# Patient Record
Sex: Male | Born: 1980 | Race: White | Hispanic: No | Smoking: Former smoker
Health system: Southern US, Community
[De-identification: ages and names within clinical notes are randomized; demographics above are authoritative.]

## PROBLEM LIST (undated history)

## (undated) DIAGNOSIS — G8929 Other chronic pain: Secondary | ICD-10-CM

## (undated) DIAGNOSIS — F32A Depression, unspecified: Secondary | ICD-10-CM

## (undated) DIAGNOSIS — E079 Disorder of thyroid, unspecified: Secondary | ICD-10-CM

## (undated) DIAGNOSIS — M25551 Pain in right hip: Secondary | ICD-10-CM

## (undated) DIAGNOSIS — R569 Unspecified convulsions: Secondary | ICD-10-CM

## (undated) HISTORY — DX: Depression, unspecified: F32.A

## (undated) HISTORY — PX: EYE SURGERY: SHX253

---

## 2011-10-30 ENCOUNTER — Inpatient Hospital Stay (HOSPITAL_COMMUNITY)
Admission: EM | Admit: 2011-10-30 | Discharge: 2011-11-02 | DRG: 101 | Disposition: A | Discharge status: Home / Self Care | Payer: MEDICAID | Attending: Internal Medicine | Admitting: Internal Medicine

## 2011-10-30 ENCOUNTER — Emergency Department (HOSPITAL_COMMUNITY): Payer: Self-pay

## 2011-10-30 ENCOUNTER — Encounter (HOSPITAL_COMMUNITY): Payer: Self-pay

## 2011-10-30 DIAGNOSIS — Z560 Unemployment, unspecified: Secondary | ICD-10-CM

## 2011-10-30 DIAGNOSIS — R6889 Other general symptoms and signs: Secondary | ICD-10-CM | POA: Diagnosis present

## 2011-10-30 DIAGNOSIS — T420X1A Poisoning by hydantoin derivatives, accidental (unintentional), initial encounter: Secondary | ICD-10-CM

## 2011-10-30 DIAGNOSIS — E039 Hypothyroidism, unspecified: Secondary | ICD-10-CM

## 2011-10-30 DIAGNOSIS — E109 Type 1 diabetes mellitus without complications: Secondary | ICD-10-CM | POA: Diagnosis present

## 2011-10-30 DIAGNOSIS — E162 Hypoglycemia, unspecified: Secondary | ICD-10-CM | POA: Diagnosis present

## 2011-10-30 DIAGNOSIS — G40802 Other epilepsy, not intractable, without status epilepticus: Principal | ICD-10-CM | POA: Diagnosis present

## 2011-10-30 DIAGNOSIS — E1065 Type 1 diabetes mellitus with hyperglycemia: Secondary | ICD-10-CM | POA: Diagnosis not present

## 2011-10-30 DIAGNOSIS — R569 Unspecified convulsions: Secondary | ICD-10-CM | POA: Diagnosis present

## 2011-10-30 DIAGNOSIS — B353 Tinea pedis: Secondary | ICD-10-CM | POA: Diagnosis present

## 2011-10-30 DIAGNOSIS — T420X5A Adverse effect of hydantoin derivatives, initial encounter: Secondary | ICD-10-CM | POA: Diagnosis present

## 2011-10-30 DIAGNOSIS — Y92009 Unspecified place in unspecified non-institutional (private) residence as the place of occurrence of the external cause: Secondary | ICD-10-CM

## 2011-10-30 DIAGNOSIS — D649 Anemia, unspecified: Secondary | ICD-10-CM | POA: Diagnosis present

## 2011-10-30 DIAGNOSIS — Z87891 Personal history of nicotine dependence: Secondary | ICD-10-CM

## 2011-10-30 DIAGNOSIS — G40309 Generalized idiopathic epilepsy and epileptic syndromes, not intractable, without status epilepticus: Secondary | ICD-10-CM

## 2011-10-30 DIAGNOSIS — IMO0002 Reserved for concepts with insufficient information to code with codable children: Secondary | ICD-10-CM | POA: Diagnosis not present

## 2011-10-30 DIAGNOSIS — Z794 Long term (current) use of insulin: Secondary | ICD-10-CM

## 2011-10-30 HISTORY — DX: Unspecified convulsions: R56.9

## 2011-10-30 HISTORY — DX: Disorder of thyroid, unspecified: E07.9

## 2011-10-30 LAB — BASIC METABOLIC PANEL
Calcium: 9 mg/dL (ref 8.4–10.5)
GFR calc Af Amer: >90 mL/min (ref >90)
GFR calc non Af Amer: >90 mL/min (ref >90)
Sodium: 135 mEq/L (ref 135–145)

## 2011-10-30 LAB — URINALYSIS, ROUTINE W REFLEX MICROSCOPIC
Glucose, UA: >1000 mg/dL — AB
Hgb urine dipstick: NEGATIVE
Specific Gravity, Urine: 1.01 (ref 1.005–1.030)
Urobilinogen, UA: 0.2 mg/dL (ref 0.0–1.0)

## 2011-10-30 LAB — HEPATIC FUNCTION PANEL
Albumin: 4.2 g/dL (ref 3.5–5.2)
Bilirubin, Direct: <0.1 mg/dL (ref 0.0–0.3)
Total Bilirubin: 0.2 mg/dL — ABNORMAL LOW (ref 0.3–1.2)

## 2011-10-30 LAB — GLUCOSE, CAPILLARY: Glucose-Capillary: 199 mg/dL — ABNORMAL HIGH (ref 70–99)

## 2011-10-30 LAB — URINE MICROSCOPIC-ADD ON

## 2011-10-30 LAB — CBC
MCHC: 34.3 g/dL (ref 30.0–36.0)
Platelets: 148 10*3/uL — ABNORMAL LOW (ref 150–400)
RDW: 12.6 % (ref 11.5–15.5)
WBC: 9.3 10*3/uL (ref 4.0–10.5)

## 2011-10-30 LAB — PHENYTOIN LEVEL, TOTAL: Phenytoin Lvl: 39.6 ug/mL (ref 10.0–20.0)

## 2011-10-30 MED ORDER — POTASSIUM CHLORIDE IN NACL 20-0.9 MEQ/L-% IV SOLN
INTRAVENOUS | Status: DC
  Administered 2011-10-30 – 2011-11-02 (×5): via INTRAVENOUS

## 2011-10-30 MED ORDER — CLOTRIMAZOLE 1 % EX CREA
TOPICAL_CREAM | CUTANEOUS | Status: AC
  Filled 2011-10-30: qty 15

## 2011-10-30 MED ORDER — ENOXAPARIN SODIUM 40 MG/0.4ML ~~LOC~~ SOLN
40.0000 mg | SUBCUTANEOUS | Status: DC
  Administered 2011-10-31 – 2011-11-02 (×3): 40 mg via SUBCUTANEOUS
  Filled 2011-10-30 (×3): qty 0.4

## 2011-10-30 MED ORDER — ONDANSETRON HCL 4 MG/2ML IJ SOLN: 4.0000 mg | Freq: Three times a day (TID) | INTRAMUSCULAR | Status: DC | PRN

## 2011-10-30 MED ORDER — LEVOTHYROXINE SODIUM 50 MCG PO TABS
50.0000 ug | ORAL_TABLET | Freq: Every day | ORAL | Status: DC
  Administered 2011-10-31 – 2011-11-01 (×2): 50 ug via ORAL
  Filled 2011-10-30 (×3): qty 1

## 2011-10-30 MED ORDER — BISACODYL 5 MG PO TBEC: 5.0000 mg | DELAYED_RELEASE_TABLET | Freq: Every day | ORAL | Status: DC | PRN

## 2011-10-30 MED ORDER — SODIUM CHLORIDE 0.9 % IV SOLN: INTRAVENOUS | Status: DC

## 2011-10-30 MED ORDER — SODIUM CHLORIDE 0.9 % IV BOLUS (SEPSIS)
1000.0000 mL | Freq: Once | INTRAVENOUS | Status: AC
  Administered 2011-10-30: 1000 mL via INTRAVENOUS

## 2011-10-30 MED ORDER — INSULIN GLARGINE 100 UNIT/ML ~~LOC~~ SOLN
25.0000 [IU] | Freq: Every day | SUBCUTANEOUS | Status: DC
  Administered 2011-10-30: 25 [IU] via SUBCUTANEOUS

## 2011-10-30 MED ORDER — CLOTRIMAZOLE 1 % EX CREA
TOPICAL_CREAM | Freq: Two times a day (BID) | CUTANEOUS | Status: DC
  Administered 2011-10-30 – 2011-11-02 (×6): via TOPICAL
  Filled 2011-10-30: qty 15

## 2011-10-30 MED ORDER — TRAZODONE HCL 50 MG PO TABS: 25.0000 mg | ORAL_TABLET | Freq: Every evening | ORAL | Status: DC | PRN

## 2011-10-30 MED ORDER — ONDANSETRON HCL 4 MG/2ML IJ SOLN: 4.0000 mg | INTRAMUSCULAR | Status: DC | PRN

## 2011-10-30 MED ORDER — ACETAMINOPHEN 325 MG PO TABS: 650.0000 mg | ORAL_TABLET | Freq: Four times a day (QID) | ORAL | Status: DC | PRN

## 2011-10-30 NOTE — ED Notes (Signed)
Per ems, called out for pt having possible seizure. States when they arrived pt was alert ad oriented but his blood sugar was 56. Pt given 25 grams of D50 by EMS. Pt alert and oriented on arrival to ED. Pt does have hx of seizures

## 2011-10-30 NOTE — H&P (Signed)
PCP:  [facility] health department   Chief Complaint:  Recurrent seizures  HPI: Kevin Cooper is an 31 y.o. male.  Diabetes type 1 since age 59, reports adequate control on nightly Lantus, with sliding scale 70/30 insulin; denies hypoglycemic episodes except after a seizure.  Denies family history of seizure disorder, but started having what appeared to be grand mal on April 8 this year, and was admitted total [facility] and evaluated for 3 days including negative CT scan and MRI of brain.   He was discharged on Dilantin 100 mg 3 times daily and followup at the [facility] Department within a couple of weeks and his nighttime dose was increased to 200 mg.Despite receiving a total of 400 mg per 24 hours patient continues to have breakthrough seizures pretty much once per week. Says that family members reports him as going into a stiff arching posture followed by generalized shaking, after the seizures his blood sugars are usually found be low, and he has been told that this is a result of the seizures.  Today family members became concerned because he was not answering his phone, and when they visited his home he was found to be confused and was felt to have had a seizure. He was brought to the emergency room and is now alert and oriented, has no memory of anything that happened today. There is no evidence of intraoral trauma, nor incontinence of urine, but she does have a fresh abrasion of the right proximal forearm  Emergency room his Dilantin level was found to the markedly elevated at 39  He denies headaches or fevers. Denies illicit drug use. 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  As well as diabetes type 1 patient was also diagnosed with hypothyroidism at an early age.   Rewiew of Systems:  The patient denies anorexia, fever, weight loss,, vision loss, decreased hearing, hoarseness, chest pain, syncope, dyspnea on exertion,  peripheral edema, balance deficits, hemoptysis, abdominal pain, melena, hematochezia, severe indigestion/heartburn, hematuria, incontinence, genital sores, muscle weakness, suspicious skin lesions, transient blindness, difficulty walking, depression, unusual weight change, abnormal bleeding, enlarged lymph nodes, angioedema, and breast masses.   Past Medical History  Diagnosis Date  . Seizures   . Thyroid disease   . Diabetes mellitus     History reviewed. No pertinent past surgical history.  Medications:  HOME MEDS: Prior to Admission medications   Medication Sig Start Date End Date Taking? Authorizing Provider  insulin aspart protamine-insulin aspart (NOVOLOG 70/30) (70-30) 100 UNIT/ML injection Inject 10-20 Units into the skin as directed. Per sliding scale instructions   Yes Historical Provider, MD  insulin glargine (LANTUS) 100 UNIT/ML injection Inject 25 Units into the skin at bedtime.   Yes Historical Provider, MD  levothyroxine (SYNTHROID, LEVOTHROID) 50 MCG tablet Take 50 mcg by mouth daily.   Yes Historical Provider, MD  phenytoin (DILANTIN) 100 MG ER capsule Take 100-200 mg by mouth 3 (three) times daily. Take one capsule in the morning and afternoon, then two capsules in the evening   Yes Historical Provider, MD     Allergies:  Allergies  Allergen Reactions  . Penicillins Other (See Comments)    UNKNOWN REACTION  . Tomato Hives, Itching and Rash    Social History:   does not have a smoking history on file. He does not have any smokeless tobacco history on file. He reports that he does not drink alcohol or use illicit drugs.  Family History: No family history on file.   Physical Exam: Filed Vitals:   10/30/11 1731 10/30/11 1736  BP: 129/75   Pulse: 79   Temp: 98.1 F (36.7 C)   TempSrc: Oral   Resp: 20   Height:  5\' 5"  (1.651 m)  Weight:  61.236 kg (135 lb)  SpO2: 100%    Blood pressure 129/75, pulse 79, temperature 98.1 F (36.7 C), temperature source  Oral, resp. rate 20, height 5\' 5"  (1.651 m), weight 61.236 kg (135 lb), SpO2 100.00%.  GEN:  Pleasant young Caucasian gentleman lying in the stretcher in no acute distress; cooperative with exam; looks younger than his stated age; significant large bore piercings of both ears and lip.  PSYCH:  alert and oriented x4; does not appear anxious or depressed; affect is appropriate. HEENT: Mucous membranes pink and anicteric; PERRLA; EOM intact; no cervical lymphadenopathy nor thyromegaly or carotid bruit; no JVD; no Breasts:: Not examined CHEST WALL: No tenderness CHEST: Normal respiration, clear to auscultation bilaterally HEART: Regular rate and rhythm; no murmurs rubs or gallops BACK: No kyphosis or scoliosis; no CVA tenderness ABDOMEN: soft non-tender; no masses, no organomegaly, normal abdominal bowel sounds; no pannus; no intertriginous candida. Rectal Exam: Not done EXTREMITIES: No bone or joint deformity; official abrasion of the right proximal forearm; no edema; no ulcerations. The feet are foul-smelling and dirty, he is wearing sandal and there is evidence of tinea pedis Genitalia: not examined PULSES: 2+ and symmetric SKIN: Normal hydration no rash or ulceration; extensively tattooed CNS: Cranial nerves 2-12 grossly intact no focal lateralizing neurologic deficit   Labs & Imaging Results for orders placed during the hospital encounter of 10/30/11 (from the past 48 hour(s))  BASIC METABOLIC PANEL     Status: Abnormal   Collection Time   10/30/11  5:56 PM      Component Value Range Comment   Sodium 135  135 - 145 (mEq/L)    Potassium 3.9  3.5 - 5.1 (mEq/L)    Chloride 95 (*) 96 - 112 (mEq/L)    CO2 30  19 - 32 (mEq/L)    Glucose, Bld 249 (*) 70 - 99 (mg/dL)    BUN 11  6 - 23 (mg/dL)    Creatinine, Ser 4.78  0.50 - 1.35 (mg/dL)    Calcium 9.0  8.4 - 10.5 (mg/dL)    GFR calc non Af Amer >90  >90 (mL/min)    GFR calc Af Amer >90  >90 (mL/min)   PHENYTOIN LEVEL, TOTAL     Status:  Abnormal   Collection Time   10/30/11  5:56 PM      Component Value Range Comment   Phenytoin Lvl 39.6 (*) 10.0 - 20.0 (ug/mL)   GLUCOSE, CAPILLARY     Status: Abnormal   Collection Time   10/30/11  6:47 PM      Component Value Range Comment   Glucose-Capillary 199 (*) 70 - 99 (mg/dL)    Ct Head Wo Contrast  10/30/2011  *RADIOLOGY REPORT*  Clinical Data: Seizure.  Headache.  CT HEAD WITHOUT CONTRAST  Technique:  Contiguous axial images were obtained from the base of the skull through the vertex without contrast.  Comparison: CT head 09/03/2011.  MRI of the brain 09/04/2011.  Findings: No acute intracranial abnormality.  Specifically, no evidence of acute hemorrhage, no signs of acute/subacute cerebral ischemia, no focal mass, mass effect, hydrocephalus or abnormal intra or extra-axial fluid collections.  No acute displaced skull fractures are identified.  Visualized  paranasal sinuses and mastoids are well pneumatized.  IMPRESSION: 1.  No acute intracranial abnormalities. 2.  The appearance the brain is normal.  Original Report Authenticated By: Florencia Reasons, M.D.      Assessment Present on Admission:  .Convulsions/seizures ? Questionable recent onset epileptic seizures; questionable hypoglycemic seizures.  .Dilantin toxicity .Diabetes type 1, controlled .Hypothyroidism .Tinea pedis   PLAN: We'll admit this gentleman to allow his Dilantin level to normalize, and consult neurologist for alternative management of his seizure disorder.  We'll discontinue 70/30 sliding scale while in hospital, maintained the same dose of evening Lantus, and give sliding scale with meal time coverage to assess his insulin needs. Because of the question of hypoglycemia will consider consulting Dr. Cathlyn Parsons for assistance with management.  Lotrimin for tinea pedis, and explanation on its application Other plans as per orders.   Delorise Hunkele 10/30/2011, 8:37 PM

## 2011-10-30 NOTE — ED Notes (Signed)
CRITICAL VALUE ALERT  Critical value received:  Phenytoin 39.6  Date of notification:  10/30/11  Time of notification:  1847  Critical value read back:yes  Nurse who received alert:  Tarri Glenn RN  MD notified (1st page):  Dr Manus Gunning  Time of first page:  Avon Gully  MD notified (2nd page):  Time of second page:  Responding MD:  Dr Manus Gunning  Time MD responded:  [phone]

## 2011-10-30 NOTE — ED Provider Notes (Signed)
History  This chart was scribed for Kevin Octave, MD by Bennett Scrape. This patient was seen in room [facility] and the patient's care was started at 5:38PM.  CSN: [id number]  Arrival date & time 10/30/11  1730   First MD Initiated Contact with Patient 10/30/11 1738      No chief complaint on file.   The history is provided by the patient. No language interpreter was used.    Kevin Cooper is a 31 y.o. male with a h/o seizures brought in by ambulance, who presents to the Emergency Department complaining of one seizure that occurred approximately 30 minutes PTA. He was given 25 grams of D50 by EMS en route. Pt states that he was at his new house unpacking when he blacked out. He states that he woke up to people "screaming in my face and EMS arriving". His family who witnessed the seizure told him that he became "stiff as a board" and was unresponsive. He reports some mild memory loss stating that the last thing that he remembers is packing up his belongings yesterday. He c/o slight HA but states that he is mostly back to normal. He denies any recent illnesses or changes in medication. Pt reports that his first seizure was on April 8th, 2013. He states that he has been having one seizure every week except for last week when he did not have one. He reports that he takes 100 mg of dilantin once in the morning, 100 mg in the afternoon and 200 mg at night. He states that he has not been seen by a neurologist yet. He has a h/o diabetes and takes insulin. He states that after a seizure his CBG levels drop. His CBG was measured at 56 by EMS. He denies bowel or bladder incontinence, dizziness and nausea as associated symptoms. He denies smoking, illegal drug use and alcohol use.  PCP is the [facility].   Past Medical History  Diagnosis Date  . Seizures   . Thyroid disease   . Diabetes mellitus     History reviewed. No pertinent past surgical history.  No family history on  file.  History  Substance Use Topics  . Smoking status: Not on file  . Smokeless tobacco: Not on file  . Alcohol Use: No      Review of Systems  A complete 10 system review of systems was obtained and all systems are negative except as noted in the HPI and PMH.   Allergies  Penicillins and Tomato  Home Medications   Current Outpatient Rx  Name Route Sig Dispense Refill  . INSULIN ASPART PROT & ASPART (70-30) 100 UNIT/ML [place] SUSP Subcutaneous Inject 10-20 Units into the skin as directed. Per sliding scale instructions    . INSULIN GLARGINE 100 UNIT/ML [place] SOLN Subcutaneous Inject 25 Units into the skin at bedtime.    Marland Kitchen LEVOTHYROXINE SODIUM 50 MCG PO TABS Oral Take 50 mcg by mouth daily.    Marland Kitchen PHENYTOIN SODIUM EXTENDED 100 MG PO CAPS Oral Take 100-200 mg by mouth 3 (three) times daily. Take one capsule in the morning and afternoon, then two capsules in the evening      Triage Vitals: BP 129/75  Pulse 79  Temp(Src) 98.1 F (36.7 C) (Oral)  Resp 20  Ht 5\' 5"  (1.651 m)  Wt 135 lb (61.236 kg)  BMI 22.47 kg/m2  SpO2 100%  Physical Exam  Nursing note and vitals reviewed. Constitutional: He is oriented to person, place, and time. He  appears well-developed and well-nourished. No distress.  HENT:  Head: Normocephalic and atraumatic.       No tongue biting   Eyes: Conjunctivae and EOM are normal.  Neck: Neck supple. No tracheal deviation present.  Cardiovascular: Normal rate and regular rhythm.   Pulmonary/Chest: Effort normal and breath sounds normal. No respiratory distress.  Genitourinary:       No incontinence   Musculoskeletal: Normal range of motion.  Neurological: He is alert and oriented to person, place, and time.       No focal deficits  Skin: Skin is warm and dry.  Psychiatric: He has a normal mood and affect. His behavior is normal.    ED Course  Procedures (including critical care time)  DIAGNOSTIC STUDIES: Oxygen Saturation is 100% on room air, normal by my  interpretation.    COORDINATION OF CARE: 5:51PM-Discussed treatment plan with pt and pt agreed to plan. 7:00PM-Informed pt of Dilantin level. Discussed plan to consult with neurologist to determine best course of action.  7:40PM-Informed pt of consult with neurologist. Discussed admission with pt and pt agreed to admission.  Labs Reviewed  BASIC METABOLIC PANEL - Abnormal; Notable for the following:    Chloride 95 (*)    Glucose, Bld 249 (*)    All other components within normal limits  PHENYTOIN LEVEL, TOTAL - Abnormal; Notable for the following:    Phenytoin Lvl 39.6 (*)    All other components within normal limits  GLUCOSE, CAPILLARY - Abnormal; Notable for the following:    Glucose-Capillary 199 (*)    All other components within normal limits  URINE RAPID DRUG SCREEN (HOSP PERFORMED)  URINALYSIS, ROUTINE W REFLEX MICROSCOPIC   Ct Head Wo Contrast  10/30/2011  *RADIOLOGY REPORT*  Clinical Data: Seizure.  Headache.  CT HEAD WITHOUT CONTRAST  Technique:  Contiguous axial images were obtained from the base of the skull through the vertex without contrast.  Comparison: CT head 09/03/2011.  MRI of the brain 09/04/2011.  Findings: No acute intracranial abnormality.  Specifically, no evidence of acute hemorrhage, no signs of acute/subacute cerebral ischemia, no focal mass, mass effect, hydrocephalus or abnormal intra or extra-axial fluid collections.  No acute displaced skull fractures are identified.  Visualized paranasal sinuses and mastoids are well pneumatized.  IMPRESSION: 1.  No acute intracranial abnormalities. 2.  The appearance the brain is normal.  Original Report Authenticated By: Kevin Cooper, M.D.     No diagnosis found.    MDM  History of diabetes and questionable seizure disorder presenting after possible seizure at home. Awake and alert on EMS arrival but hypoglycemic and 56. States compliance with his Dilantin. Has not seen a neurologist but is going to health  department. No tongue biting, no incontinence, no apparent postictal period. Amnestic to the event.  CT negative. Dilantin super therapeutic at 39.6. D/w poison center.  Dosing appears to be appropriate 100 mg, 100mg , 200 mg.  Patient states compliance.  Recommends neurology consult, supportive care and monitoring. D/w Dr. [name].  Recommends hospitalist admission and holding dilantin.  He will consult in AM.   Date: 10/30/2011  Rate: 94  Rhythm: normal sinus rhythm  QRS Axis: normal  Intervals: normal  ST/T Wave abnormalities: normal  Conduction Disutrbances:none  Narrative Interpretation: no prolonged QT  Old EKG Reviewed: none available  CRITICAL CARE Performed by: Kevin Cooper   Total critical care time: 30  Critical care time was exclusive of separately billable procedures and treating other patients.  Critical care was  necessary to treat or prevent imminent or life-threatening deterioration.  Critical care was time spent personally by me on the following activities: development of treatment plan with patient and/or surrogate as well as nursing, discussions with consultants, evaluation of patient's response to treatment, examination of patient, obtaining history from patient or surrogate, ordering and performing treatments and interventions, ordering and review of laboratory studies, ordering and review of radiographic studies, pulse oximetry and re-evaluation of patient's condition.     I personally performed the services described in this documentation, which was scribed in my presence.  The recorded information has been reviewed and considered.    Kevin Octave, MD 10/30/11 [phone]

## 2011-10-30 NOTE — ED Notes (Signed)
Pt return from CT. Eating supper. Pt aware he needs to obtain urine spec

## 2011-10-31 DIAGNOSIS — E109 Type 1 diabetes mellitus without complications: Secondary | ICD-10-CM

## 2011-10-31 DIAGNOSIS — E162 Hypoglycemia, unspecified: Secondary | ICD-10-CM | POA: Diagnosis present

## 2011-10-31 DIAGNOSIS — G40309 Generalized idiopathic epilepsy and epileptic syndromes, not intractable, without status epilepticus: Secondary | ICD-10-CM

## 2011-10-31 DIAGNOSIS — T420X1A Poisoning by hydantoin derivatives, accidental (unintentional), initial encounter: Secondary | ICD-10-CM

## 2011-10-31 LAB — TSH: TSH: 32.191 u[IU]/mL — ABNORMAL HIGH (ref 0.350–4.500)

## 2011-10-31 LAB — HEMOGLOBIN A1C: Hgb A1c MFr Bld: 7.6 % — ABNORMAL HIGH (ref <5.7)

## 2011-10-31 LAB — RAPID URINE DRUG SCREEN, HOSP PERFORMED
Opiates: NOT DETECTED
Tetrahydrocannabinol: NOT DETECTED

## 2011-10-31 LAB — BASIC METABOLIC PANEL
GFR calc Af Amer: >90 mL/min (ref >90)
GFR calc non Af Amer: >90 mL/min (ref >90)
Potassium: 3.6 mEq/L (ref 3.5–5.1)
Sodium: 140 mEq/L (ref 135–145)

## 2011-10-31 LAB — CBC
MCH: 30.1 pg (ref 26.0–34.0)
MCHC: 34 g/dL (ref 30.0–36.0)
Platelets: 159 10*3/uL (ref 150–400)

## 2011-10-31 LAB — GLUCOSE, CAPILLARY
Glucose-Capillary: 79 mg/dL (ref 70–99)
Glucose-Capillary: 126 mg/dL — ABNORMAL HIGH (ref 70–99)
Glucose-Capillary: 39 mg/dL — CL (ref 70–99)
Glucose-Capillary: 282 mg/dL — ABNORMAL HIGH (ref 70–99)

## 2011-10-31 LAB — PHENYTOIN LEVEL, TOTAL: Phenytoin Lvl: 32.1 ug/mL (ref 10.0–20.0)

## 2011-10-31 MED ORDER — INSULIN GLARGINE 100 UNIT/ML ~~LOC~~ SOLN
15.0000 [IU] | Freq: Every day | SUBCUTANEOUS | Status: DC
  Administered 2011-10-31: 15 [IU] via SUBCUTANEOUS

## 2011-10-31 MED ORDER — INSULIN ASPART 100 UNIT/ML ~~LOC~~ SOLN
0.0000 [IU] | Freq: Three times a day (TID) | SUBCUTANEOUS | Status: DC
  Administered 2011-10-31: 5 [IU] via SUBCUTANEOUS
  Administered 2011-11-01: 9 [IU] via SUBCUTANEOUS
  Administered 2011-11-01: 2 [IU] via SUBCUTANEOUS
  Administered 2011-11-02: 5 [IU] via SUBCUTANEOUS
  Administered 2011-11-02: 2 [IU] via SUBCUTANEOUS

## 2011-10-31 MED ORDER — INSULIN ASPART 100 UNIT/ML ~~LOC~~ SOLN
5.0000 [IU] | Freq: Three times a day (TID) | SUBCUTANEOUS | Status: DC
  Administered 2011-11-01 – 2011-11-02 (×5): 5 [IU] via SUBCUTANEOUS

## 2011-10-31 MED ORDER — GLUCOSE 40 % PO GEL
ORAL | Status: AC
  Administered 2011-10-31: 37.5 g
  Filled 2011-10-31: qty 0.83

## 2011-10-31 MED ORDER — INSULIN GLARGINE 100 UNIT/ML ~~LOC~~ SOLN: 10.0000 [IU] | Freq: Every day | SUBCUTANEOUS | Status: DC

## 2011-10-31 MED ORDER — GLUCOSE 40 % PO GEL
ORAL | Status: AC
  Administered 2011-10-31: 37.5 g via ORAL
  Filled 2011-10-31: qty 0.83

## 2011-10-31 MED ORDER — INSULIN ASPART 100 UNIT/ML ~~LOC~~ SOLN
4.0000 [IU] | Freq: Three times a day (TID) | SUBCUTANEOUS | Status: DC
Start: 2011-10-31 — End: 2011-10-31
  Administered 2011-10-31: 4 [IU] via SUBCUTANEOUS

## 2011-10-31 MED ORDER — SODIUM CHLORIDE 0.9 % IJ SOLN
INTRAMUSCULAR | Status: AC
  Filled 2011-10-31: qty 3

## 2011-10-31 MED ORDER — INSULIN ASPART 100 UNIT/ML ~~LOC~~ SOLN: 0.0000 [IU] | Freq: Every day | SUBCUTANEOUS | Status: DC

## 2011-10-31 MED ORDER — GLUCOSE 40 % PO GEL
1.0000 | ORAL | Status: DC | PRN
  Administered 2011-11-02: 37.5 g via ORAL
  Filled 2011-10-31: qty 0.83

## 2011-10-31 MED ORDER — GLUCOSE 40 % PO GEL
1.0000 | ORAL | Status: DC | PRN
  Administered 2011-10-31: 37.5 g via ORAL

## 2011-10-31 NOTE — Consult Note (Signed)
Kevin Cooper, Kevin Cooper                 ACCOUNT NO.:  [id number]  MEDICAL RECORD NO.:  [id number]  LOCATION:  A340                          FACILITY:  [facility]  PHYSICIAN:  Avanthika Dehnert A. Kevin Cooper, M.D. DATE OF BIRTH:  May 30, 1980  DATE OF CONSULTATION: DATE OF DISCHARGE:                                CONSULTATION   REASON FOR CONSULTATION:  Seizure.  HISTORY OF PRESENT ILLNESS:  The patient is a 31 year old white male who has a history of seizures diagnosed about 3 months ago.  He in fact presented with new onset of seizure in early April at [facility], he was found by his girlfriend to be unresponsive, stiffening, and shaking on the ground, he was incontinent of fecal and urinary material. The patient did become more lucid, but was confused and disoriented.  He was taken to the hospital at [facility] and had a workup that included a head CT and MRI scan.  The CT was negative.  MRI showed, question about abnormality involving the left hippocampal area.  EEG was not available, so he was sent to [facility] to have an EEG done, but he did not have it because of being unsure.  The patient was also diagnosed as having thyroid abnormality/hypothyroidism.  Workup at [facility] also included an echo of carotids, which showed no hemodynamic significant stenosis and the echo was normal showing ejection fraction of about 50%-60%.  The patient has been started on Dilantin, unfortunately, he had breakthrough seizures once a week on the medications.  He has been followed at the health department and his dose was subsequently increased from 300 mg a day to 400 mg, and unfortunately, he continued to have seizures.  He presented to the emergency room yesterday after seizure was noted they have supratherapeutic Dilantin level of 39, and was hence admitted.  The patient does have a history of head injury at least twice at the age of 78, one was related to hypoglycemic episode where he sustained a concussion in the  frontal part of his head on his right side.  There is no history of prematurity.  There is no history of CNS infection or family history of seizures.  PHYSICAL EXAMINATION:  GENERAL:  Pleasant thin man, in no acute distress. HEENT:  Neck is supple.  Head is normocephalic, atraumatic.  There is no bruises or tongue bite seen.  He does not report history of oral trauma with the seizures. EXTREMITIES:  Shows significant tattooing of the upper extremities, otherwise, normal.  Legs are normal. ABDOMEN:  Soft. MENTATION:  He is awake, alert.  He is lucid and coherent.  Speech, language, and cognition are intact. CRANIAL NERVES:  Evaluation shows full extraocular movements.  I believe that he had sustained end-gaze nystagmus bilaterally that is torsional in nature.  Pupils are equal, round, and reactive.  Visual fields are intact.  Facial muscle strength is symmetric.  Tongue is midline.  Uvula midline.  Shoulder shrugs normal.  Motor examination shows normal tone, bulk, and strength.  There is no pronator drift.  Coordination shows no dysmetria.  Finger-to-nose is normal.  Reflexes are 2+.  Plantar both downgoing.  Sensation normal to pain and light touch.  Gait is normal.  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  It is unclear if this is a generalized epileptic syndrome or partially with rapid secondary generalization. The MRI would suggest a focal seizure with secondary generalization, possibly starting at the left temple area, we need to sort this out however.  In the meantime, I think we will continue with monitoring the patient and hold his Dilantin.  Depending on the EEG findings, the patient will be placed on seizure medications for partial seizures, preferably Keppra.  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  Thanks for this consultation.     Kevin Cooper A. Kevin Cooper,  M.D.     KAD/MEDQ  D:  10/31/2011  T:  10/31/2011  Job:  [id number]

## 2011-10-31 NOTE — Consult Note (Signed)
Reason for Consult: Referring Physician:   Doss Cybulski is an 31 y.o. male.  HPI:   Past Medical History  Diagnosis Date  . Seizures   . Thyroid disease   . Diabetes mellitus     History reviewed. No pertinent past surgical history.  No family history on file.  Social History:  reports that he has quit smoking. He has quit using smokeless tobacco. He reports that he does not drink alcohol or use illicit drugs.  Allergies:  Allergies  Allergen Reactions  . Penicillins Other (See Comments)    UNKNOWN REACTION  . Tomato Hives, Itching and Rash    Medications:  Prior to Admission medications   Medication Sig Start Date End Date Taking? Authorizing Provider  insulin aspart protamine-insulin aspart (NOVOLOG 70/30) (70-30) 100 UNIT/ML injection Inject 10-20 Units into the skin as directed. Per sliding scale instructions   Yes Historical Provider, MD  insulin glargine (LANTUS) 100 UNIT/ML injection Inject 25 Units into the skin at bedtime.   Yes Historical Provider, MD  levothyroxine (SYNTHROID, LEVOTHROID) 50 MCG tablet Take 50 mcg by mouth daily.   Yes Historical Provider, MD  phenytoin (DILANTIN) 100 MG ER capsule Take 100-200 mg by mouth 3 (three) times daily. Take one capsule in the morning and afternoon, then two capsules in the evening   Yes Historical Provider, MD    Scheduled Meds:   . clotrimazole   Topical BID  . dextrose      . enoxaparin  40 mg Subcutaneous Q24H  . insulin aspart  0-9 Units Subcutaneous TID WC  . insulin aspart  5 Units Subcutaneous TID WC  . insulin glargine  15 Units Subcutaneous QHS  . levothyroxine  50 mcg Oral Daily  . sodium chloride  1,000 mL Intravenous Once  . DISCONTD: sodium chloride   Intravenous STAT  . DISCONTD: insulin aspart  0-5 Units Subcutaneous QHS  . DISCONTD: insulin aspart  4 Units Subcutaneous TID WC  . DISCONTD: insulin glargine  10 Units Subcutaneous QHS  . DISCONTD: insulin glargine  25 Units Subcutaneous QHS    Continuous Infusions:   . 0.9 % NaCl with KCl 20 mEq / L 75 mL/hr at 10/31/11 1042   PRN Meds:.acetaminophen, bisacodyl, dextrose, dextrose, ondansetron (ZOFRAN) IV, traZODone, DISCONTD: ondansetron (ZOFRAN) IV   Results for orders placed during the hospital encounter of 10/30/11 (from the past 48 hour(s))  BASIC METABOLIC PANEL     Status: Abnormal   Collection Time   10/30/11  5:56 PM      Component Value Range Comment   Sodium 135  135 - 145 (mEq/L)    Potassium 3.9  3.5 - 5.1 (mEq/L)    Chloride 95 (*) 96 - 112 (mEq/L)    CO2 30  19 - 32 (mEq/L)    Glucose, Bld 249 (*) 70 - 99 (mg/dL)    BUN 11  6 - 23 (mg/dL)    Creatinine, Ser 1.61  0.50 - 1.35 (mg/dL)    Calcium 9.0  8.4 - 10.5 (mg/dL)    GFR calc non Af Amer >90  >90 (mL/min)    GFR calc Af Amer >90  >90 (mL/min)   PHENYTOIN LEVEL, TOTAL     Status: Abnormal   Collection Time   10/30/11  5:56 PM      Component Value Range Comment   Phenytoin Lvl 39.6 (*) 10.0 - 20.0 (ug/mL)   TSH     Status: Abnormal   Collection Time   10/30/11  5:56 PM      Component Value Range Comment   TSH 32.191 (*) 0.350 - 4.500 (uIU/mL)   HEMOGLOBIN A1C     Status: Abnormal   Collection Time   10/30/11  5:56 PM      Component Value Range Comment   Hemoglobin A1C 7.6 (*) <5.7 (%)    Mean Plasma Glucose 171 (*) <117 (mg/dL)   CBC     Status: Abnormal   Collection Time   10/30/11  5:56 PM      Component Value Range Comment   WBC 9.3  4.0 - 10.5 (K/uL)    RBC 4.48  4.22 - 5.81 (MIL/uL)    Hemoglobin 13.6  13.0 - 17.0 (g/dL)    HCT 16.1  09.6 - 04.5 (%)    MCV 88.4  78.0 - 100.0 (fL)    MCH 30.4  26.0 - 34.0 (pg)    MCHC 34.3  30.0 - 36.0 (g/dL)    RDW 40.9  81.1 - 91.4 (%)    Platelets 148 (*) 150 - 400 (K/uL)   GLUCOSE, CAPILLARY     Status: Abnormal   Collection Time   10/30/11  6:47 PM      Component Value Range Comment   Glucose-Capillary 199 (*) 70 - 99 (mg/dL)   HEPATIC FUNCTION PANEL     Status: Abnormal   Collection Time   10/30/11   8:32 PM      Component Value Range Comment   Total Protein 7.3  6.0 - 8.3 (g/dL)    Albumin 4.2  3.5 - 5.2 (g/dL)    AST 22  0 - 37 (U/L)    ALT 12  0 - 53 (U/L)    Alkaline Phosphatase 164 (*) 39 - 117 (U/L)    Total Bilirubin 0.2 (*) 0.3 - 1.2 (mg/dL)    Bilirubin, Direct <7.8  0.0 - 0.3 (mg/dL)    Indirect Bilirubin NOT CALCULATED  0.3 - 0.9 (mg/dL)   GLUCOSE, CAPILLARY     Status: Abnormal   Collection Time   10/30/11  9:00 PM      Component Value Range Comment   Glucose-Capillary 275 (*) 70 - 99 (mg/dL)    Comment 1 Documented in Chart      Comment 2 Notify RN     URINE RAPID DRUG SCREEN (HOSP PERFORMED)     Status: Normal   Collection Time   10/30/11 11:28 PM      Component Value Range Comment   Opiates NONE DETECTED  NONE DETECTED     Cocaine NONE DETECTED  NONE DETECTED     Benzodiazepines NONE DETECTED  NONE DETECTED     Amphetamines NONE DETECTED  NONE DETECTED     Tetrahydrocannabinol NONE DETECTED  NONE DETECTED     Barbiturates NONE DETECTED  NONE DETECTED    URINALYSIS, ROUTINE W REFLEX MICROSCOPIC     Status: Abnormal   Collection Time   10/30/11 11:28 PM      Component Value Range Comment   Color, Urine YELLOW  YELLOW     APPearance CLEAR  CLEAR     Specific Gravity, Urine 1.010  1.005 - 1.030     pH 5.5  5.0 - 8.0     Glucose, UA >1000 (*) NEGATIVE (mg/dL)    Hgb urine dipstick NEGATIVE  NEGATIVE     Bilirubin Urine NEGATIVE  NEGATIVE     Ketones, ur TRACE (*) NEGATIVE (mg/dL)    Protein, ur NEGATIVE  NEGATIVE (  mg/dL)    Urobilinogen, UA 0.2  0.0 - 1.0 (mg/dL)    Nitrite NEGATIVE  NEGATIVE     Leukocytes, UA NEGATIVE  NEGATIVE    URINE MICROSCOPIC-ADD ON     Status: Normal   Collection Time   10/30/11 11:28 PM      Component Value Range Comment   Squamous Epithelial / LPF RARE  RARE     WBC, UA 0-2  <3 (WBC/hpf)    Bacteria, UA RARE  RARE    BASIC METABOLIC PANEL     Status: Abnormal   Collection Time   10/31/11  5:04 AM      Component Value Range Comment     Sodium 140  135 - 145 (mEq/L)    Potassium 3.6  3.5 - 5.1 (mEq/L)    Chloride 103  96 - 112 (mEq/L) DELTA CHECK NOTED   CO2 30  19 - 32 (mEq/L)    Glucose, Bld 107 (*) 70 - 99 (mg/dL)    BUN 6  6 - 23 (mg/dL)    Creatinine, Ser 1.61  0.50 - 1.35 (mg/dL)    Calcium 8.5  8.4 - 10.5 (mg/dL)    GFR calc non Af Amer >90  >90 (mL/min)    GFR calc Af Amer >90  >90 (mL/min)   PHENYTOIN LEVEL, TOTAL     Status: Abnormal   Collection Time   10/31/11  5:04 AM      Component Value Range Comment   Phenytoin Lvl 32.1 (*) 10.0 - 20.0 (ug/mL)   CBC     Status: Abnormal   Collection Time   10/31/11  5:04 AM      Component Value Range Comment   WBC 9.4  4.0 - 10.5 (K/uL)    RBC 4.35  4.22 - 5.81 (MIL/uL)    Hemoglobin 13.1  13.0 - 17.0 (g/dL)    HCT 09.6 (*) 04.5 - 52.0 (%)    MCV 88.5  78.0 - 100.0 (fL)    MCH 30.1  26.0 - 34.0 (pg)    MCHC 34.0  30.0 - 36.0 (g/dL)    RDW 40.9  81.1 - 91.4 (%)    Platelets 159  150 - 400 (K/uL)   GLUCOSE, CAPILLARY     Status: Abnormal   Collection Time   10/31/11  7:14 AM      Component Value Range Comment   Glucose-Capillary 69 (*) 70 - 99 (mg/dL)    Comment 1 Notify RN     GLUCOSE, CAPILLARY     Status: Normal   Collection Time   10/31/11  7:38 AM      Component Value Range Comment   Glucose-Capillary 79  70 - 99 (mg/dL)    Comment 1 Notify RN     GLUCOSE, CAPILLARY     Status: Abnormal   Collection Time   10/31/11 11:11 AM      Component Value Range Comment   Glucose-Capillary 39 (*) 70 - 99 (mg/dL)    Comment 1 Notify RN     GLUCOSE, CAPILLARY     Status: Abnormal   Collection Time   10/31/11 11:33 AM      Component Value Range Comment   Glucose-Capillary 34 (*) 70 - 99 (mg/dL)    Comment 1 Notify RN     GLUCOSE, CAPILLARY     Status: Abnormal   Collection Time   10/31/11 11:36 AM      Component Value Range Comment   Glucose-Capillary 39 (*)  70 - 99 (mg/dL)    Comment 1 Notify RN     GLUCOSE, CAPILLARY     Status: Abnormal   Collection Time    10/31/11 11:57 AM      Component Value Range Comment   Glucose-Capillary 51 (*) 70 - 99 (mg/dL)    Comment 1 Documented in Chart      Comment 2 Notify RN     GLUCOSE, CAPILLARY     Status: Abnormal   Collection Time   10/31/11 12:25 PM      Component Value Range Comment   Glucose-Capillary 126 (*) 70 - 99 (mg/dL)    Comment 1 Notify RN     GLUCOSE, CAPILLARY     Status: Abnormal   Collection Time   10/31/11  4:13 PM      Component Value Range Comment   Glucose-Capillary 282 (*) 70 - 99 (mg/dL)     Ct Head Wo Contrast  10/30/2011  *RADIOLOGY REPORT*  Clinical Data: Seizure.  Headache.  CT HEAD WITHOUT CONTRAST  Technique:  Contiguous axial images were obtained from the base of the skull through the vertex without contrast.  Comparison: CT head 09/03/2011.  MRI of the brain 09/04/2011.  Findings: No acute intracranial abnormality.  Specifically, no evidence of acute hemorrhage, no signs of acute/subacute cerebral ischemia, no focal mass, mass effect, hydrocephalus or abnormal intra or extra-axial fluid collections.  No acute displaced skull fractures are identified.  Visualized paranasal sinuses and mastoids are well pneumatized.  IMPRESSION: 1.  No acute intracranial abnormalities. 2.  The appearance the brain is normal.  Original Report Authenticated By: Florencia Reasons, M.D.    Review of Systems  Constitutional: Negative.   Eyes: Negative.   Respiratory: Negative.   Cardiovascular: Negative.   Psychiatric/Behavioral: Negative.    Blood pressure 100/65, pulse 91, temperature 98.2 F (36.8 C), temperature source Oral, resp. rate 18, height 5\' 5"  (1.651 m), weight 59.1 kg (130 lb 4.7 oz), SpO2 95.00%. Physical Exam  Assessment/Plan: See dict  Tashaya Ancrum 10/31/2011, 7:12 PM

## 2011-10-31 NOTE — Progress Notes (Signed)
Patient ID: Kevin Cooper, male   DOB: Oct 01, 1980, 31 y.o.   MRN: [id number]

## 2011-10-31 NOTE — Progress Notes (Signed)
UR Chart Review Completed  

## 2011-10-31 NOTE — Consult Note (Signed)
Kevin Cooper, Kevin Cooper                 ACCOUNT NO.:  [id number]  MEDICAL RECORD NO.:  [id number]  LOCATION:  A340                          FACILITY:  [facility]  PHYSICIAN:  Purcell Nails, MD DATE OF BIRTH:  01-31-1981  DATE OF CONSULTATION:  10/31/2011 DATE OF DISCHARGE:                                CONSULTATION   REASON FOR CONSULT:  Uncontrolled type 1 diabetes.  HISTORY OF PRESENT ILLNESS:  This is a 31 year old gentleman with type 1 diabetes since 50.  He also has hypothyroidism on replacement.  He was recently diagnosed with a seizure disorder and was reportedly put on Dilantin.  Since that time, he had episodes of hypoglycemia alternating with hyperglycemia.  He denies any prior history of DKA.  He is taking Lantus up to 20 units at night and NovoLog 70/30 sliding scale, averaging 10-12 units per meal time.  His recent A1c was 7.6% today.  In hospital, he run mostly hypoglycemia to tight glycemic range until this afternoon when his blood glucose went up to 282.  He is eating his meals completely.  He has difficulty with insurance for his doctor's visit and medication coverage.  He goes to [facility] for his medical care.  At emergency room, he was found to have high Dilantin level of 39.  PAST MEDICAL HISTORY:  Significant for seizure disorder, type 1 diabetes, hypothyroidism.  SURGICAL HISTORY:  Unremarkable.  HOME MEDICATIONS:  Include NovoLog 70/30, insulin glargine, levothyroxine, and phenytoin 100 mg 3 times daily.  ALLERGIC:  Penicillin and tomato.  SOCIAL HISTORY:  Negative for smoking, alcohol, or drug use.  Extensive body tattoo.  FAMILY HISTORY:  Unremarkable.  REVIEW OF SYSTEMS:  Negative except those mentioned in HPI.  Denies any nausea or vomiting.  No bleeding, no headache.  No significant weight change.  PHYSICAL EXAMINATION:  GENERAL:  He is alert and oriented x3, resting in hospital bed comfortably.  He is on IV fluid, ready  to eat his dinner. VITAL SIGNS:  Blood pressure is 100/65, pulse rate 91, temperature 98.2. HEENT:  Metallic lip pierce, extensive tattoo over his body.  No thyromegaly on the neck exam. CHEST:  Clear to auscultation bilaterally. CARDIOVASCULAR:  Normal S1, S2.  No murmur.  No gallop. ABDOMEN:  Soft and nontender. EXTREMITIES:  No edema.  Poor hygiene on feet.  LABORATORY DATA:  His recent labs showed sodium 140, potassium 3.6, chloride 103, bicarb 30, BUN 6, creatinine 0.6.  Hemoglobin A1c 7.6% on October 30, 2011.  ASSESSMENT: 1. Type 1 diabetes, fairly controlled as an outpatient with A1c of     7.6%. 2. Fluctuant glycemic profile evident since initiation of antiseizure     therapy. 3. Hypothyroidism. 4. Seizure disorder. 5. Poor medical insurance coverage preventing him from getting proper     outpatient medical care.  PLAN:  Since he is reversing his glycemia to hyperglycemia range, I will increase his Lantus to 15 units at bedtime.  I will introduce NovoLog 5 units t.i.d. a.c. plus coverage with low-dose sliding scale.  I will stop his bedtime NovoLog.  He is encouraged to apply for medical insurance.  As an outpatient, he is advised  not to use Lantus and NovoLog 70/30 together since this is a duplicate of long-acting insulin, instead of the NovoLog 70/30, I advised him to use plain NovoLog, Humalog, or Apidra.  I will see him tomorrow afternoon to re-evaluate the set of current insulin regimen.  Dear Dr. Lendell Caprice, thank you for the opportunity to participate in the care of this pleasant patient.          ______________________________ Purcell Nails, MD     GN/MEDQ  D:  10/31/2011  T:  10/31/2011  Job:  [id number]

## 2011-10-31 NOTE — Clinical Social Work Note (Signed)
CSW received referral for assistance with medications. CM aware and to follow. CSW will sign off unless further needs arise.    Derenda Fennel, [place] [phone]

## 2011-10-31 NOTE — Progress Notes (Signed)
Chart reviewed.  Subjective: No new complaints. Patient reports that he rarely had hypoglycemic episodes until he was started on Dilantin. He reports that his hypoglycemic episodes usually occur after a seizure. He has been having difficulty walking for about a week now. He has not seen an endocrinologist in quite some time. He apparently had an appointment with a neurologist at [facility], but was unable to afford the testing, and no medication changes were made.  Objective: Vital signs in last 24 hours: Filed Vitals:   10/31/11 0509 10/31/11 0550 10/31/11 1021 10/31/11 1344  BP:  105/66 98/63 100/65  Pulse:  90 89 91  Temp:  98.6 F (37 C) 97.9 F (36.6 C) 98.2 F (36.8 C)  TempSrc:  Oral    Resp:  20 18 18   Height:      Weight: 59.1 kg (130 lb 4.7 oz)     SpO2:  97% 100% 95%   Weight change:   Intake/Output Summary (Last 24 hours) at 10/31/11 1542 Last data filed at 10/31/11 0758  Gross per 24 hour  Intake  972.5 ml  Output   1000 ml  Net  -27.5 ml   General: Alert oriented appropriate Lungs clear to auscultation bilaterally without wheeze rhonchi or rales Cardiovascular regular rate rhythm without murmurs gallops rubs Abdomen soft nontender nondistended Extremities no clubbing cyanosis or edema  Lab Results: Basic Metabolic Panel:  Lab 10/31/11 9604 10/30/11 1756  NA 140 135  K 3.6 3.9  CL 103 95*  CO2 30 30  GLUCOSE 107* 249*  BUN 6 11  CREATININE 0.66 0.76  CALCIUM 8.5 9.0  MG -- --  PHOS -- --   Liver Function Tests:  Lab 10/30/11 2032  AST 22  ALT 12  ALKPHOS 164*  BILITOT 0.2*  PROT 7.3  ALBUMIN 4.2   No results found for this basename: LIPASE:2,AMYLASE:2 in the last 168 hours No results found for this basename: AMMONIA:2 in the last 168 hours CBC:  Lab 10/31/11 0504 10/30/11 1756  WBC 9.4 9.3  NEUTROABS -- --  HGB 13.1 13.6  HCT 38.5* 39.6  MCV 88.5 88.4  PLT 159 148*   Cardiac Enzymes: No results found for this basename:  CKTOTAL:3,CKMB:3,CKMBINDEX:3,TROPONINI:3 in the last 168 hours BNP: No results found for this basename: PROBNP:3 in the last 168 hours D-Dimer: No results found for this basename: DDIMER:2 in the last 168 hours CBG:  Lab 10/31/11 1225 10/31/11 1157 10/31/11 1136 10/31/11 1133 10/31/11 1111 10/31/11 0738  GLUCAP 126* 51* 39* 34* 39* 79   Hemoglobin A1C: No results found for this basename: HGBA1C in the last 168 hours Fasting Lipid Panel: No results found for this basename: CHOL,HDL,LDLCALC,TRIG,CHOLHDL,LDLDIRECT in the last 540 hours Thyroid Function Tests:  Lab 10/30/11 1756  TSH 32.191*  T4TOTAL --  FREET4 --  T3FREE --  THYROIDAB --   Coagulation: No results found for this basename: LABPROT:4,INR:4 in the last 168 hours Anemia Panel: No results found for this basename: VITAMINB12,FOLATE,FERRITIN,TIBC,IRON,RETICCTPCT in the last 168 hours Urine Drug Screen: Drugs of Abuse     Component Value Date/Time   LABOPIA NONE DETECTED 10/30/2011 2328   COCAINSCRNUR NONE DETECTED 10/30/2011 2328   LABBENZ NONE DETECTED 10/30/2011 2328   AMPHETMU NONE DETECTED 10/30/2011 2328   THCU NONE DETECTED 10/30/2011 2328   LABBARB NONE DETECTED 10/30/2011 2328    Alcohol Level: No results found for this basename: ETH:2 in the last 168 hours Urinalysis:  Lab 10/30/11 2328  COLORURINE YELLOW  LABSPEC 1.010  PHURINE 5.5  GLUCOSEU >1000*  HGBUR NEGATIVE  BILIRUBINUR NEGATIVE  KETONESUR TRACE*  PROTEINUR NEGATIVE  UROBILINOGEN 0.2  NITRITE NEGATIVE  LEUKOCYTESUR NEGATIVE   Micro Results: No results found for this or any previous visit (from the past 240 hour(s)). Studies/Results: Ct Head Wo Contrast  10/30/2011  *RADIOLOGY REPORT*  Clinical Data: Seizure.  Headache.  CT HEAD WITHOUT CONTRAST  Technique:  Contiguous axial images were obtained from the base of the skull through the vertex without contrast.  Comparison: CT head 09/03/2011.  MRI of the brain 09/04/2011.  Findings: No acute  intracranial abnormality.  Specifically, no evidence of acute hemorrhage, no signs of acute/subacute cerebral ischemia, no focal mass, mass effect, hydrocephalus or abnormal intra or extra-axial fluid collections.  No acute displaced skull fractures are identified.  Visualized paranasal sinuses and mastoids are well pneumatized.  IMPRESSION: 1.  No acute intracranial abnormalities. 2.  The appearance the brain is normal.  Original Report Authenticated By: Florencia Reasons, M.D.   Scheduled Meds:   . clotrimazole   Topical BID  . dextrose      . enoxaparin  40 mg Subcutaneous Q24H  . insulin aspart  0-5 Units Subcutaneous QHS  . insulin aspart  0-9 Units Subcutaneous TID WC  . insulin glargine  10 Units Subcutaneous QHS  . levothyroxine  50 mcg Oral Daily  . sodium chloride  1,000 mL Intravenous Once  . DISCONTD: sodium chloride   Intravenous STAT  . DISCONTD: insulin aspart  4 Units Subcutaneous TID WC  . DISCONTD: insulin glargine  25 Units Subcutaneous QHS   Continuous Infusions:   . 0.9 % NaCl with KCl 20 mEq / Cooper 75 mL/hr at 10/31/11 1042   PRN Meds:.acetaminophen, bisacodyl, dextrose, dextrose, ondansetron (ZOFRAN) IV, traZODone, DISCONTD: ondansetron (ZOFRAN) IV Assessment/Plan: Principal Problem:  *Dilantin toxicity Active Problems:  Convulsions/seizures  DM (diabetes mellitus), type 1  Hypoglycemia  Hypothyroidism  Tinea pedis  Patient has had recurrent hypoglycemia while here. We will decrease meal coverage for now. Decrease Lantus to 10 units. Consult endocrinology. Await neurology consult. Need records from [facility]. He reportedly had an MRI there but has never had an EEG. He remains Dilantin toxic.   LOS: 1 day   Kevin Cooper 10/31/2011, 3:42 PM

## 2011-11-01 ENCOUNTER — Inpatient Hospital Stay (HOSPITAL_COMMUNITY)
Admit: 2011-11-01 | Discharge: 2011-11-01 | Disposition: A | Discharge status: Home / Self Care | Payer: Self-pay | Attending: Neurology | Admitting: Neurology

## 2011-11-01 DIAGNOSIS — G40309 Generalized idiopathic epilepsy and epileptic syndromes, not intractable, without status epilepticus: Secondary | ICD-10-CM

## 2011-11-01 DIAGNOSIS — T420X1A Poisoning by hydantoin derivatives, accidental (unintentional), initial encounter: Secondary | ICD-10-CM

## 2011-11-01 DIAGNOSIS — E109 Type 1 diabetes mellitus without complications: Secondary | ICD-10-CM

## 2011-11-01 DIAGNOSIS — E039 Hypothyroidism, unspecified: Secondary | ICD-10-CM

## 2011-11-01 LAB — COMPREHENSIVE METABOLIC PANEL
ALT: 10 U/L (ref 0–53)
Albumin: 3.5 g/dL (ref 3.5–5.2)
Alkaline Phosphatase: 164 U/L — ABNORMAL HIGH (ref 39–117)
Chloride: 98 mEq/L (ref 96–112)
GFR calc Af Amer: >90 mL/min (ref >90)
Glucose, Bld: 431 mg/dL — ABNORMAL HIGH (ref 70–99)
Potassium: 4.4 mEq/L (ref 3.5–5.1)
Sodium: 134 mEq/L — ABNORMAL LOW (ref 135–145)
Total Bilirubin: 0.2 mg/dL — ABNORMAL LOW (ref 0.3–1.2)
Total Protein: 6.8 g/dL (ref 6.0–8.3)

## 2011-11-01 LAB — PHENYTOIN LEVEL, TOTAL: Phenytoin Lvl: 23.2 ug/mL — ABNORMAL HIGH (ref 10.0–20.0)

## 2011-11-01 MED ORDER — LORATADINE 10 MG PO TABS
10.0000 mg | ORAL_TABLET | Freq: Every day | ORAL | Status: DC
  Administered 2011-11-01 – 2011-11-02 (×2): 10 mg via ORAL
  Filled 2011-11-01 (×2): qty 1

## 2011-11-01 MED ORDER — INSULIN GLARGINE 100 UNIT/ML ~~LOC~~ SOLN
10.0000 [IU] | Freq: Every day | SUBCUTANEOUS | Status: DC
  Administered 2011-11-01: 10 [IU] via SUBCUTANEOUS

## 2011-11-01 NOTE — Progress Notes (Signed)
Subjective: The patient denies feeling confused. He does not complain of feeling lightheaded at rest, but when he stands, occasionally he does feel lightheaded.  Objective: Vital signs in last 24 hours: Filed Vitals:   11/01/11 0232 11/01/11 0609 11/01/11 0628 11/01/11 1020  BP: 120/77  105/66 110/70  Pulse: 83  84 90  Temp: 97.7 F (36.5 C)  98.5 F (36.9 C) 98.8 F (37.1 C)  TempSrc: Oral  Oral Oral  Resp: 18  20 20   Height:      Weight:  58.423 kg (128 lb 12.8 oz) 58.3 kg (128 lb 8.5 oz)   SpO2: 99%  99% 97%    Intake/Output Summary (Last 24 hours) at 11/01/11 1130 Last data filed at 11/01/11 0815  Gross per 24 hour  Intake 2322.5 ml  Output      0 ml  Net 2322.5 ml    Weight change: -2.812 kg (-6 lb 3.2 oz)  Physical exam: Lungs: Clear to auscultation bilaterally. Heart: S1, S2, with no murmurs rubs gallops. Abdomen: Positive bowel sounds, soft, nontender, nondistended. Extremities: No pedal edema. Mild erythema over the toes and metatarsal regions on both feet. Neurologic: He is alert and oriented x3. Cranial nerves II through XII are intact. No resting or intention tremor. No dysarthria. Speech is clear.   Lab Results: Basic Metabolic Panel:  Basename 10/31/11 0504 10/30/11 1756  NA 140 135  K 3.6 3.9  CL 103 95*  CO2 30 30  GLUCOSE 107* 249*  BUN 6 11  CREATININE 0.66 0.76  CALCIUM 8.5 9.0  MG -- --  PHOS -- --   Liver Function Tests:  [facility] 10/30/11 2032  AST 22  ALT 12  ALKPHOS 164*  BILITOT 0.2*  PROT 7.3  ALBUMIN 4.2   No results found for this basename: LIPASE:2,AMYLASE:2 in the last 72 hours No results found for this basename: AMMONIA:2 in the last 72 hours CBC:  Basename 10/31/11 0504 10/30/11 1756  WBC 9.4 9.3  NEUTROABS -- --  HGB 13.1 13.6  HCT 38.5* 39.6  MCV 88.5 88.4  PLT 159 148*   Cardiac Enzymes: No results found for this basename: CKTOTAL:3,CKMB:3,CKMBINDEX:3,TROPONINI:3 in the last 72 hours BNP: No results found  for this basename: PROBNP:3 in the last 72 hours D-Dimer: No results found for this basename: DDIMER:2 in the last 72 hours CBG:  Basename 11/01/11 0748 10/31/11 2146 10/31/11 1613 10/31/11 1225 10/31/11 1157 10/31/11 1136  GLUCAP 68* 174* 282* 126* 51* 39*   Hemoglobin A1C:  Basename 10/30/11 1756  HGBA1C 7.6*   Fasting Lipid Panel: No results found for this basename: CHOL,HDL,LDLCALC,TRIG,CHOLHDL,LDLDIRECT in the last 72 hours Thyroid Function Tests:  Basename 10/30/11 1756  TSH 32.191*  T4TOTAL --  FREET4 --  T3FREE --  THYROIDAB --   Anemia Panel: No results found for this basename: VITAMINB12,FOLATE,FERRITIN,TIBC,IRON,RETICCTPCT in the last 72 hours Coagulation: No results found for this basename: LABPROT:2,INR:2 in the last 72 hours Urine Drug Screen: Drugs of Abuse     Component Value Date/Time   LABOPIA NONE DETECTED 10/30/2011 2328   COCAINSCRNUR NONE DETECTED 10/30/2011 2328   LABBENZ NONE DETECTED 10/30/2011 2328   AMPHETMU NONE DETECTED 10/30/2011 2328   THCU NONE DETECTED 10/30/2011 2328   LABBARB NONE DETECTED 10/30/2011 2328    Alcohol Level: No results found for this basename: ETH:2 in the last 72 hours Urinalysis:  Basename 10/30/11 2328  COLORURINE YELLOW  LABSPEC 1.010  PHURINE 5.5  GLUCOSEU >1000*  HGBUR NEGATIVE  BILIRUBINUR NEGATIVE  KETONESUR TRACE*  PROTEINUR NEGATIVE  UROBILINOGEN 0.2  NITRITE NEGATIVE  LEUKOCYTESUR NEGATIVE   Misc. Labs:   Micro: No results found for this or any previous visit (from the past 240 hour(s)).  Studies/Results: Ct Head Wo Contrast  10/30/2011  *RADIOLOGY REPORT*  Clinical Data: Seizure.  Headache.  CT HEAD WITHOUT CONTRAST  Technique:  Contiguous axial images were obtained from the base of the skull through the vertex without contrast.  Comparison: CT head 09/03/2011.  MRI of the brain 09/04/2011.  Findings: No acute intracranial abnormality.  Specifically, no evidence of acute hemorrhage, no signs of  acute/subacute cerebral ischemia, no focal mass, mass effect, hydrocephalus or abnormal intra or extra-axial fluid collections.  No acute displaced skull fractures are identified.  Visualized paranasal sinuses and mastoids are well pneumatized.  IMPRESSION: 1.  No acute intracranial abnormalities. 2.  The appearance the brain is normal.  Original Report Authenticated By: Florencia Reasons, M.D.    Medications:  Scheduled:   . clotrimazole   Topical BID  . dextrose      . enoxaparin  40 mg Subcutaneous Q24H  . insulin aspart  0-9 Units Subcutaneous TID WC  . insulin aspart  5 Units Subcutaneous TID WC  . insulin glargine  15 Units Subcutaneous QHS  . levothyroxine  50 mcg Oral Daily  . sodium chloride      . DISCONTD: insulin aspart  0-5 Units Subcutaneous QHS  . DISCONTD: insulin aspart  4 Units Subcutaneous TID WC  . DISCONTD: insulin glargine  10 Units Subcutaneous QHS  . DISCONTD: insulin glargine  25 Units Subcutaneous QHS   Continuous:   . 0.9 % NaCl with KCl 20 mEq / L 75 mL/hr at 10/31/11 2316   ZOX:[id number], bisacodyl, dextrose, dextrose, ondansetron (ZOFRAN) IV, traZODone  Assessment: Principal Problem:  *Dilantin toxicity Active Problems:  Convulsions/seizures  Hypothyroidism  Tinea pedis  DM (diabetes mellitus), type 1  Hypoglycemia   1. Dilantin toxicity. His Dilantin level has decreased, but is still in the toxic range. He is clearly less symptomatic.  Seizure disorder. Dr. Ronal Fear assessment and recommendations are noted. Treatment may be limited by the patient's inability to afford of her anti-epileptic medications.  Type 1 diabetes mellitus with hypoglycemia. The patient's diabetes appeared to be brittle. His hemoglobin A1c is 7.6. Dr. Isidoro Donning  assessment is noted and appreciated.  Hypothyroidism. The patient's TSH is greater than 32. He has been on 50 mcg of levothyroxine for several months without his thyroid function being reassessed, according to  him. A free T4 and free T3 we'll need to be ordered today for further evaluation.  Financial constraints. The patient is uninsured and unemployed. Given his seizure disorder, type 1 diabetes mellitus, and hypothyroidism, hopefully he would be eligible for Medicaid. He is awaiting medication applications.  Plan:  1. We'll order a free T4 and free T3. Will adjust levothyroxine dosing accordingly. 2. We'll continue to monitor his Dilantin level daily. 3. We'll await the followup recommendations by Dr. [name] and Dr. Fransico Him. 4. Possible discharge tomorrow if his phenytoin level is less than supratherapeutic. 5. Case management assistance and/or financial counseling assistance before discharge.   LOS: 2 days   Sanaiya Welliver 11/01/2011, 11:30 AM

## 2011-11-01 NOTE — Plan of Care (Signed)
Problem: Phase I Progression Outcomes Goal: Pain controlled with appropriate interventions Outcome: Not Applicable Date Met:  11/01/11 No c/o pain

## 2011-11-01 NOTE — Progress Notes (Signed)
Kevin Cooper, Kevin Cooper                 ACCOUNT NO.:  [id number]  MEDICAL RECORD NO.:  [id number]  LOCATION:  A340                          FACILITY:  [facility]  PHYSICIAN:  Purcell Nails, MD DATE OF BIRTH:  06-Jan-1981  DATE OF PROCEDURE:  11/01/2011 DATE OF DISCHARGE:                                PROGRESS NOTE   REASON FOR FOLLOWUP:  Uncontrolled type 1 diabetes.  SUBJECTIVE:  The patient feels better today, however, he had episodes of hyperglycemia alternating with hypoglycemia.  This morning, he had CBG of 68, because of which his breakfast NovoLog was skipped and at lunch he ran hyperglycemia of 364, overnight events were noted.  The patient still has good appetite.  OBJECTIVE:  GENERAL:  Stable, not in acute distress. VITAL SIGNS:  Blood pressure is 110/70, pulse rate 90, temperature 98.8. HEENT:  Well hydrated. NECK:  Negative for JVD or thyromegaly. CHEST:  Clear to auscultation bilaterally. CARDIOVASCULAR:  Normal S1, S2.  No murmur.  No gallop. ABDOMEN:  Soft and nontender. EXTREMITIES:  No edema. SKIN:  Extensive tattoos.  LABS:  Today, sodium 134, potassium 4.4, chloride 98, bicarb 28, BUN 8, creatinine 0.73.  His A1c was 7.6 yesterday.  His CBG basically ranges from 68-364 premeals today.  ASSESSMENT: 1. Type 1 diabetes, chronically uncontrolled. 2. Seizure disorders. 3. Hypothyroidism. 4. Poor medical insurance coverage.  PLAN:  As a type 1 diabetic, he will need at least basal insulin, which at this time will be lowered to 10 units of Lantus at bedtime and he will also need mealtime coverage with NovoLog.  If he is eating his full meals, I will continue 5 units of NovoLog plus sensitive dose sliding scale, and we will continue to follow up inhouse to titrate his insulin according to blood glucose readings.  He is encouraged to complete his application for some kind of medical insurance because this patient has chronic potentially fatal disease of type 1  diabetes and now diagnosis of seizure disorders.          ______________________________ Purcell Nails, MD     GN/MEDQ  D:  11/01/2011  T:  11/01/2011  Job:  [id number]

## 2011-11-01 NOTE — Progress Notes (Signed)
Patient ID: Kevin Cooper, male   DOB: 1980-06-22, 31 y.o.   MRN: [id number]

## 2011-11-01 NOTE — Care Management Note (Unsigned)
    Page 1 of 1   11/02/2011     4:48:07 PM   CARE MANAGEMENT NOTE 11/02/2011  Patient:  Kevin Cooper, Kevin Cooper   Account Number:  [id number]  Date Initiated:  10/31/2011  Documentation initiated by:  Rosemary Holms  Subjective/Objective Assessment:   Pt admitted for seizure activity. Lives at home with spouse. Pt has an established relationship with [employer] Department and is on a flat fee program with [facility] [employer].     Action/Plan:   Spoke with pt at bedside. CM needs not anticipated   Anticipated DC Date:  11/02/2011   Anticipated DC Plan:  HOME/SELF CARE  In-house referral  Financial Counselor      DC Planning Services  CM consult  Medication Assistance      Choice offered to / List presented to:             Status of service:  Completed, signed off Medicare Important Message given?   (If response is "NO", the following Medicare IM given date fields will be blank) Date Medicare IM given:   Date Additional Medicare IM given:    Discharge Disposition:  HOME/SELF CARE  Per UR Regulation:    If discussed at Long Length of Stay Meetings, dates discussed:    Comments:  11/02/11 1400 Kevin Castagnola RN BSN CM Assisted with antibiotics from the [facility] medication indegent fund.  11/01/11 1200 Kevin David Leanord Hawking RN BSN  CM Followed up with Laurice Record, financial counselor. She has now brought up the paper work for pt to complete for Medicaid and financial assistance.  10/31/11 1130 Kevin Pandya Leanord Hawking RN BSN CM

## 2011-11-02 DIAGNOSIS — E039 Hypothyroidism, unspecified: Secondary | ICD-10-CM

## 2011-11-02 DIAGNOSIS — T420X1A Poisoning by hydantoin derivatives, accidental (unintentional), initial encounter: Secondary | ICD-10-CM

## 2011-11-02 DIAGNOSIS — G40309 Generalized idiopathic epilepsy and epileptic syndromes, not intractable, without status epilepticus: Secondary | ICD-10-CM

## 2011-11-02 DIAGNOSIS — E109 Type 1 diabetes mellitus without complications: Secondary | ICD-10-CM

## 2011-11-02 LAB — CBC
HCT: 35.2 % — ABNORMAL LOW (ref 39.0–52.0)
MCH: 29.7 pg (ref 26.0–34.0)
MCV: 87.8 fL (ref 78.0–100.0)
RBC: 4.01 MIL/uL — ABNORMAL LOW (ref 4.22–5.81)
WBC: 6.9 10*3/uL (ref 4.0–10.5)

## 2011-11-02 LAB — GLUCOSE, CAPILLARY
Glucose-Capillary: 200 mg/dL — ABNORMAL HIGH (ref 70–99)
Glucose-Capillary: 34 mg/dL — CL (ref 70–99)
Glucose-Capillary: 124 mg/dL — ABNORMAL HIGH (ref 70–99)
Glucose-Capillary: 358 mg/dL — ABNORMAL HIGH (ref 70–99)

## 2011-11-02 LAB — T3, FREE: T3, Free: 1.4 pg/mL — ABNORMAL LOW (ref 2.3–4.2)

## 2011-11-02 MED ORDER — CLOTRIMAZOLE 1 % EX CREA: TOPICAL_CREAM | Freq: Two times a day (BID) | CUTANEOUS | Status: AC

## 2011-11-02 MED ORDER — INSULIN GLARGINE 100 UNIT/ML ~~LOC~~ SOLN: 15.0000 [IU] | Freq: Every day | SUBCUTANEOUS | Status: DC

## 2011-11-02 MED ORDER — LEVETIRACETAM 500 MG PO TABS
500.0000 mg | ORAL_TABLET | Freq: Two times a day (BID) | ORAL | Status: DC
  Administered 2011-11-02: 500 mg via ORAL
  Filled 2011-11-02: qty 1

## 2011-11-02 MED ORDER — INSULIN LISPRO 100 UNIT/ML ~~LOC~~ SOLN: SUBCUTANEOUS | Status: DC

## 2011-11-02 MED ORDER — LEVOTHYROXINE SODIUM 75 MCG PO TABS
75.0000 ug | ORAL_TABLET | Freq: Every day | ORAL | Status: DC
  Administered 2011-11-02: 75 ug via ORAL
  Filled 2011-11-02: qty 1

## 2011-11-02 MED ORDER — LEVETIRACETAM 500 MG PO TABS: 500.0000 mg | ORAL_TABLET | Freq: Two times a day (BID) | ORAL | Status: DC

## 2011-11-02 MED ORDER — LEVOTHYROXINE SODIUM 75 MCG PO TABS: 75.0000 ug | ORAL_TABLET | Freq: Every day | ORAL | Status: DC

## 2011-11-02 NOTE — Progress Notes (Signed)
Patient ID: Kevin Cooper, male   DOB: 07-Jan-1981, 31 y.o.   MRN: [id number]

## 2011-11-02 NOTE — Progress Notes (Signed)
IV removed, site WNL.  Pt given d/c instructions and new prescriptions.  Discussed home care with patient and discussed home medications, patient verbalizes understanding, teachback completed. F/U appointments to be made by pt, pt states they will keep appointment. Pt is stable at this time. BG rechecked prior to d/c MD aware of results. Pt taken to main entrance in wheelchair by staff member.

## 2011-11-02 NOTE — Procedures (Signed)
NAMEBLADIMIR, AUMAN                 ACCOUNT NO.:  [id number]  MEDICAL RECORD NO.:  [id number]  LOCATION:  A340                          FACILITY:  [facility]  PHYSICIAN:  Zaahir Pickney A. [name], M.D. DATE OF BIRTH:  [birth year]  DATE OF PROCEDURE:  11/01/2011 DATE OF DISCHARGE:                             EEG INTERPRETATION   INDICATION:  The patient is a 31 year old who presents with recurrent seizures.  MEDICATION:  Dilantin, insulin, Synthroid.  ANALYSIS:  A 16-channel recording is conducted for 22 minutes.  There is a well-formed posterior dominant rhythm of 10 Hz, which attenuates with eye opening.  The background activity is also interspaced with beta activity of 15 Hz.  There is higher beta activity noted in the frontal areas.  Awake and sleep activities are recorded.  Stage II sleep with K complexes and sleep spindles are observed.  Again, photic stimulation and hypoventilation are carried out without abnormal changes in the background activity.  There is no focal or lateralized slowing.  There is no epileptiform activity observed.  IMPRESSION:  Normal recording of awake and sleep states.  The single recording does not rule out epileptic seizures.  If clinically indicated, a sleep-deprived or prolonged recording may be useful.     Micheale Schlack A. [name], M.D.     KAD/MEDQ  D:  11/02/2011  T:  11/02/2011  Job:  [id number]

## 2011-11-02 NOTE — Progress Notes (Signed)
NAMECLEATIS, FANDRICH                 ACCOUNT NO.:  [id number]  MEDICAL RECORD NO.:  [id number]  LOCATION:  A340                          FACILITY:  [facility]  PHYSICIAN:  Deleon Passe A. [name], M.D. DATE OF BIRTH:  02/04/1981  DATE OF PROCEDURE:  11/02/2011 DATE OF DISCHARGE:                                PROGRESS NOTE   No new seizures are reported.  Dilantin level is down to 18.  The patient is awake, alert.  He is lucid and coherent.  EEG was obtained and the results are actually normal.  Given the MRI findings potential for calcification in the left hippocampal region, the patient will therefore be started on seizure medications for partial seizures. Keppra seems a good option.  We will start 500 mg b.i.d.  We will have the patient follow up in the office in 4 weeks.  He is to discontinue the Dilantin.  Arrangements are in the process of being made to see if his supplemental insurance will cover the Keppra and we will have a nurse check this out.     Caylei Sperry A. [name], M.D.     KAD/MEDQ  D:  11/02/2011  T:  11/02/2011  Job:  [id number]

## 2011-11-02 NOTE — Progress Notes (Signed)
Kevin Cooper, Kevin Cooper                 ACCOUNT NO.:  [id number]  MEDICAL RECORD NO.:  [id number]  LOCATION:  A340                          FACILITY:  [facility]  PHYSICIAN:  Purcell Nails, MD DATE OF BIRTH:  Oct 08, 1980  DATE OF PROCEDURE:  11/02/2011 DATE OF DISCHARGE:                                PROGRESS NOTE   REASON FOR CONSULT:  Followup uncontrolled type 1 diabetes.  SUBJECTIVE:  The patient feels better, no hypoglycemia with blood glucose readings are stable within 150-280 range.  He is tolerating his oral intake of food.  Overnight issues were reviewed.  The patient still has good appetite.  OBJECTIVE:  GENERAL:  He is stable not in acute distress. VITAL SIGNS:  Blood pressure is 104/68, pulse rate 91, temperature 98.2. HEENT:  Well hydrated. CHEST:  Clear to auscultation bilaterally. CARDIOVASCULAR:  Normal S1, S2.  No murmur.  No gallop. ABDOMEN:  Soft and nontender. EXTREMITIES:  No edema. CNS:  Nonfocal. SKIN:  Really extensive tattoos.  LABORATORY DATA:  His labs are stable.  Blood glucose ranging from 150 to 280s.  ASSESSMENT: 1. Type 1 diabetes, uncontrolled, A1c recently was 7.6%. 2. Seizure disorder. 3. Hypothyroidism. 4. Problem of medical insurance.  PLAN:  The patient will continue on basal bolus insulin, currently glycemia stability achieved, however, not at target.  I will increase the Lantus to 15 units at bedtime, and continue NovoLog at 5 units t.i.d. before meals plus sensitive dose sliding scale.  I offered the patient outpatient followup and getting my contact for problems of insulin supply.  He may obtain samples of insulin from my office while he is working on possible Proofreader.          ______________________________ Purcell Nails, MD     GN/MEDQ  D:  11/02/2011  T:  11/02/2011  Job:  [id number]

## 2011-11-02 NOTE — Progress Notes (Signed)
Called [phone], they do cover the generic brand of keppra.

## 2011-11-02 NOTE — Discharge Summary (Signed)
Physician Discharge Summary  Kevin Cooper MRN: [id number] DOB/AGE: [birth year] 32 y.o.  PCP: No primary provider on file.   Admit date: 10/30/2011 Discharge date: 11/02/2011  Discharge Diagnoses:  1. Dilantin toxicity. 2. Seizure disorder. Diagnosed in April of 2013. EEG per Dr. [name] on 11/01/2011 revealed a normal study. 3. MRI of the brain in April 2013 at [facility] revealed a subtle alteration in the appearance of the left hippocampal complex. 4. Hypothyroidism. Dose of Synthroid increased with a elevated TSH of 32.2 and a low free T4 of 0.45. 5. Brittle type 1 diabetes mellitus, with an episode of hypoglycemia. His hemoglobin A1c was 7.6. 6. Mild normocytic anemia. 7. Tinea pedis. 8. Uninsured and unemployed.    Medication List  As of 11/02/2011  3:52 PM   STOP taking these medications         insulin aspart protamine-insulin aspart (70-30) 100 UNIT/ML injection      phenytoin 100 MG ER capsule         TAKE these medications         clotrimazole 1 % cream   Commonly known as: LOTRIMIN   Apply topically 2 (two) times daily.      insulin glargine 100 UNIT/ML injection   Commonly known as: LANTUS   Inject 15 Units into the skin at bedtime.      insulin lispro 100 UNIT/ML injection   Commonly known as: HUMALOG   TAKE 5 UNITS WITH BREAKFAST, LUNCH, AND DINNER.   ALSO,  USE SLIDING SCALE AS FOLLOWS FOR BREAKFAST, LUNCH, DINNER, AND BEDTIME: FOR BLOOD SUGAR 121-150 TAKE 1 UNIT OF INSULIN. FOR BLOOD SUGAR 151-200 TAKE 2 UNITS. FOR BLOOD SUGAR 201-250 TAKE 3 UNITS. FOR BLOOD SUGAR 251-300 TAKE 5 UNITS. FOR BLOOD SUGAR 301-350 TAKE 7 UNITS. FOR BLOOD SUGAR 351-400 TAKE 9 UNITS. FOR BLOOD SUGAR GREATER THAN 400 TAKE 12 UNITS AND CALL YOUR DOCTOR.      levETIRAcetam 500 MG tablet   Commonly known as: KEPPRA   Take 1 tablet (500 mg total) by mouth 2 (two) times daily.      levothyroxine 75 MCG tablet   Commonly known as: SYNTHROID, LEVOTHROID   Take 1 tablet (75 mcg  total) by mouth daily.            Discharge Condition: Improved.  Disposition: Home.   Consults: Neurologist, Dr. [name]. Endocrinologist, Dr. Fransico Him.   Significant Diagnostic Studies: Ct Head Wo Contrast  10/30/2011  *RADIOLOGY REPORT*  Clinical Data: Seizure.  Headache.  CT HEAD WITHOUT CONTRAST  Technique:  Contiguous axial images were obtained from the base of the skull through the vertex without contrast.  Comparison: CT head 09/03/2011.  MRI of the brain 09/04/2011.  Findings: No acute intracranial abnormality.  Specifically, no evidence of acute hemorrhage, no signs of acute/subacute cerebral ischemia, no focal mass, mass effect, hydrocephalus or abnormal intra or extra-axial fluid collections.  No acute displaced skull fractures are identified.  Visualized paranasal sinuses and mastoids are well pneumatized.  IMPRESSION: 1.  No acute intracranial abnormalities. 2.  The appearance the brain is normal.  Original Report Authenticated By: Florencia Reasons, M.D.     Microbiology: No results found for this or any previous visit (from the past 240 hour(s)).   Labs: Results for orders placed during the hospital encounter of 10/30/11 (from the past 48 hour(s))  GLUCOSE, CAPILLARY     Status: Abnormal   Collection Time   10/31/11  4:13 PM  Component Value Range Comment   Glucose-Capillary 282 (*) 70 - 99 (mg/dL)   GLUCOSE, CAPILLARY     Status: Abnormal   Collection Time   10/31/11  9:46 PM      Component Value Range Comment   Glucose-Capillary 174 (*) 70 - 99 (mg/dL)   PHENYTOIN LEVEL, TOTAL     Status: Abnormal   Collection Time   11/01/11  5:51 AM      Component Value Range Comment   Phenytoin Lvl 23.2 (*) 10.0 - 20.0 (ug/mL)   GLUCOSE, CAPILLARY     Status: Abnormal   Collection Time   11/01/11  7:48 AM      Component Value Range Comment   Glucose-Capillary 68 (*) 70 - 99 (mg/dL)   GLUCOSE, CAPILLARY     Status: Abnormal   Collection Time   11/01/11 11:27 AM       Component Value Range Comment   Glucose-Capillary 364 (*) 70 - 99 (mg/dL)    Comment 1 Documented in Chart     T4, FREE     Status: Abnormal   Collection Time   11/01/11 12:23 PM      Component Value Range Comment   Free T4 0.45 (*) 0.80 - 1.80 (ng/dL)   T3, FREE     Status: Abnormal   Collection Time   11/01/11 12:23 PM      Component Value Range Comment   T3, Free 1.4 (*) 2.3 - 4.2 (pg/mL)   COMPREHENSIVE METABOLIC PANEL     Status: Abnormal   Collection Time   11/01/11 12:23 PM      Component Value Range Comment   Sodium 134 (*) 135 - 145 (mEq/L)    Potassium 4.4  3.5 - 5.1 (mEq/L) DELTA CHECK NOTED   Chloride 98  96 - 112 (mEq/L)    CO2 28  19 - 32 (mEq/L)    Glucose, Bld 431 (*) 70 - 99 (mg/dL)    BUN 8  6 - 23 (mg/dL)    Creatinine, Ser 1.61  0.50 - 1.35 (mg/dL)    Calcium 8.8  8.4 - 10.5 (mg/dL)    Total Protein 6.8  6.0 - 8.3 (g/dL)    Albumin 3.5  3.5 - 5.2 (g/dL)    AST 14  0 - 37 (U/L)    ALT 10  0 - 53 (U/L)    Alkaline Phosphatase 164 (*) 39 - 117 (U/L)    Total Bilirubin 0.2 (*) 0.3 - 1.2 (mg/dL)    GFR calc non Af Amer >90  >90 (mL/min)    GFR calc Af Amer >90  >90 (mL/min)   GLUCOSE, CAPILLARY     Status: Abnormal   Collection Time   11/01/11  4:38 PM      Component Value Range Comment   Glucose-Capillary 152 (*) 70 - 99 (mg/dL)    Comment 1 Documented in Chart     GLUCOSE, CAPILLARY     Status: Abnormal   Collection Time   11/01/11  9:51 PM      Component Value Range Comment   Glucose-Capillary 149 (*) 70 - 99 (mg/dL)   PHENYTOIN LEVEL, TOTAL     Status: Normal   Collection Time   11/02/11  5:43 AM      Component Value Range Comment   Phenytoin Lvl 18.2  10.0 - 20.0 (ug/mL)   CBC     Status: Abnormal   Collection Time   11/02/11  5:43 AM  Component Value Range Comment   WBC 6.9  4.0 - 10.5 (K/uL)    RBC 4.01 (*) 4.22 - 5.81 (MIL/uL)    Hemoglobin 11.9 (*) 13.0 - 17.0 (g/dL)    HCT 16.1 (*) 09.6 - 52.0 (%)    MCV 87.8  78.0 - 100.0 (fL)    MCH 29.7   26.0 - 34.0 (pg)    MCHC 33.8  30.0 - 36.0 (g/dL)    RDW 04.5  40.9 - 81.1 (%)    Platelets 157  150 - 400 (K/uL)   GLUCOSE, CAPILLARY     Status: Abnormal   Collection Time   11/02/11  8:08 AM      Component Value Range Comment   Glucose-Capillary 285 (*) 70 - 99 (mg/dL)    Comment 1 Notify RN     GLUCOSE, CAPILLARY     Status: Abnormal   Collection Time   11/02/11 12:14 PM      Component Value Range Comment   Glucose-Capillary 200 (*) 70 - 99 (mg/dL)      HPI : The patient is a 31 year old man with a past medical history significant for type 1 diabetes mellitus and hypothyroidism. He was recently diagnosed with a seizure disorder in April of 2013 at [facility]. Following his discharge from [facility], he was prescribed Dilantin. He followed up with the health department in [facility]. They referred him to a neurologist at [facility]. Adjustments were made in his medication. He was unable to go back due to lack of finances. He presented to the emergency department on 10/30/2011 with a report of confusion by his family members. In the emergency department, he was afebrile and hemodynamically stable. CT scan of his head revealed no acute intracranial findings. His glucose was 199. His phenytoin level was in the toxic range at 39.6. He was admitted for further evaluation and management.  HOSPITAL COURSE: The patient was started on IV fluids for gentle hydration. Dilantin was discontinued. Lotrimin cream was ordered for the treatment of tinea pedis. 70/30 insulin and Lantus were continued. Sliding scale NovoLog was eventually added. His urine drug screen was negative. For further evaluation, a number of studies were ordered. His hemoglobin A1c was 7.6. His TSH was elevated at 32, therefore free T3 and free T4 were ordered. His free T4 was low at 0.45 and his free T3 was low at 1.4. Therefore, the dose of Synthroid was increased from 50-75 mcg daily. His  capillary blood glucose was somewhat brittle. He did have one relatively asymptomatic episode of hypoglycemia when his blood glucose fell to 51. He was treated appropriately. Endocrinologist, Dr. Fransico Him was consulted. Following his assessment, he discontinued 70/30 insulin and continued Lantus with sliding-scale NovoLog. Subsequently, he added 5 units of NovoLog with each meal in addition to the sliding scale NovoLog. His blood glucose improved overall, but it remained somewhat brittle.  Neurologist, Dr. [name] was consulted. He ordered records from [facility]. The MRI of the patient's brain at [facility] revealed a subtle alteration in the appearance of the left hippocampal complex. An EEG was not done there. Therefore, Dr. [name] ordered an EEG.. The results revealed no epileptiform activity at the time of the study. When the patient's Dilantin/phenytoin level normalized or returned to therapeutic levels, Keppra was started per the recommendation of Dr. [name]. The patient was advised to discontinue Dilantin altogether.  The patient receives his healthcare at the [facility] Department. He also receives a discount  on his medications through a discount pharmacy. However, not all of his medications are covered. He is uninsured and unemployed. Both of the specialists consulted will try to see the patient if needed in the outpatient setting. The patient was given a prescription voucher for Keppra prior to discharge. He will be able to obtain his other medications from either the [facility] pharmacy or from the health department or from the discount pharmacy he has been using.  He was discharged in improved and stable condition.   Discharge Exam:  Blood pressure 95/63, pulse 83, temperature 98.1 F (36.7 C), temperature source Oral, resp. rate 20, height 5\' 5"  (1.651 m), weight 58 kg (127 lb 13.9 oz), SpO2 98.00%.  Lungs: Clear to auscultation bilaterally. Heart: S1, S2, with  no murmurs rubs or gallops. Abdomen: Positive bowel sounds, soft, nontender, nondistended. Extremities: No pedal edema. Neurologic: Alert and oriented x3. Cranial nerves II through XII are intact.   Discharge Orders    Future Orders Please Complete By Expires   Diet Carb Modified      Increase activity slowly      Discharge instructions      Comments:   Your insulin dosing has changed; see your instructions on the prescription and other discharge information. The dose of your thyroid medication was increased to 75 mcg daily. Asked your primary care provider to recheck your TSH and free T4 levels in 2-3 months. Do not take Dilantin. Your seizure medicine has been changed to Keppra.      Follow-up Information    Follow up with DOONQUAH,KOFI, MD. Schedule an appointment as soon as possible for a visit in 1 month.   Contact information:   [address] [place] [address] [phone]       Please follow up. (FOLLOW UP AT THE HEALTH DEPARTMENT IN 3-7 DAYS.)          Total discharge time: 35 minutes.   Signed: Ricca Melgarejo 11/02/2011, 3:52 PM

## 2011-11-02 NOTE — Progress Notes (Signed)
CBG:27  Treatment: 15 GM gel  Symptoms: None  Follow-up CBG: Time:1607 CBG Result:  Possible Reasons for Event: unknown  Comments/MD notified:    Luther Redo

## 2012-11-09 DIAGNOSIS — R030 Elevated blood-pressure reading, without diagnosis of hypertension: Secondary | ICD-10-CM

## 2013-03-11 ENCOUNTER — Emergency Department (HOSPITAL_COMMUNITY)
Admission: EM | Admit: 2013-03-11 | Discharge: 2013-03-11 | Disposition: A | Discharge status: Home / Self Care | Payer: Self-pay | Attending: Emergency Medicine | Admitting: Emergency Medicine

## 2013-03-11 ENCOUNTER — Emergency Department (HOSPITAL_COMMUNITY): Payer: Self-pay

## 2013-03-11 ENCOUNTER — Encounter (HOSPITAL_COMMUNITY): Payer: Self-pay | Admitting: Emergency Medicine

## 2013-03-11 DIAGNOSIS — R296 Repeated falls: Secondary | ICD-10-CM | POA: Insufficient documentation

## 2013-03-11 DIAGNOSIS — S139XXA Sprain of joints and ligaments of unspecified parts of neck, initial encounter: Secondary | ICD-10-CM | POA: Insufficient documentation

## 2013-03-11 DIAGNOSIS — G40909 Epilepsy, unspecified, not intractable, without status epilepticus: Secondary | ICD-10-CM | POA: Insufficient documentation

## 2013-03-11 DIAGNOSIS — S161XXA Strain of muscle, fascia and tendon at neck level, initial encounter: Secondary | ICD-10-CM

## 2013-03-11 DIAGNOSIS — Z880 Allergy status to penicillin: Secondary | ICD-10-CM | POA: Insufficient documentation

## 2013-03-11 DIAGNOSIS — R569 Unspecified convulsions: Secondary | ICD-10-CM

## 2013-03-11 DIAGNOSIS — Z79899 Other long term (current) drug therapy: Secondary | ICD-10-CM | POA: Insufficient documentation

## 2013-03-11 DIAGNOSIS — Y9210 Unspecified residential institution as the place of occurrence of the external cause: Secondary | ICD-10-CM | POA: Insufficient documentation

## 2013-03-11 DIAGNOSIS — S060X9A Concussion with loss of consciousness of unspecified duration, initial encounter: Secondary | ICD-10-CM | POA: Insufficient documentation

## 2013-03-11 DIAGNOSIS — E162 Hypoglycemia, unspecified: Secondary | ICD-10-CM

## 2013-03-11 DIAGNOSIS — E079 Disorder of thyroid, unspecified: Secondary | ICD-10-CM | POA: Insufficient documentation

## 2013-03-11 DIAGNOSIS — S0990XA Unspecified injury of head, initial encounter: Secondary | ICD-10-CM

## 2013-03-11 DIAGNOSIS — Z87891 Personal history of nicotine dependence: Secondary | ICD-10-CM | POA: Insufficient documentation

## 2013-03-11 DIAGNOSIS — Z794 Long term (current) use of insulin: Secondary | ICD-10-CM | POA: Insufficient documentation

## 2013-03-11 DIAGNOSIS — Y939 Activity, unspecified: Secondary | ICD-10-CM | POA: Insufficient documentation

## 2013-03-11 DIAGNOSIS — E1169 Type 2 diabetes mellitus with other specified complication: Secondary | ICD-10-CM | POA: Insufficient documentation

## 2013-03-11 LAB — CBC WITH DIFFERENTIAL/PLATELET
Eosinophils Relative: 1 % (ref 0–5)
Hemoglobin: 12.4 g/dL — ABNORMAL LOW (ref 13.0–17.0)
Lymphocytes Relative: 15 % (ref 12–46)
Lymphs Abs: 1.5 10*3/uL (ref 0.7–4.0)
MCH: 30.4 pg (ref 26.0–34.0)
MCV: 84.6 fL (ref 78.0–100.0)
Monocytes Relative: 13 % — ABNORMAL HIGH (ref 3–12)
Platelets: 165 10*3/uL (ref 150–400)
RBC: 4.08 MIL/uL — ABNORMAL LOW (ref 4.22–5.81)
WBC: 9.8 10*3/uL (ref 4.0–10.5)

## 2013-03-11 LAB — COMPREHENSIVE METABOLIC PANEL
ALT: 24 U/L (ref 0–53)
Alkaline Phosphatase: 97 U/L (ref 39–117)
BUN: 12 mg/dL (ref 6–23)
CO2: 27 mEq/L (ref 19–32)
Calcium: 8.5 mg/dL (ref 8.4–10.5)
GFR calc Af Amer: >90 mL/min (ref >90)
GFR calc non Af Amer: >90 mL/min (ref >90)
Glucose, Bld: 69 mg/dL — ABNORMAL LOW (ref 70–99)
Potassium: 3.4 mEq/L — ABNORMAL LOW (ref 3.5–5.1)
Sodium: 128 mEq/L — ABNORMAL LOW (ref 135–145)

## 2013-03-11 LAB — PHENYTOIN LEVEL, TOTAL: Phenytoin Lvl: <2.5 ug/mL — ABNORMAL LOW (ref 10.0–20.0)

## 2013-03-11 MED ORDER — LEVETIRACETAM 500 MG/5ML IV SOLN
INTRAVENOUS | Status: AC
  Filled 2013-03-11: qty 5

## 2013-03-11 MED ORDER — LEVETIRACETAM 500 MG PO TABS: 500.0000 mg | ORAL_TABLET | Freq: Two times a day (BID) | ORAL | Status: DC

## 2013-03-11 MED ORDER — PROMETHAZINE HCL 25 MG/ML IJ SOLN
12.5000 mg | Freq: Once | INTRAMUSCULAR | Status: AC
  Administered 2013-03-11: 12.5 mg via INTRAVENOUS
  Filled 2013-03-11: qty 1

## 2013-03-11 MED ORDER — SODIUM CHLORIDE 0.9 % IV SOLN
500.0000 mg | Freq: Once | INTRAVENOUS | Status: AC
  Administered 2013-03-11: 500 mg via INTRAVENOUS
  Filled 2013-03-11: qty 5

## 2013-03-11 MED ORDER — ONDANSETRON HCL 4 MG/2ML IJ SOLN
4.0000 mg | Freq: Once | INTRAMUSCULAR | Status: AC
  Administered 2013-03-11: 4 mg via INTRAVENOUS

## 2013-03-11 MED ORDER — ONDANSETRON HCL 4 MG/2ML IJ SOLN
4.0000 mg | Freq: Once | INTRAMUSCULAR | Status: DC
  Filled 2013-03-11: qty 2

## 2013-03-11 NOTE — ED Provider Notes (Signed)
CSN: [id number]     Arrival date & time 03/11/13  0020 History   First MD Initiated Contact with Patient 03/11/13 0022     Chief Complaint  Patient presents with  . Hypoglycemia  . Fall   (Consider location/radiation/quality/duration/timing/severity/associated sxs/prior Treatment) HPI Comments: Patient is a 32 year old male with past medical history significant for diabetes and seizure disorder. He was brought by EMS after being found in his jail cell face down and unresponsive. Get a contusion on his forehead was bleeding from the nose. When EMS arrived his blood sugar was originally low. He was given D50 and is now more responsive and alert. He denies to me he is having any pain. The bleeding from his nose has been controlled. He is complaining of nausea and has vomited several times.  On reviewing his record, it appears as though he was hospitalized at [facility] for delayed and toxicity in June of 2013. It appears as though he is no longer taking Dilantin but is supposed to be taking Keppra. He has been in the custody of the authorities for the past 2 weeks and has only been receiving insulin. He is not taking any seizure medication at this time.  Patient is a 32 y.o. male presenting with hypoglycemia and fall. The history is provided by the patient.  Hypoglycemia Initial blood sugar:  Unreadable Severity:  Moderate Onset quality:  Sudden Duration:  1 hour Timing:  Constant Progression:  Resolved Chronicity:  New Diabetic status:  Controlled with insulin Relieved by:  Nothing Ineffective treatments:  None tried Fall    Past Medical History  Diagnosis Date  . Seizures   . Thyroid disease   . Diabetes mellitus    History reviewed. No pertinent past surgical history. No family history on file. History  Substance Use Topics  . Smoking status: Former Games developer  . Smokeless tobacco: Former Neurosurgeon  . Alcohol Use: No    Review of Systems  All other systems reviewed and are  negative.    Allergies  Penicillins and Tomato  Home Medications   Current Outpatient Rx  Name  Route  Sig  Dispense  Refill  . insulin glargine (LANTUS) 100 UNIT/ML injection   Subcutaneous   Inject 30 Units into the skin at bedtime.         . insulin lispro (HUMALOG) 100 UNIT/ML injection      TAKE 5 UNITS WITH BREAKFAST, LUNCH, AND DINNER.   ALSO,  USE SLIDING SCALE AS FOLLOWS FOR BREAKFAST, LUNCH, DINNER, AND BEDTIME: FOR BLOOD SUGAR 121-150 TAKE 1 UNIT OF INSULIN. FOR BLOOD SUGAR 151-200 TAKE 2 UNITS. FOR BLOOD SUGAR 201-250 TAKE 3 UNITS. FOR BLOOD SUGAR 251-300 TAKE 5 UNITS. FOR BLOOD SUGAR 301-350 TAKE 7 UNITS. FOR BLOOD SUGAR 351-400 TAKE 9 UNITS. FOR BLOOD SUGAR GREATER THAN 400 TAKE 12 UNITS AND CALL YOUR DOCTOR.   3 mL   12   . levothyroxine (SYNTHROID, LEVOTHROID) 75 MCG tablet   Oral   Take 1 tablet (75 mcg total) by mouth daily.   30 tablet   3   . EXPIRED: levETIRAcetam (KEPPRA) 500 MG tablet   Oral   Take 1 tablet (500 mg total) by mouth 2 (two) times daily.   60 tablet   3    There were no vitals taken for this visit. Physical Exam  Nursing note and vitals reviewed. Constitutional: He is oriented to person, place, and time. He appears well-developed and well-nourished. No distress.  HENT:  Right Ear:  External ear normal.  Left Ear: External ear normal.  Mouth/Throat: Oropharynx is clear and moist.  There is a contusion to the right upper for head was no laceration. There is no palpable skull defect. There is dried blood in the nares and on the upper lip. There is no septal hematoma and no obvious nasal deformity.  Eyes: EOM are normal. Pupils are equal, round, and reactive to light.  Neck: Normal range of motion. Neck supple.  There is no cervical spine tenderness to palpation he appears to have good range of motion without difficulty.  Cardiovascular: Normal rate, regular rhythm and normal heart sounds.   No murmur heard. Pulmonary/Chest: Effort  normal and breath sounds normal. No respiratory distress. He has no wheezes.  Abdominal: Soft. Bowel sounds are normal. He exhibits no distension. There is no tenderness.  Genitourinary:  It appears as though he has had incontinence of bowel and bladder.  Musculoskeletal: Normal range of motion. He exhibits no edema.  Neurological: He is alert and oriented to person, place, and time. No cranial nerve deficit. He exhibits normal muscle tone. Coordination normal.  Skin: Skin is warm and dry. He is not diaphoretic.    ED Course  Procedures (including critical care time) Labs Review Labs Reviewed  CBC WITH DIFFERENTIAL  COMPREHENSIVE METABOLIC PANEL  URINE RAPID DRUG SCREEN (HOSP PERFORMED)  PHENYTOIN LEVEL, TOTAL   Imaging Review No results found.  EKG Interpretation     Ventricular Rate:  81 PR Interval:  152 QRS Duration: 84 QT Interval:  392 QTC Calculation: 455 R Axis:   63 Text Interpretation:  Normal sinus rhythm Normal ECG When compared with ECG of 30-Oct-2011 19:41, No significant change was found            MDM  No diagnosis found. Patient was brought here by EMS after what I believe is his seizure while he was in the jail. He has a bump on his for head and had been bleeding from the nose. He was also incontinent of urine and stool. He has been off of his Keppra and he was given a dose by IV. He was also hypoglycemic on seen but this has improved with peanut butter and crackers and IV D50. Workup was unremarkable including CT of the head and cervical spine. Laboratory studies reveal him to be hypoglycemic and hyponatremic. He was given a liter of fluid to help correct this. At this point he has awake, alert, and in no distress. Feels that he is stable for discharge. I will prescribe his Keppra for him and recommended he go back on this.    Geoffery Lyons, MD 03/11/13 [phone]

## 2013-03-11 NOTE — ED Notes (Signed)
Pt alert & oriented x4, stable gait. Patient  given discharge instructions, paperwork & prescription(s). Patient stating he would have to call someone to pick him up that was at work now but would be off in the morning. Pt instructed he can use phone in room or waiting area to call. Advised of bus avaliabe in the morning. Pt states he does not have money for script, pt advised to come back to hospital to speak with case management about assistance with medications. Patient  verbalized understanding. Pt left department w/ no further questions.

## 2013-03-11 NOTE — ED Notes (Signed)
CBG 121. EDP notified

## 2013-03-11 NOTE — ED Notes (Signed)
Pt [facility] ems [facility] jail. Deputy w/ pt. Reported pt found face down in cell when glucose checked reading was low. Pt has bloody nose when arrived. cbg 123 here.

## 2013-03-11 NOTE — ED Notes (Signed)
Pt woke & given peanut butter, crackers & drink.

## 2014-12-08 ENCOUNTER — Ambulatory Visit: Payer: Self-pay | Admitting: Neurology

## 2015-08-11 ENCOUNTER — Encounter: Payer: Self-pay | Admitting: Physician Assistant

## 2015-08-11 ENCOUNTER — Ambulatory Visit: Payer: Self-pay | Admitting: Physician Assistant

## 2015-08-11 VITALS — BP 122/70 | HR 76 | Temp 98.1°F | Ht 63.5 in | Wt 124.0 lb

## 2015-08-11 DIAGNOSIS — B353 Tinea pedis: Secondary | ICD-10-CM

## 2015-08-11 DIAGNOSIS — Z91199 Patient's noncompliance with other medical treatment and regimen due to unspecified reason: Secondary | ICD-10-CM

## 2015-08-11 DIAGNOSIS — E108 Type 1 diabetes mellitus with unspecified complications: Secondary | ICD-10-CM

## 2015-08-11 DIAGNOSIS — E039 Hypothyroidism, unspecified: Secondary | ICD-10-CM

## 2015-08-11 DIAGNOSIS — Z9119 Patient's noncompliance with other medical treatment and regimen: Secondary | ICD-10-CM

## 2015-08-11 LAB — BASIC METABOLIC PANEL
BUN: 12 mg/dL (ref 7–25)
CALCIUM: 8.9 mg/dL (ref 8.6–10.3)
CHLORIDE: 94 mmol/L — AB (ref 98–110)
CO2: 28 mmol/L (ref 20–31)
CREATININE: 0.86 mg/dL (ref 0.60–1.35)
GLUCOSE: 378 mg/dL — AB (ref 65–99)
Potassium: 4 mmol/L (ref 3.5–5.3)
Sodium: 132 mmol/L — ABNORMAL LOW (ref 135–146)

## 2015-08-11 LAB — TSH: TSH: 134.12 m[IU]/L — AB (ref 0.40–4.50)

## 2015-08-11 NOTE — Progress Notes (Signed)
BP 122/70 mmHg  Pulse 76  Temp(Src) 98.1 F (36.7 C)  Ht 5' 3.5" (1.613 m)  Wt 124 lb (56.246 kg)  BMI 21.62 kg/m2  SpO2 99%   Subjective:    Patient ID: Kevin Canales., male    DOB: 1980/11/09, 35 y.o.   MRN: [id number]  HPI: Kevin Lollar. is a 35 y.o. male presenting on 08/11/2015 for Follow-up   HPI   Pt was supposed to f/u in September 2016 but he no-showed.  He also no-showed to an appointment in October 2016.  He never bothered to reschedule an appointment since then.    Pt says he will be getting insurance beginning of June  Pt never went to neurology last year- says didn't get approved for [facility] discount  Relevant past medical, surgical, family and social history reviewed and updated as indicated. Interim medical history since our last visit reviewed. Allergies and medications reviewed and updated.  Current outpatient prescriptions:  .  Insulin Aspart (NOVOLOG [place]), Inject 1-5 Units into the skin 3 (three) times daily before meals., Disp: , Rfl:  .  insulin glargine (LANTUS) 100 UNIT/ML injection, Inject 30 Units into the skin at bedtime., Disp: , Rfl:  .  levothyroxine (SYNTHROID, LEVOTHROID) 75 MCG tablet, Take 1 tablet (75 mcg total) by mouth daily., Disp: 30 tablet, Rfl: 3   Review of Systems  Constitutional: Negative for fever, chills, diaphoresis, appetite change, fatigue and unexpected weight change.  HENT: Positive for congestion and sneezing. Negative for dental problem, drooling, ear pain, facial swelling, hearing loss, mouth sores, sore throat, trouble swallowing and voice change.   Eyes: Negative for pain, discharge, redness, itching and visual disturbance.  Respiratory: Negative for cough, choking, shortness of breath and wheezing.   Cardiovascular: Negative for chest pain, palpitations and leg swelling.  Gastrointestinal: Negative for vomiting, abdominal pain, diarrhea, constipation and blood in stool.  Endocrine: Negative for cold intolerance, heat  intolerance and polydipsia.  Genitourinary: Negative for dysuria, hematuria and decreased urine volume.  Musculoskeletal: Positive for arthralgias. Negative for back pain and gait problem.  Skin: Positive for rash.  Allergic/Immunologic: Positive for environmental allergies.  Neurological: Negative for seizures, syncope, light-headedness and headaches.  Hematological: Negative for adenopathy.  Psychiatric/Behavioral: Negative for suicidal ideas, dysphoric mood and agitation. The patient is not nervous/anxious.     Per HPI unless specifically indicated above     Objective:    BP 122/70 mmHg  Pulse 76  Temp(Src) 98.1 F (36.7 C)  Ht 5' 3.5" (1.613 m)  Wt 124 lb (56.246 kg)  BMI 21.62 kg/m2  SpO2 99%  Wt Readings from Last 3 Encounters:  08/11/15 124 lb (56.246 kg)  03/11/13 118 lb (53.524 kg)  11/02/11 127 lb 13.9 oz (58 kg)    Physical Exam  Constitutional: He is oriented to person, place, and time. He appears well-developed and well-nourished.  HENT:  Head: Normocephalic and atraumatic.  Neck: Neck supple.  Cardiovascular: Normal rate and regular rhythm.   Pulmonary/Chest: Effort normal and breath sounds normal. He has no wheezes.  Abdominal: Soft. Bowel sounds are normal. There is no hepatosplenomegaly. There is no tenderness.  Musculoskeletal: He exhibits no edema.  Lymphadenopathy:    He has no cervical adenopathy.  Neurological: He is alert and oriented to person, place, and time.  Skin: Skin is warm and dry.  Tinea bilateral feet.  No maceration or secondary infection  Psychiatric: He has a normal mood and affect. His behavior is normal.  Vitals reviewed.  Assessment & Plan:   Encounter Diagnoses  Name Primary?  . Personal history of noncompliance with medical treatment, presenting hazards to health Yes  . Type 1 diabetes mellitus with complication (HCC)   . Hypothyroidism, unspecified hypothyroidism type   . Tinea pedis of both feet     -Get labs  drawn- will call with results -otc antifungal cream to feet -No f/u scheduled due to pt gets insurance first week of June.  Pt counseled that if he doesn't get inusrance, he will need to f/u here in 3 months.  He states understanding

## 2015-08-12 LAB — HEMOGLOBIN A1C
Hgb A1c MFr Bld: 10.7 % — ABNORMAL HIGH (ref <5.7)
Mean Plasma Glucose: 260 mg/dL — ABNORMAL HIGH (ref <117)

## 2015-08-12 LAB — MICROALBUMIN, URINE: MICROALB UR: 0.3 mg/dL

## 2015-08-30 ENCOUNTER — Ambulatory Visit: Payer: Self-pay | Admitting: Physician Assistant

## 2015-08-31 ENCOUNTER — Encounter: Payer: Self-pay | Admitting: Physician Assistant

## 2015-10-19 ENCOUNTER — Other Ambulatory Visit: Payer: Self-pay | Admitting: Physician Assistant

## 2015-10-19 MED ORDER — INSULIN GLARGINE 100 UNIT/ML ~~LOC~~ SOLN: 30.0000 [IU] | Freq: Every day | SUBCUTANEOUS | Status: DC

## 2016-03-02 DIAGNOSIS — J309 Allergic rhinitis, unspecified: Secondary | ICD-10-CM | POA: Insufficient documentation

## 2017-01-24 ENCOUNTER — Emergency Department (HOSPITAL_COMMUNITY)
Admission: EM | Admit: 2017-01-24 | Discharge: 2017-01-24 | Disposition: A | Discharge status: Home / Self Care | Payer: BLUE CROSS/BLUE SHIELD | Attending: Emergency Medicine | Admitting: Emergency Medicine

## 2017-01-24 ENCOUNTER — Emergency Department (HOSPITAL_COMMUNITY): Payer: BLUE CROSS/BLUE SHIELD

## 2017-01-24 ENCOUNTER — Encounter (HOSPITAL_COMMUNITY): Payer: Self-pay | Admitting: Emergency Medicine

## 2017-01-24 DIAGNOSIS — M25571 Pain in right ankle and joints of right foot: Secondary | ICD-10-CM | POA: Diagnosis not present

## 2017-01-24 DIAGNOSIS — E109 Type 1 diabetes mellitus without complications: Secondary | ICD-10-CM | POA: Diagnosis not present

## 2017-01-24 DIAGNOSIS — Z79899 Other long term (current) drug therapy: Secondary | ICD-10-CM | POA: Insufficient documentation

## 2017-01-24 DIAGNOSIS — M25561 Pain in right knee: Secondary | ICD-10-CM | POA: Insufficient documentation

## 2017-01-24 DIAGNOSIS — X500XXA Overexertion from strenuous movement or load, initial encounter: Secondary | ICD-10-CM | POA: Diagnosis not present

## 2017-01-24 DIAGNOSIS — Z794 Long term (current) use of insulin: Secondary | ICD-10-CM | POA: Diagnosis not present

## 2017-01-24 DIAGNOSIS — E039 Hypothyroidism, unspecified: Secondary | ICD-10-CM | POA: Diagnosis not present

## 2017-01-24 DIAGNOSIS — G8929 Other chronic pain: Secondary | ICD-10-CM | POA: Diagnosis not present

## 2017-01-24 DIAGNOSIS — Z87891 Personal history of nicotine dependence: Secondary | ICD-10-CM | POA: Diagnosis not present

## 2017-01-24 DIAGNOSIS — M25551 Pain in right hip: Secondary | ICD-10-CM | POA: Diagnosis not present

## 2017-01-24 HISTORY — DX: Other chronic pain: G89.29

## 2017-01-24 HISTORY — DX: Pain in right hip: M25.551

## 2017-01-24 IMAGING — DX DG HIP (WITH OR WITHOUT PELVIS) 2-3V*R*
3 series · 3 of 3 positions shown · non-contrast
Comparison: None.

CLINICAL DATA: Chronic right hip pain without recent injury.

EXAM:
DG HIP (WITH OR WITHOUT PELVIS) 2-3V RIGHT

[pelvis ap]
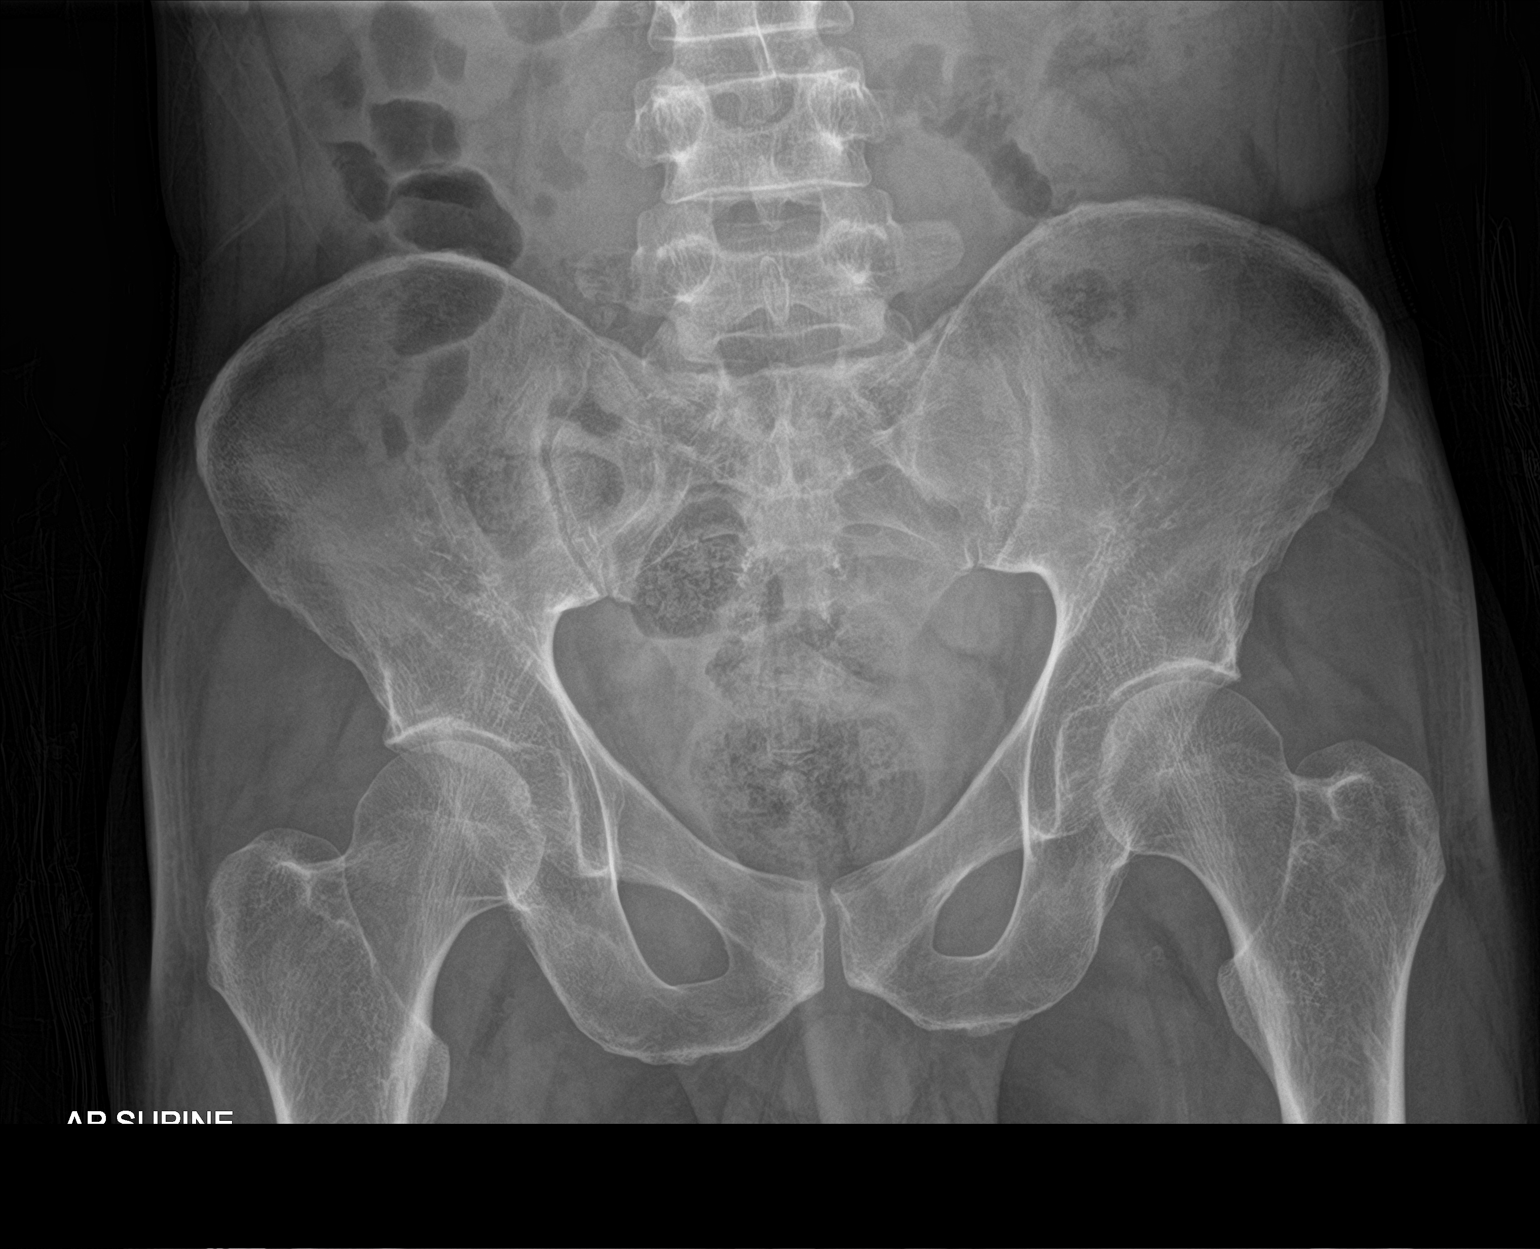

[hip ap]
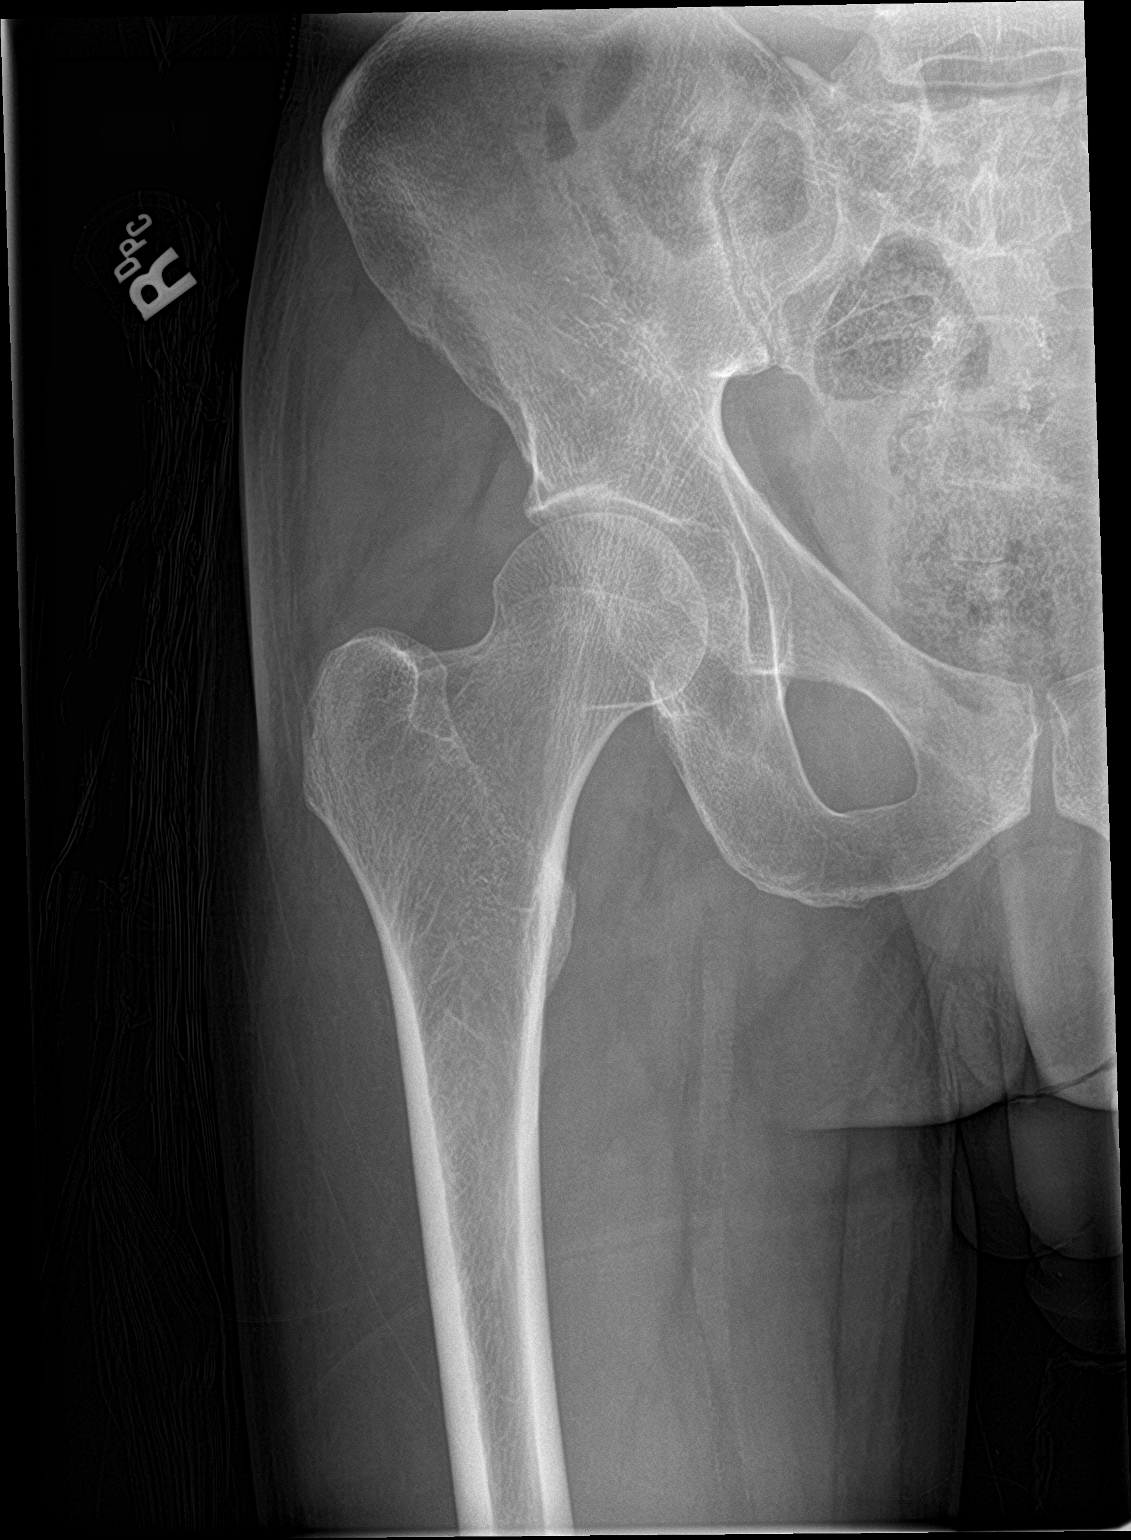

[hip lat]
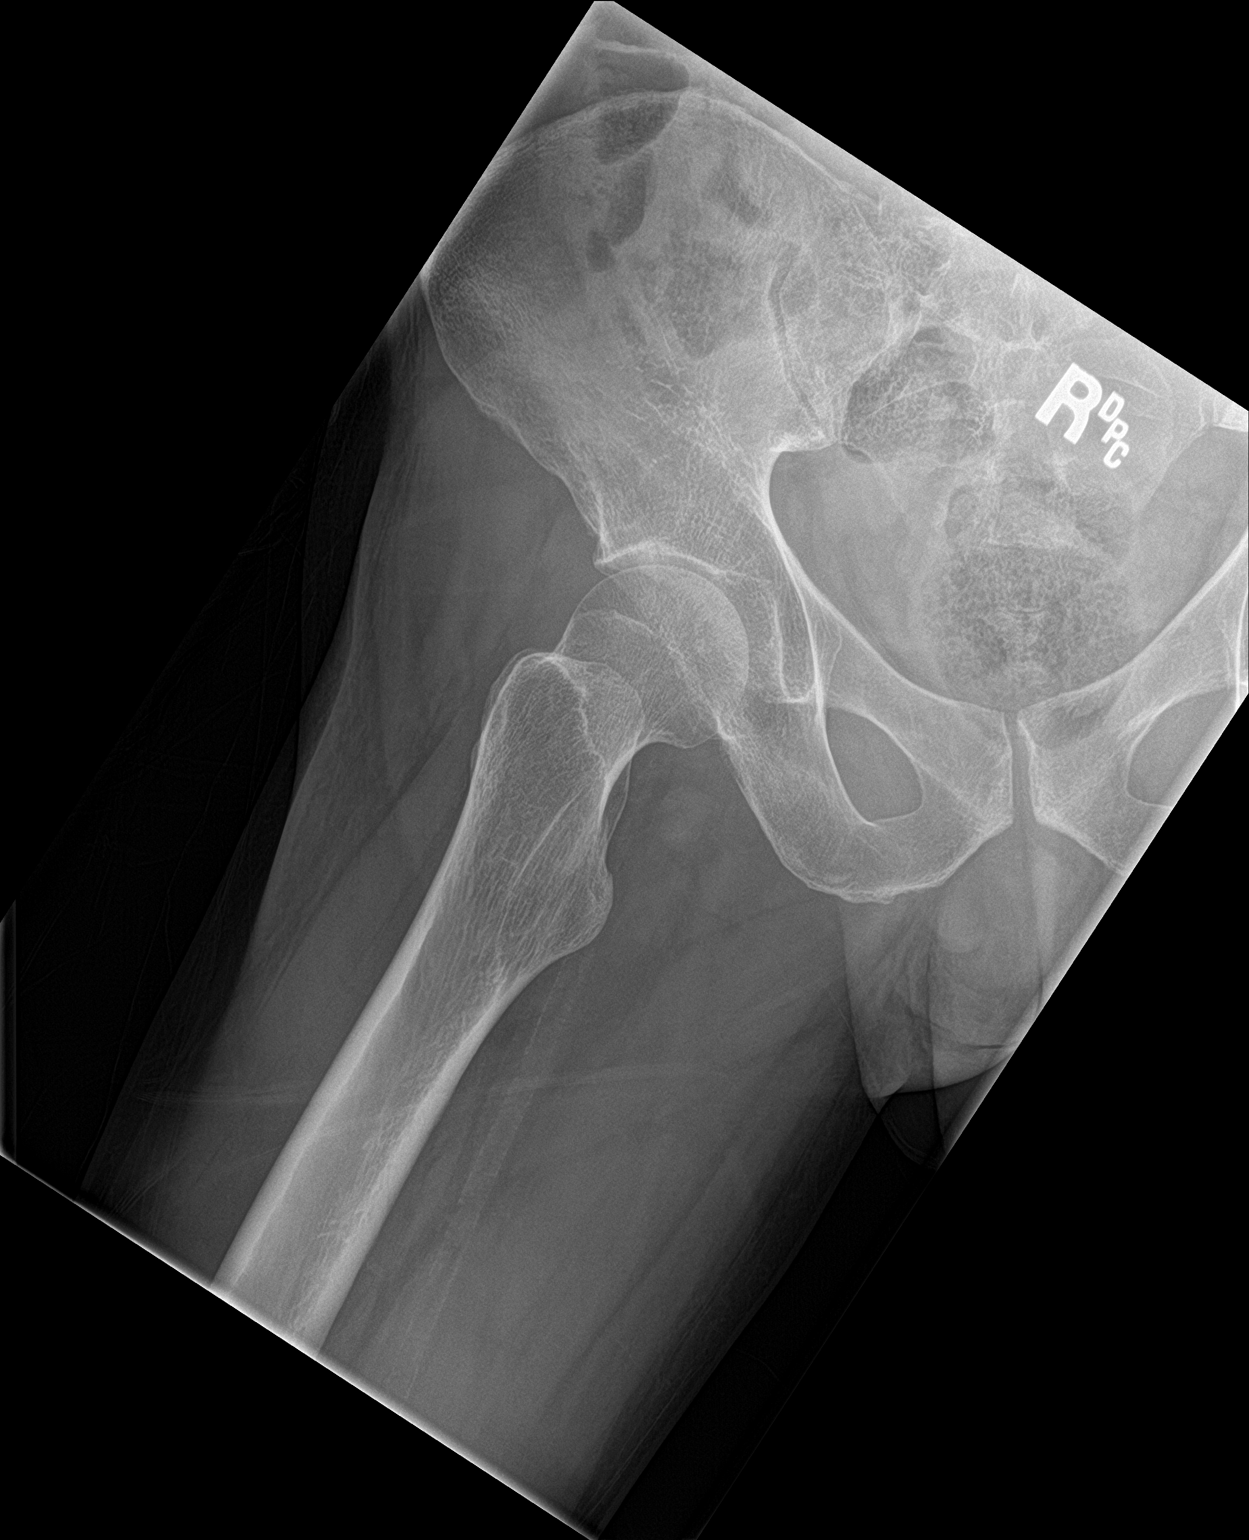

[3 of 3 positions shown; findings below may reference images not displayed]

FINDINGS: There is no evidence of hip fracture or dislocation. There is no
evidence of arthropathy or other focal bone abnormality.
IMPRESSION: Normal right hip.

## 2017-01-24 MED ORDER — NAPROXEN 250 MG PO TABS: 250.0000 mg | ORAL_TABLET | Freq: Two times a day (BID) | ORAL | 0 refills | Status: DC | PRN

## 2017-01-24 MED ORDER — METHOCARBAMOL 500 MG PO TABS: 1000.0000 mg | ORAL_TABLET | Freq: Four times a day (QID) | ORAL | 0 refills | Status: DC | PRN

## 2017-01-24 NOTE — Discharge Instructions (Signed)
Take the prescriptions as directed. Also take over the counter tylenol, as directed on packaging, as needed for discomfort. Apply moist heat or ice to the area(s) of discomfort, for 15 minutes at a time, several times per day for the next few days.  Do not fall asleep on a heating or ice pack.  Call your regular medical doctor tomorrow to schedule a follow up appointment within the next week.  Return to the Emergency Department immediately if worsening.

## 2017-01-24 NOTE — ED Triage Notes (Signed)
Patient has right hip, right knee, and right ankle pain x 1 week, has tried ibuprofen, ice, heat, rest, without any relief.  No injury noted.

## 2017-01-24 NOTE — ED Provider Notes (Signed)
[facility] DEPT Provider Note   CSN: [id number] Arrival date & time: 01/24/17  2022     History   Chief Complaint Chief Complaint  Patient presents with  . Hip Pain    HPI Kevin CanalesJerry Weyman Jr. is a 36 y.o. male.  HPI  Pt was seen at 2050. Per pt, c/o gradual onset and persistence of constant acute flair of his chronic right hip "pain" for the past several years, worse over the past several weeks. Pt c/o pain in his right hip, knee and ankle. Pt states his pain worsened when he "started doing something new" at his job, "pushing" heavy objects in a cart. Denies direct injury. Denies focal motor weakness, no tingling/numbness in extremities, no fevers, no rash, no abd pain. The symptoms have been associated with no other complaints. The patient has a significant history of similar symptoms previously, being evaluated for this complaint by his PMD. Pt did not f/u with Ortho MD as instructed.     Past Medical History:  Diagnosis Date  . Chronic right hip pain   . Diabetes mellitus    diagnosed age 89  . Seizures (HCC)    last one january 2016 due to low bs  . Thyroid disease     Patient Active Problem List   Diagnosis Date Noted  . DM (diabetes mellitus), type 1 (HCC) 10/31/2011  . Hypoglycemia 10/31/2011  . Convulsions/seizures (HCC) 10/30/2011  . Dilantin toxicity 10/30/2011  . Hypothyroidism 10/30/2011  . Tinea pedis 10/30/2011    History reviewed. No pertinent surgical history.     Home Medications    Prior to Admission medications   Medication Sig Start Date End Date Taking? Authorizing Provider  Insulin Aspart (NOVOLOG [place]) Inject 1-5 Units into the skin 3 (three) times daily before meals.   Yes [provider]  insulin glargine (LANTUS) 100 UNIT/ML injection Inject 0.3 mLs (30 Units total) into the skin at bedtime. Patient taking differently: Inject 20 Units into the skin every morning.  10/19/15  Yes Jacquelin HawkingMcElroy, Shannon, PA-C  loratadine (CLARITIN) 10 MG  tablet Take 10 mg by mouth daily as needed for allergies.  03/02/16 03/02/17 Yes [provider]  Multiple Vitamin (MULTIVITAMIN WITH MINERALS) TABS tablet Take 1 tablet by mouth daily.   Yes [provider]  levothyroxine (SYNTHROID, LEVOTHROID) 75 MCG tablet Take 1 tablet (75 mcg total) by mouth daily. 11/02/11   Elliot CousinFisher, Denise, MD    Family History Family History  Problem Relation Age of Onset  . Cancer Mother   . Diabetes Mother   . Heart disease Mother   . Thyroid disease Mother     Social History Social History  Substance Use Topics  . Smoking status: Former Smoker    Packs/day: 2.00    Years: 4.00    Types: Cigarettes    Quit date: 05/29/2003  . Smokeless tobacco: Current User    Types: Chew  . Alcohol use No     Allergies   Penicillins and Tomato   Review of Systems Review of Systems ROS: Statement: All systems negative except as marked or noted in the HPI; Constitutional: Negative for fever and chills. ; ; Eyes: Negative for eye pain, redness and discharge. ; ; ENMT: Negative for ear pain, hoarseness, nasal congestion, sinus pressure and sore throat. ; ; Cardiovascular: Negative for chest pain, palpitations, diaphoresis, dyspnea and peripheral edema. ; ; Respiratory: Negative for cough, wheezing and stridor. ; ; Gastrointestinal: Negative for nausea, vomiting, diarrhea, abdominal pain, blood in  stool, hematemesis, jaundice and rectal bleeding. . ; ; Genitourinary: Negative for dysuria, flank pain and hematuria. ; ; Musculoskeletal: +right hip, knee, ankle pain. Negative for back pain and neck pain. Negative for swelling and trauma.; ; Skin: Negative for pruritus, rash, abrasions, blisters, bruising and skin lesion.; ; Neuro: Negative for headache, lightheadedness and neck stiffness. Negative for weakness, altered level of consciousness, altered mental status, extremity weakness, paresthesias, involuntary movement, seizure and syncope.       Physical  Exam Updated Vital Signs BP 112/76 (BP Location: Left Arm)   Pulse 82   Temp 98.2 F (36.8 C) (Oral)   Resp 18   Ht 5\' 5"  (1.651 m)   Wt 56.2 kg (124 lb)   SpO2 99%   BMI 20.63 kg/m   Physical Exam 2055: Physical examination:  Nursing notes reviewed; Vital signs and O2 SAT reviewed;  Constitutional: Well developed, Well nourished, Well hydrated, In no acute distress; Head:  Normocephalic, atraumatic; Eyes: EOMI, PERRL, No scleral icterus; ENMT: Mouth and pharynx normal, Mucous membranes moist; Neck: Supple, Full range of motion, No lymphadenopathy; Cardiovascular: Regular rate and rhythm, No gallop; Respiratory: Breath sounds clear & equal bilaterally, No wheezes.  Speaking full sentences with ease, Normal respiratory effort/excursion; Chest: Nontender, Movement normal; Abdomen: Soft, Nontender, Nondistended, Normal bowel sounds; Genitourinary: No CVA tenderness; Spine:  No midline CS, TS, LS tenderness.;; Extremities: Pulses normal, Pelvis stable. No tenderness right hip/knee/ankle when distracted. Pt is able to lift extended LE's off stretcher without difficulty. +FROM right knee, including able to lift extended RLE off stretcher, and extend right lower leg against resistance.  No ligamentous laxity.  No patellar or quad tendon step-offs.  NMS intact right foot, strong pedal pp. +plantarflexion of right foot w/calf squeeze.  No palpable gap right Achilles's tendon.  No proximal fibular head tenderness.  No edema, erythema, warmth, ecchymosis or deformity. No edema, No calf tenderness, edema or asymmetry.; Neuro: AA&Ox3, Major CN grossly intact.  Speech clear. No gross focal motor or sensory deficits in extremities.; Skin: Color normal, Warm, Dry.   ED Treatments / Results  Labs (all labs ordered are listed, but only abnormal results are displayed)   EKG  EKG Interpretation None       Radiology   Procedures Procedures (including critical care time)  Medications Ordered in  ED Medications - No data to display   Initial Impression / Assessment and Plan / ED Course  I have reviewed the triage vital signs and the nursing notes.  Pertinent labs & imaging results that were available during my care of the patient were reviewed by me and considered in my medical decision making (see chart for details).  MDM Reviewed: previous chart, nursing note and vitals Interpretation: x-ray   Dg Lumbar Spine Complete Result Date: 01/24/2017 CLINICAL DATA:  Low back pain and right lower extremity pain. No recent trauma. EXAM: LUMBAR SPINE - COMPLETE 4+ VIEW COMPARISON:  None. FINDINGS: The lumbar vertebrae are normal in height. There is no spondylolysis or spondylolisthesis. Excellent preservation of intervertebral disc spaces. Facet articulations are unremarkable. Sacroiliac joints are unremarkable. No bone lesion or bony destruction. No fracture or other acute bone abnormality. IMPRESSION: Negative. Electronically Signed   By: Ellery Plunk M.D.   On: 01/24/2017 21:43   Dg Ankle Complete Right Result Date: 01/24/2017 CLINICAL DATA:  Right lower extremity pain, nontraumatic. EXAM: RIGHT ANKLE - COMPLETE 3+ VIEW COMPARISON:  None. FINDINGS: There is no evidence of fracture, dislocation, or joint effusion. There is  no evidence of arthropathy or other focal bone abnormality. Soft tissues are unremarkable. IMPRESSION: Negative. Electronically Signed   By: Ellery Plunk M.D.   On: 01/24/2017 21:43   Dg Knee Complete 4 Views Right Result Date: 01/24/2017 CLINICAL DATA:  Right lower extremity pain.  No recent trauma. EXAM: RIGHT KNEE - COMPLETE 4+ VIEW COMPARISON:  None. FINDINGS: No evidence of fracture, dislocation, or joint effusion. No evidence of arthropathy or other focal bone abnormality. Soft tissues are unremarkable. IMPRESSION: Negative. Electronically Signed   By: Ellery Plunk M.D.   On: 01/24/2017 21:44   Dg Hip Unilat With Pelvis 2-3 Views Right Result Date:  01/24/2017 CLINICAL DATA:  Chronic right hip pain without recent injury. EXAM: DG HIP (WITH OR WITHOUT PELVIS) 2-3V RIGHT COMPARISON:  None. FINDINGS: There is no evidence of hip fracture or dislocation. There is no evidence of arthropathy or other focal bone abnormality. IMPRESSION: Normal right hip. Electronically Signed   By: Lupita Raider, M.D.   On: 01/24/2017 21:43     2150:  XR reassuring. Pt is asking me to write a work note for him "until I can see my new doctor" several weeks away. I explained that the ED is not the proper venue for that amount of time off work, and deferred him back to his PMD. Will tx chronic pain symptomatically at this time. Dx and testing d/w pt and family.  Questions answered.  Verb understanding, agreeable to d/c home with outpt f/u.   Final Clinical Impressions(s) / ED Diagnoses   Final diagnoses:  None    New Prescriptions New Prescriptions   No medications on file     Samuel Jester, DO 01/27/17 2023

## 2017-02-19 DIAGNOSIS — M25551 Pain in right hip: Secondary | ICD-10-CM | POA: Insufficient documentation

## 2017-04-10 LAB — HEMOGLOBIN A1C: HEMOGLOBIN A1C: 10.3

## 2017-05-14 ENCOUNTER — Ambulatory Visit: Payer: Self-pay | Admitting: "Endocrinology

## 2018-03-07 LAB — HEMOGLOBIN A1C: Hemoglobin A1C: 10.4

## 2018-05-19 ENCOUNTER — Ambulatory Visit (INDEPENDENT_AMBULATORY_CARE_PROVIDER_SITE_OTHER): Payer: BLUE CROSS/BLUE SHIELD | Admitting: "Endocrinology

## 2018-05-19 ENCOUNTER — Encounter: Payer: Self-pay | Admitting: "Endocrinology

## 2018-05-19 VITALS — BP 118/80 | HR 78 | Resp 12 | Ht 65.0 in | Wt 126.4 lb

## 2018-05-19 DIAGNOSIS — E039 Hypothyroidism, unspecified: Secondary | ICD-10-CM

## 2018-05-19 DIAGNOSIS — E1065 Type 1 diabetes mellitus with hyperglycemia: Secondary | ICD-10-CM | POA: Diagnosis not present

## 2018-05-19 MED ORDER — GLUCOSE BLOOD VI STRP: ORAL_STRIP | 2 refills | Status: DC

## 2018-05-19 MED ORDER — LEVOTHYROXINE SODIUM 88 MCG PO TABS: 88.0000 ug | ORAL_TABLET | Freq: Every day | ORAL | 3 refills | Status: DC

## 2018-05-19 NOTE — Progress Notes (Signed)
Endocrinology Consult Note       05/19/2018, 2:27 PM   Subjective:    Patient ID: Kevin CanalesJerry Denker Jr., male    DOB: 06/30/1980.  Kevin CanalesJerry Staunton Jr. is being seen in consultation for management of currently uncontrolled symptomatic diabetes requested by  Kirstie PeriShah, Ashish, MD.   Past Medical History:  Diagnosis Date  . Chronic right hip pain   . Diabetes mellitus    diagnosed age 89  . Seizures (HCC)    last one january 2016 due to low bs  . Thyroid disease    History reviewed. No pertinent surgical history. Social History   Socioeconomic History  . Marital status: Legally Separated    Spouse name: Not on file  . Number of children: Not on file  . Years of education: Not on file  . Highest education level: Not on file  Occupational History  . Not on file  Social Needs  . Financial resource strain: Not on file  . Food insecurity:    Worry: Not on file    Inability: Not on file  . Transportation needs:    Medical: Not on file    Non-medical: Not on file  Tobacco Use  . Smoking status: Former Smoker    Packs/day: 2.00    Years: 4.00    Pack years: 8.00    Types: Cigarettes    Last attempt to quit: 05/29/2003    Years since quitting: 14.9  . Smokeless tobacco: Current User    Types: Chew  Substance and Sexual Activity  . Alcohol use: No  . Drug use: No  . Sexual activity: Yes    Birth control/protection: Condom  Lifestyle  . Physical activity:    Days per week: Not on file    Minutes per session: Not on file  . Stress: Not on file  Relationships  . Social connections:    Talks on phone: Not on file    Gets together: Not on file    Attends religious service: Not on file    Active member of club or organization: Not on file    Attends meetings of clubs or organizations: Not on file    Relationship status: Not on file  Other Topics Concern  . Not on file  Social History Narrative  . Not on  file   Outpatient Encounter Medications as of 05/19/2018  Medication Sig  . gabapentin (NEURONTIN) 300 MG capsule Take 300 mg by mouth 2 (two) times daily.  Marland Kitchen. ibuprofen (ADVIL,MOTRIN) 200 MG tablet Take 200 mg by mouth every 6 (six) hours as needed (pain).  . Insulin Aspart (NOVOLOG [place]) Inject 5-11 Units into the skin 3 (three) times daily before meals.  . Insulin Degludec (TRESIBA [place]) Inject 20 Units into the skin at bedtime.  Marland Kitchen. levothyroxine (SYNTHROID, LEVOTHROID) 88 MCG tablet Take 1 tablet (88 mcg total) by mouth daily.  . naproxen (NAPROSYN) 250 MG tablet Take 1 tablet (250 mg total) by mouth 2 (two) times daily as needed for mild pain or moderate pain (take with food).  . [DISCONTINUED] levothyroxine (SYNTHROID, LEVOTHROID) 75 MCG tablet Take 1 tablet (75 mcg total)  by mouth daily.  Marland Kitchen glucose blood (ONETOUCH VERIO) test strip Use as instructed  . [DISCONTINUED] insulin glargine (LANTUS) 100 UNIT/ML injection Inject 0.3 mLs (30 Units total) into the skin at bedtime. (Patient not taking: Reported on 05/19/2018)  . [DISCONTINUED] methocarbamol (ROBAXIN) 500 MG tablet Take 2 tablets (1,000 mg total) by mouth 4 (four) times daily as needed for muscle spasms (muscle spasm/pain). (Patient not taking: Reported on 05/19/2018)  . [DISCONTINUED] Multiple Vitamin (MULTIVITAMIN WITH MINERALS) TABS tablet Take 1 tablet by mouth daily.   No facility-administered encounter medications on file as of 05/19/2018.     ALLERGIES: Allergies  Allergen Reactions  . Tomato Hives, Itching and Rash  . Penicillins Other (See Comments)    UNKNOWN REACTION    VACCINATION STATUS:  There is no immunization history on file for this patient.  Diabetes  He presents for his initial diabetic visit. He has type 1 diabetes mellitus. Onset time: He was diagnosed at approximate age of 8 years. His disease course has been worsening (He has had 2 prior episodes of diabetes ketoacidosis.  His most recent 4 readings of  A1c's are greater than 10%.). Hypoglycemia symptoms include dizziness, hunger and sweats. Pertinent negatives for hypoglycemia include no confusion, headaches, nervousness/anxiousness, pallor or seizures. Associated symptoms include polydipsia, polyuria and weakness. Pertinent negatives for diabetes include no chest pain, no fatigue and no polyphagia. There are no hypoglycemic complications. Symptoms are worsening. Diabetic complications include peripheral neuropathy. Risk factors for coronary artery disease include diabetes mellitus, tobacco exposure, sedentary lifestyle, family history and male sex. Current diabetic treatment includes insulin injections (He is currently on Tresiba 35 units every morning and NovoLog sliding scale.). His weight is stable. He is following a generally unhealthy diet. When asked about meal planning, he reported none. He has not had a previous visit with a dietitian. He rarely participates in exercise. (He did not bring any meter nor logs to review today.  His recent A1c was 10.4% on March 13, 2018.) An ACE inhibitor/angiotensin II receptor blocker is not being taken. Eye exam is current.  Thyroid Problem  Presents for initial visit. Patient reports no anxiety, cold intolerance, constipation, diarrhea, fatigue, heat intolerance or palpitations. The symptoms have been stable. Past treatments include levothyroxine (Is currently on levothyroxine 75 mcg p.o. nightly.). His past medical history is significant for diabetes. Risk factors include family history of hypothyroidism.      Review of Systems  Constitutional: Negative for chills, fatigue, fever and unexpected weight change.  HENT: Negative for dental problem, mouth sores and trouble swallowing.   Eyes: Negative for visual disturbance.  Respiratory: Negative for cough, choking, chest tightness, shortness of breath and wheezing.   Cardiovascular: Negative for chest pain, palpitations and leg swelling.  Gastrointestinal:  Negative for abdominal distention, abdominal pain, constipation, diarrhea, nausea and vomiting.  Endocrine: Positive for polydipsia and polyuria. Negative for cold intolerance, heat intolerance and polyphagia.  Genitourinary: Negative for dysuria, flank pain, hematuria and urgency.  Musculoskeletal: Negative for back pain, gait problem, myalgias and neck pain.  Skin: Negative for pallor, rash and wound.  Neurological: Positive for dizziness and weakness. Negative for seizures, syncope, numbness and headaches.  Psychiatric/Behavioral: Negative.  Negative for confusion and dysphoric mood. The patient is not nervous/anxious.     Objective:    BP 118/80 (BP Location: Right Arm, Patient Position: Sitting, Cuff Size: Normal)   Pulse 78   Resp 12   Ht 5\' 5"  (1.651 m)   Wt 126 lb 6.4 oz (  57.3 kg)   SpO2 100% Comment: room air  BMI 21.03 kg/m   Wt Readings from Last 3 Encounters:  05/19/18 126 lb 6.4 oz (57.3 kg)  01/24/17 124 lb (56.2 kg)  08/11/15 124 lb (56.2 kg)     Physical Exam Constitutional:      General: He is not in acute distress.    Appearance: He is well-developed.     Comments: Poor personal hygiene.  Walks with a cane.  HENT:     Head: Normocephalic and atraumatic.  Neck:     Musculoskeletal: Normal range of motion and neck supple.     Thyroid: No thyromegaly.     Trachea: No tracheal deviation.  Cardiovascular:     Pulses:          Dorsalis pedis pulses are 1+ on the right side and 1+ on the left side.       Posterior tibial pulses are 1+ on the right side and 1+ on the left side.     Heart sounds: S1 normal and S2 normal. No murmur. No gallop.   Pulmonary:     Effort: No respiratory distress.     Breath sounds: No wheezing.  Abdominal:     General: There is no distension.     Tenderness: There is no abdominal tenderness. There is no guarding.  Musculoskeletal:        General: No swelling.     Right shoulder: He exhibits no swelling and no deformity.  Skin:     General: Skin is warm and dry.     Findings: No rash.     Nails: There is no clubbing.      Comments: Extensive tattoos.  Neurological:     Mental Status: He is alert and oriented to person, place, and time.     Cranial Nerves: No cranial nerve deficit.     Sensory: No sensory deficit.     Gait: Gait normal.     Deep Tendon Reflexes: Reflexes are normal and symmetric.  Psychiatric:        Speech: Speech normal.        Behavior: Behavior normal. Behavior is cooperative.       CMP ( most recent) CMP     Component Value Date/Time   NA 132 (L) 08/11/2015 1219   K 4.0 08/11/2015 1219   CL 94 (L) 08/11/2015 1219   CO2 28 08/11/2015 1219   GLUCOSE 378 (H) 08/11/2015 1219   BUN 12 08/11/2015 1219   CREATININE 0.86 08/11/2015 1219   CALCIUM 8.9 08/11/2015 1219   PROT 6.8 03/11/2013 0129   ALBUMIN 3.7 03/11/2013 0129   AST 31 03/11/2013 0129   ALT 24 03/11/2013 0129   ALKPHOS 97 03/11/2013 0129   BILITOT 0.4 03/11/2013 0129   GFRNONAA >90 03/11/2013 0129   GFRAA >90 03/11/2013 0129     Diabetic Labs (most recent): Lab Results  Component Value Date   HGBA1C 10.4 03/07/2018   HGBA1C 10.7 (H) 08/11/2015   HGBA1C 7.6 (H) 10/30/2011      Lab Results  Component Value Date   TSH 134.12 (H) 08/11/2015   TSH 32.191 (H) 10/30/2011   FREET4 0.45 (L) 11/01/2011      Assessment & Plan:   1. Uncontrolled type 1 diabetes mellitus with hyperglycemia (HCC) - Kevin Cooper. has currently uncontrolled symptomatic type 2 DM since 37 years of age,  with most recent A1c of 10.4 %. Recent labs reviewed. - I had a long discussion  with him about the progressive nature of diabetes and the pathology behind its complications. -his diabetes is complicated by peripheral neuropathy, tobacco use/abuse and he remains at a high risk for more acute and chronic complications which include CAD, CVA, CKD, retinopathy, and neuropathy. These are all discussed in detail with him.  - I have counseled  him on diet management by adopting a carbohydrate restricted/protein rich diet.  - Suggestion is made for him to avoid simple carbohydrates  from his diet including Cakes, Sweet Desserts, Ice Cream, Soda (diet and regular), Sweet Tea, Candies, Chips, Cookies, Store Bought Juices, Alcohol in Excess of  1-2 drinks a day, Artificial Sweeteners,  Coffee Creamer, and "Sugar-free" Products. This will help patient to have more stable blood glucose profile and potentially avoid unintended weight gain.  - I encouraged him to switch to  unprocessed or minimally processed complex starch and increased protein intake (animal or plant source), fruits, and vegetables.  - he is advised to stick to a routine mealtimes to eat 3 meals  a day and avoid unnecessary snacks ( to snack only to correct hypoglycemia).   - he will be scheduled with Norm SaltPenny Crumpton, RDN, CDE for individualized diabetes education.  - I have approached him with the following individualized plan to manage diabetes and patient agrees:   -He will continue to require intensive treatment with basal/bolus insulin in order for him to achieve and maintain control of diabetes to target. -He is approached for better engagement for strict monitoring of blood glucose 4 times a day-daily before meals and at bedtime. -I have advised him to lower his basal insulin significantly: Tresiba to 20 units nightly, readjust his prandial insulin NovoLog to 5 units 3 times a day with meals  for pre-meal BG readings of 90-150mg /dl, plus patient specific correction dose for unexpected hyperglycemia above 150mg /dl, associated with strict monitoring of glucose 4 times a day-before meals and at bedtime. - he is warned not to take insulin without proper monitoring per orders. - Adjustment parameters are given to him for hypo and hyperglycemia in writing. - he is encouraged to call clinic for blood glucose levels less than 70 or above 300 mg /dl. -He is not a candidate for  non-insulin medications for diabetes management. -He will be considered for CGM during his next visit.  - Patient specific target  A1c;  LDL, HDL, Triglycerides, and  Waist Circumference were discussed in detail.  2) Blood Pressure /Hypertension:  his blood pressure is  controlled to target.  He is not on any antihypertensive medications.    3) Lipids/Hyperlipidemia: No recent lipid panel to review.  He is not on statins.  He will be considered for fasting lipid panel on subsequent visits.    4)  Weight/Diet:  Body mass index is 21.03 kg/m.  He is not a candidate for weight loss.  CDE Consult will be initiated . Exercise, and detailed carbohydrates information provided  -  detailed on discharge instructions.  5) Chronic Care/Health Maintenance:  -he  Is not on ACEI/ARB and Statin medications and  is encouraged to initiate and continue to follow up with Ophthalmology, Dentist,  Podiatrist at least yearly or according to recommendations, and advised to quit tobacco use/abuse. I have recommended yearly flu vaccine and pneumonia vaccine at least every 5 years; moderate intensity exercise for up to 150 minutes weekly; and  sleep for at least 7 hours a day.  - he is  advised to maintain close follow up with Kirstie PeriShah, Ashish,  MD for primary care needs, as well as his other providers for optimal and coordinated care.  - Time spent with the patient: 35 minutes, of which >50% was spent in obtaining information about his symptoms, reviewing his previous labs, evaluations, and treatments, counseling him about his currently uncontrolled type 1 diabetes, hypothyroidism, and developing plans for long term treatment based on the latest recommendations.  Kevin Cooper. participated in the discussions, expressed understanding, and voiced agreement with the above plans.  All questions were answered to his satisfaction. he is encouraged to contact clinic should he have any questions or concerns prior to his return  visit.  Follow up plan: - Return in about 10 days (around 05/29/2018) for Follow up with Meter and Logs Only - no Labs.  Marquis Lunch, MD [facility] [address] [place], [place] [address] Phone: [phone]  Fax: [phone]    05/19/2018, 2:27 PM  This note was partially dictated with voice recognition software. Similar sounding words can be transcribed inadequately or may not  be corrected upon review.

## 2018-05-19 NOTE — Patient Instructions (Signed)

## 2018-06-10 ENCOUNTER — Encounter: Payer: Self-pay | Admitting: "Endocrinology

## 2018-06-10 ENCOUNTER — Ambulatory Visit (INDEPENDENT_AMBULATORY_CARE_PROVIDER_SITE_OTHER): Payer: BLUE CROSS/BLUE SHIELD | Admitting: "Endocrinology

## 2018-06-10 VITALS — BP 101/71 | HR 81 | Ht 65.0 in | Wt 126.0 lb

## 2018-06-10 DIAGNOSIS — E039 Hypothyroidism, unspecified: Secondary | ICD-10-CM | POA: Diagnosis not present

## 2018-06-10 DIAGNOSIS — E1065 Type 1 diabetes mellitus with hyperglycemia: Secondary | ICD-10-CM | POA: Diagnosis not present

## 2018-06-10 MED ORDER — FREESTYLE LIBRE 14 DAY READER DEVI: 1.0000 | Freq: Once | 0 refills | Status: AC

## 2018-06-10 MED ORDER — FREESTYLE LIBRE 14 DAY SENSOR MISC: 1.0000 | 2 refills | Status: DC

## 2018-06-10 NOTE — Patient Instructions (Signed)

## 2018-06-10 NOTE — Progress Notes (Signed)
Endocrinology follow-up note       06/10/2018, 1:01 PM   Subjective:    Patient ID: Kevin CanalesJerry Fulginiti Jr., male    DOB: 05/15/1981.  Kevin CanalesJerry Deininger Jr. is being seen in consultation for management of currently uncontrolled symptomatic type 1 diabetes, hypothyroidism. PMD:   Kirstie PeriShah, Ashish, MD.   Past Medical History:  Diagnosis Date  . Chronic right hip pain   . Diabetes mellitus    diagnosed age 89  . Seizures (HCC)    last one january 2016 due to low bs  . Thyroid disease    History reviewed. No pertinent surgical history. Social History   Socioeconomic History  . Marital status: Legally Separated    Spouse name: Not on file  . Number of children: Not on file  . Years of education: Not on file  . Highest education level: Not on file  Occupational History  . Not on file  Social Needs  . Financial resource strain: Not on file  . Food insecurity:    Worry: Not on file    Inability: Not on file  . Transportation needs:    Medical: Not on file    Non-medical: Not on file  Tobacco Use  . Smoking status: Former Smoker    Packs/day: 2.00    Years: 4.00    Pack years: 8.00    Types: Cigarettes    Last attempt to quit: 05/29/2003    Years since quitting: 15.0  . Smokeless tobacco: Current User    Types: Chew  Substance and Sexual Activity  . Alcohol use: No  . Drug use: No  . Sexual activity: Yes    Birth control/protection: Condom  Lifestyle  . Physical activity:    Days per week: Not on file    Minutes per session: Not on file  . Stress: Not on file  Relationships  . Social connections:    Talks on phone: Not on file    Gets together: Not on file    Attends religious service: Not on file    Active member of club or organization: Not on file    Attends meetings of clubs or organizations: Not on file    Relationship status: Not on file  Other Topics Concern  . Not on file  Social History  Narrative  . Not on file   Outpatient Encounter Medications as of 06/10/2018  Medication Sig  . Continuous Blood Gluc Receiver (FREESTYLE LIBRE 14 DAY READER) DEVI 1 each by Does not apply route once for 1 dose.  . Continuous Blood Gluc Sensor (FREESTYLE LIBRE 14 DAY SENSOR) MISC Inject 1 each into the skin every 14 (fourteen) days. Use as directed.  . gabapentin (NEURONTIN) 300 MG capsule Take 300 mg by mouth 2 (two) times daily.  Marland Kitchen. glucose blood (ONETOUCH VERIO) test strip Use as instructed  . ibuprofen (ADVIL,MOTRIN) 200 MG tablet Take 200 mg by mouth every 6 (six) hours as needed (pain).  . Insulin Aspart (NOVOLOG [place]) Inject 6-9 Units into the skin 3 (three) times daily before meals.  . Insulin Degludec (TRESIBA [place]) Inject 24 Units into the skin at bedtime.  .Marland Kitchen  levothyroxine (SYNTHROID, LEVOTHROID) 88 MCG tablet Take 1 tablet (88 mcg total) by mouth daily.  . [DISCONTINUED] naproxen (NAPROSYN) 250 MG tablet Take 1 tablet (250 mg total) by mouth 2 (two) times daily as needed for mild pain or moderate pain (take with food).   No facility-administered encounter medications on file as of 06/10/2018.     ALLERGIES: Allergies  Allergen Reactions  . Tomato Hives, Itching and Rash  . Penicillins Other (See Comments)    UNKNOWN REACTION    VACCINATION STATUS:  There is no immunization history on file for this patient.  Diabetes  He presents for his follow-up diabetic visit. He has type 1 diabetes mellitus. Onset time: He was diagnosed at approximate age of 8 years. His disease course has been fluctuating (He has had 2 prior episodes of diabetes ketoacidosis.  His most recent 4 readings of A1c's are greater than 10%.). Hypoglycemia symptoms include hunger and sweats. Pertinent negatives for hypoglycemia include no confusion, dizziness, headaches, nervousness/anxiousness, pallor or seizures. Associated symptoms include polydipsia and polyuria. Pertinent negatives for diabetes include no chest  pain, no fatigue, no polyphagia and no weakness. There are no hypoglycemic complications. Symptoms are worsening. Diabetic complications include peripheral neuropathy. Risk factors for coronary artery disease include diabetes mellitus, tobacco exposure, sedentary lifestyle, family history and male sex. Current diabetic treatment includes insulin injections (He is currently on Tresiba 35 units every morning and NovoLog sliding scale.). His weight is stable. He is following a generally unhealthy diet. When asked about meal planning, he reported none. He has not had a previous visit with a dietitian. He rarely participates in exercise. His home blood glucose trend is fluctuating dramatically. His breakfast blood glucose range is generally >200 mg/dl. His lunch blood glucose range is generally >200 mg/dl. His dinner blood glucose range is generally >200 mg/dl. His bedtime blood glucose range is generally >200 mg/dl. His overall blood glucose range is >200 mg/dl. (He brings in his meter and logs showing significant fluctuation in his blood glucose profile.  No significant hypoglycemia since last visit. His recent A1c was 10.4% on March 13, 2018.) An ACE inhibitor/angiotensin II receptor blocker is not being taken. Eye exam is current.  Thyroid Problem  Presents for initial visit. Patient reports no anxiety, cold intolerance, constipation, diarrhea, fatigue, heat intolerance or palpitations. The symptoms have been stable. Past treatments include levothyroxine (Is currently on levothyroxine 75 mcg p.o. nightly.). His past medical history is significant for diabetes. Risk factors include family history of hypothyroidism.      Review of Systems  Constitutional: Negative for chills, fatigue, fever and unexpected weight change.  HENT: Negative for dental problem, mouth sores and trouble swallowing.   Eyes: Negative for visual disturbance.  Respiratory: Negative for cough, choking, chest tightness, shortness of  breath and wheezing.   Cardiovascular: Negative for chest pain, palpitations and leg swelling.  Gastrointestinal: Positive for abdominal pain and vomiting. Negative for abdominal distention, constipation, diarrhea and nausea.       He complains of intermittent abdominal pain, nausea/vomiting.  Endocrine: Positive for polydipsia and polyuria. Negative for cold intolerance, heat intolerance and polyphagia.  Genitourinary: Negative for dysuria, flank pain, hematuria and urgency.  Musculoskeletal: Negative for back pain, gait problem, myalgias and neck pain.  Skin: Negative for pallor, rash and wound.  Neurological: Negative for dizziness, seizures, syncope, weakness, numbness and headaches.  Psychiatric/Behavioral: Negative for confusion and dysphoric mood. The patient is not nervous/anxious.     Objective:    BP 101/71  Pulse 81   Ht 5\' 5"  (1.651 m)   Wt 126 lb (57.2 kg)   BMI 20.97 kg/m   Wt Readings from Last 3 Encounters:  06/10/18 126 lb (57.2 kg)  05/19/18 126 lb 6.4 oz (57.3 kg)  01/24/17 124 lb (56.2 kg)     Physical Exam Constitutional:      General: He is not in acute distress.    Appearance: He is well-developed.     Comments: Poor personal hygiene.  Walks with a cane.  HENT:     Head: Normocephalic and atraumatic.  Neck:     Musculoskeletal: Normal range of motion and neck supple.     Thyroid: No thyromegaly.     Trachea: No tracheal deviation.  Cardiovascular:     Pulses:          Dorsalis pedis pulses are 1+ on the right side and 1+ on the left side.       Posterior tibial pulses are 1+ on the right side and 1+ on the left side.     Heart sounds: S1 normal and S2 normal. No murmur. No gallop.   Pulmonary:     Effort: No respiratory distress.     Breath sounds: No wheezing.  Abdominal:     General: There is no distension.     Tenderness: There is no abdominal tenderness. There is no guarding.  Musculoskeletal:        General: No swelling.     Right  shoulder: He exhibits no swelling and no deformity.  Skin:    General: Skin is warm and dry.     Findings: No rash.     Nails: There is no clubbing.      Comments: Extensive tattoos.  Neurological:     Mental Status: He is alert and oriented to person, place, and time.     Cranial Nerves: No cranial nerve deficit.     Sensory: No sensory deficit.     Gait: Gait normal.     Deep Tendon Reflexes: Reflexes are normal and symmetric.  Psychiatric:        Speech: Speech normal.        Behavior: Behavior normal. Behavior is cooperative.       CMP ( most recent) CMP     Component Value Date/Time   NA 132 (L) 08/11/2015 1219   K 4.0 08/11/2015 1219   CL 94 (L) 08/11/2015 1219   CO2 28 08/11/2015 1219   GLUCOSE 378 (H) 08/11/2015 1219   BUN 12 08/11/2015 1219   CREATININE 0.86 08/11/2015 1219   CALCIUM 8.9 08/11/2015 1219   PROT 6.8 03/11/2013 0129   ALBUMIN 3.7 03/11/2013 0129   AST 31 03/11/2013 0129   ALT 24 03/11/2013 0129   ALKPHOS 97 03/11/2013 0129   BILITOT 0.4 03/11/2013 0129   GFRNONAA >90 03/11/2013 0129   GFRAA >90 03/11/2013 0129     Diabetic Labs (most recent): Lab Results  Component Value Date   HGBA1C 10.4 03/07/2018   HGBA1C 10.3 04/10/2017   HGBA1C 10.7 (H) 08/11/2015      Lab Results  Component Value Date   TSH 134.12 (H) 08/11/2015   TSH 32.191 (H) 10/30/2011   FREET4 0.45 (L) 11/01/2011      Assessment & Plan:   1. Uncontrolled type 1 diabetes mellitus with hyperglycemia (HCC) - Kevin Cooper. has currently uncontrolled symptomatic type 1 DM since 38 years of age.  -He returns with a log showing no documented or  reported hypoglycemia.  He still has significantly above target glycemic profile both fasting and postprandial.  His most recent A1c was 10.4%.  Recent labs reviewed. - I had a long discussion with him about the progressive nature of diabetes and the pathology behind its complications. -his diabetes is complicated by peripheral  neuropathy, tobacco use/abuse and he remains at a high risk for more acute and chronic complications which include CAD, CVA, CKD, retinopathy, and neuropathy. These are all discussed in detail with him.  - I have counseled him on diet management by adopting a carbohydrate restricted/protein rich diet.  -  Suggestion is made for him to avoid simple carbohydrates  from his diet including Cakes, Sweet Desserts / Pastries, Ice Cream, Soda (diet and regular), Sweet Tea, Candies, Chips, Cookies, Store Bought Juices, Alcohol in Excess of  1-2 drinks a day, Artificial Sweeteners, and "Sugar-free" Products. This will help patient to have stable blood glucose profile and potentially avoid unintended weight gain.   - I encouraged him to switch to  unprocessed or minimally processed complex starch and increased protein intake (animal or plant source), fruits, and vegetables.  - he is advised to stick to a routine mealtimes to eat 3 meals  a day and avoid unnecessary snacks ( to snack only to correct hypoglycemia).   - he  Has been scheduled with Norm Salt, RDN, CDE for individualized diabetes education.  - I have approached him with the following individualized plan to manage diabetes and patient agrees:   -He will continue to require intensive treatment with basal/bolus insulin in order for him to achieve and maintain control of diabetes to target. -He is approached for better engagement for strict monitoring of blood glucose 4 times a day-daily before meals and at bedtime. -He is advised to increase his [place] to 24 units nightly, readjust his prandial insulin NovoLog to 6 units 3 times a day with meals  for pre-meal BG readings of 70-150mg /dl, plus patient specific correction dose for unexpected hyperglycemia above 150mg /dl, associated with strict monitoring of glucose 4 times a day-before meals and at bedtime. - he is warned not to take insulin without proper monitoring per orders. - Adjustment  parameters are given to him for hypo and hyperglycemia in writing. - he is encouraged to call clinic for blood glucose levels less than 70 or above 300 mg /dl. -He is not a candidate for non-insulin medications for diabetes management. -I discussed and prescribed the libre CGM device for him.    - Patient specific target  A1c;  LDL, HDL, Triglycerides, and  Waist Circumference were discussed in detail.  2) Blood Pressure /Hypertension: His blood pressure is controlled to target.  He is not on any antihypertensive medications.    3) Lipids/Hyperlipidemia: No recent lipid panel to review.  He is not on statins.  He will be considered for fasting lipid panel on subsequent visits.    4)  Weight/Diet:  Body mass index is 20.97 kg/m.  He is not a candidate for weight loss.  CDE Consult will be initiated . Exercise, and detailed carbohydrates information provided  -  detailed on discharge instructions.  5) hypothyroidism -The circumstance of his diagnosis with hypothyroidism and not available to review.  He is currently on levothyroxine 88 mcg p.o. nightly.  - We discussed about correct intake of levothyroxine, at fasting, with water, separated by at least 30 minutes from breakfast, and separated by more than 4 hours from calcium, iron, multivitamins, acid reflux medications (PPIs). -Patient  is made aware of the fact that thyroid hormone replacement is needed for life, dose to be adjusted by periodic monitoring of thyroid function tests.  6) Chronic Care/Health Maintenance:  -he  Is not on ACEI/ARB and Statin medications and  is encouraged to initiate and continue to follow up with Ophthalmology, Dentist,  Podiatrist at least yearly or according to recommendations, and advised to quit tobacco use/abuse. I have recommended yearly flu vaccine and pneumonia vaccine at least every 5 years; moderate intensity exercise for up to 150 minutes weekly; and  sleep for at least 7 hours a day. Given his abdominal  complaints which may indicate gastroparesis, he may benefit from GI evaluation. - he is  advised to maintain close follow up with Kirstie Peri, MD for primary care needs, as well as his other providers for optimal and coordinated care.  - Time spent with the patient: 25 min, of which >50% was spent in reviewing his blood glucose logs , discussing his hypo- and hyper-glycemic episodes, reviewing his current and  previous labs and insulin doses and developing a plan to avoid hypo- and hyper-glycemia. Please refer to Patient Instructions for Blood Glucose Monitoring and Insulin/Medications Dosing Guide"  in media tab for additional information. Kevin Cooper. participated in the discussions, expressed understanding, and voiced agreement with the above plans.  All questions were answered to his satisfaction. he is encouraged to contact clinic should he have any questions or concerns prior to his return visit.   Follow up plan: - Return in about 5 weeks (around 07/15/2018) for Follow up with Pre-visit Labs, Meter, and Logs.  Marquis Lunch, MD [facility] [address] [place], [place] [address] Phone: [phone]  Fax: [phone]    06/10/2018, 1:01 PM  This note was partially dictated with voice recognition software. Similar sounding words can be transcribed inadequately or may not  be corrected upon review.

## 2018-07-15 ENCOUNTER — Ambulatory Visit: Payer: BLUE CROSS/BLUE SHIELD | Admitting: "Endocrinology

## 2018-07-16 LAB — MICROALBUMIN / CREATININE URINE RATIO
Creatinine, Urine: 106 mg/dL (ref 20–320)
Microalb Creat Ratio: 3 mcg/mg creat (ref <30)
Microalb, Ur: 0.3 mg/dL

## 2018-07-16 LAB — HEMOGLOBIN A1C
Hgb A1c MFr Bld: 9.4 % of total Hgb — ABNORMAL HIGH (ref <5.7)
Mean Plasma Glucose: 223 (calc)
eAG (mmol/L): 12.4 (calc)

## 2018-07-16 LAB — LIPID PANEL
Cholesterol: 145 mg/dL (ref <200)
HDL: 62 mg/dL (ref >=40)
LDL Cholesterol (Calc): 63 mg/dL (calc)
NON-HDL CHOLESTEROL (CALC): 83 mg/dL (ref <130)
TRIGLYCERIDES: 116 mg/dL (ref <150)
Total CHOL/HDL Ratio: 2.3 (calc) (ref <5.0)

## 2018-07-16 LAB — COMPLETE METABOLIC PANEL WITH GFR
AG RATIO: 1.8 (calc) (ref 1.0–2.5)
ALT: 13 U/L (ref 9–46)
AST: 18 U/L (ref 10–40)
Albumin: 4.9 g/dL (ref 3.6–5.1)
Alkaline phosphatase (APISO): 148 U/L — ABNORMAL HIGH (ref 36–130)
BUN: 12 mg/dL (ref 7–25)
CALCIUM: 9.7 mg/dL (ref 8.6–10.3)
CO2: 29 mmol/L (ref 20–32)
Chloride: 96 mmol/L — ABNORMAL LOW (ref 98–110)
Creat: 0.87 mg/dL (ref 0.60–1.35)
GFR, EST AFRICAN AMERICAN: 127 mL/min/{1.73_m2} (ref >=60)
GFR, EST NON AFRICAN AMERICAN: 109 mL/min/{1.73_m2} (ref >=60)
GLOBULIN: 2.8 g/dL (ref 1.9–3.7)
Glucose, Bld: 266 mg/dL — ABNORMAL HIGH (ref 65–99)
POTASSIUM: 4.3 mmol/L (ref 3.5–5.3)
SODIUM: 134 mmol/L — AB (ref 135–146)
TOTAL PROTEIN: 7.7 g/dL (ref 6.1–8.1)
Total Bilirubin: 0.5 mg/dL (ref 0.2–1.2)

## 2018-07-16 LAB — VITAMIN D 25 HYDROXY (VIT D DEFICIENCY, FRACTURES): Vit D, 25-Hydroxy: 30 ng/mL (ref 30–100)

## 2018-07-16 LAB — TSH: TSH: 14.02 m[IU]/L — AB (ref 0.40–4.50)

## 2018-07-16 LAB — T4, FREE: Free T4: 1.3 ng/dL (ref 0.8–1.8)

## 2018-07-29 ENCOUNTER — Ambulatory Visit (INDEPENDENT_AMBULATORY_CARE_PROVIDER_SITE_OTHER): Payer: BLUE CROSS/BLUE SHIELD | Admitting: "Endocrinology

## 2018-07-29 ENCOUNTER — Encounter: Payer: Self-pay | Admitting: "Endocrinology

## 2018-07-29 VITALS — BP 109/70 | HR 77 | Resp 12 | Ht 65.0 in | Wt 126.0 lb

## 2018-07-29 DIAGNOSIS — E1065 Type 1 diabetes mellitus with hyperglycemia: Secondary | ICD-10-CM | POA: Diagnosis not present

## 2018-07-29 DIAGNOSIS — E039 Hypothyroidism, unspecified: Secondary | ICD-10-CM | POA: Diagnosis not present

## 2018-07-29 MED ORDER — LEVOTHYROXINE SODIUM 100 MCG PO TABS: 100.0000 ug | ORAL_TABLET | Freq: Every day | ORAL | 3 refills | Status: DC

## 2018-07-29 NOTE — Progress Notes (Signed)
Endocrinology follow-up note       07/29/2018, 5:21 PM   Subjective:    Patient ID: Kevin Cooper., male    DOB: 1981/03/25.  Mustaf Foutch. is being seen in consultation for management of currently uncontrolled symptomatic type 1 diabetes, hypothyroidism. PMD:   Kirstie Peri, MD.   Past Medical History:  Diagnosis Date  . Chronic right hip pain   . Diabetes mellitus    diagnosed age 38  . Seizures (HCC)    last one january 2016 due to low bs  . Thyroid disease    History reviewed. No pertinent surgical history. Social History   Socioeconomic History  . Marital status: Legally Separated    Spouse name: Not on file  . Number of children: Not on file  . Years of education: Not on file  . Highest education level: Not on file  Occupational History  . Not on file  Social Needs  . Financial resource strain: Not on file  . Food insecurity:    Worry: Not on file    Inability: Not on file  . Transportation needs:    Medical: Not on file    Non-medical: Not on file  Tobacco Use  . Smoking status: Former Smoker    Packs/day: 2.00    Years: 4.00    Pack years: 8.00    Types: Cigarettes    Last attempt to quit: 05/29/2003    Years since quitting: 15.1  . Smokeless tobacco: Current User    Types: Chew  Substance and Sexual Activity  . Alcohol use: No  . Drug use: No  . Sexual activity: Yes    Birth control/protection: Condom  Lifestyle  . Physical activity:    Days per week: Not on file    Minutes per session: Not on file  . Stress: Not on file  Relationships  . Social connections:    Talks on phone: Not on file    Gets together: Not on file    Attends religious service: Not on file    Active member of club or organization: Not on file    Attends meetings of clubs or organizations: Not on file    Relationship status: Not on file  Other Topics Concern  . Not on file  Social History  Narrative  . Not on file   Outpatient Encounter Medications as of 07/29/2018  Medication Sig  . Continuous Blood Gluc Sensor (FREESTYLE LIBRE 14 DAY SENSOR) MISC Inject 1 each into the skin every 14 (fourteen) days. Use as directed.  . gabapentin (NEURONTIN) 300 MG capsule Take 300 mg by mouth 2 (two) times daily.  Marland Kitchen glucose blood (ONETOUCH VERIO) test strip Use as instructed  . ibuprofen (ADVIL,MOTRIN) 200 MG tablet Take 200 mg by mouth every 6 (six) hours as needed (pain).  . Insulin Aspart (NOVOLOG [place]) Inject 8-11 Units into the skin 3 (three) times daily before meals.  . Insulin Degludec (TRESIBA ) Inject 24 Units into the skin at bedtime.  Marland Kitchen levothyroxine (SYNTHROID, LEVOTHROID) 100 MCG tablet Take 1 tablet (100 mcg total) by mouth daily before breakfast.  . [DISCONTINUED] levothyroxine (  SYNTHROID, LEVOTHROID) 88 MCG tablet Take 1 tablet (88 mcg total) by mouth daily.   No facility-administered encounter medications on file as of 07/29/2018.     ALLERGIES: Allergies  Allergen Reactions  . Tomato Hives, Itching and Rash  . Penicillins Other (See Comments)    UNKNOWN REACTION    VACCINATION STATUS:  There is no immunization history on file for this patient.  Diabetes  He presents for his follow-up diabetic visit. He has type 1 diabetes mellitus. Onset time: He was diagnosed at approximate age of 8 years. His disease course has been improving (He has had 2 prior episodes of diabetes ketoacidosis.  His most recent 4 readings of A1c's are greater than 10%.). Hypoglycemia symptoms include hunger and sweats. Pertinent negatives for hypoglycemia include no confusion, dizziness, headaches, nervousness/anxiousness, pallor or seizures. Associated symptoms include polydipsia and polyuria. Pertinent negatives for diabetes include no chest pain, no fatigue, no polyphagia and no weakness. There are no hypoglycemic complications. Symptoms are improving. Diabetic complications include peripheral  neuropathy. Risk factors for coronary artery disease include diabetes mellitus, tobacco exposure, sedentary lifestyle, family history and male sex. Current diabetic treatment includes insulin injections (He is currently on Tresiba 35 units every morning and NovoLog sliding scale.). His weight is fluctuating minimally. He is following a generally unhealthy diet. When asked about meal planning, he reported none. He has not had a previous visit with a dietitian. He rarely participates in exercise. His home blood glucose trend is fluctuating dramatically. (He did not bring his logs with him, CGM device shows readings average of 203 (generally between 187-212).  His A1c is 9.4%, generally improving from 10.7%.) An ACE inhibitor/angiotensin II receptor blocker is not being taken. Eye exam is current.  Thyroid Problem  Presents for initial visit. Patient reports no anxiety, cold intolerance, constipation, diarrhea, fatigue, heat intolerance or palpitations. The symptoms have been stable. Past treatments include levothyroxine (Is currently on levothyroxine 75 mcg p.o. nightly.). His past medical history is significant for diabetes. Risk factors include family history of hypothyroidism.      Review of Systems  Constitutional: Negative for chills, fatigue, fever and unexpected weight change.  HENT: Negative for dental problem, mouth sores and trouble swallowing.   Eyes: Negative for visual disturbance.  Respiratory: Negative for cough, choking, chest tightness, shortness of breath and wheezing.   Cardiovascular: Negative for chest pain, palpitations and leg swelling.  Gastrointestinal: Negative for abdominal distention, abdominal pain, constipation, diarrhea, nausea and vomiting.  Endocrine: Positive for polydipsia and polyuria. Negative for cold intolerance, heat intolerance and polyphagia.  Genitourinary: Negative for dysuria, flank pain, hematuria and urgency.  Musculoskeletal: Negative for back pain, gait  problem, myalgias and neck pain.  Skin: Negative for pallor, rash and wound.  Neurological: Negative for dizziness, seizures, syncope, weakness, numbness and headaches.  Psychiatric/Behavioral: Negative for confusion and dysphoric mood. The patient is not nervous/anxious.     Objective:    BP 109/70   Pulse 77   Resp 12   Ht 5\' 5"  (1.651 m)   Wt 126 lb (57.2 kg)   SpO2 97%   BMI 20.97 kg/m   Wt Readings from Last 3 Encounters:  07/29/18 126 lb (57.2 kg)  06/10/18 126 lb (57.2 kg)  05/19/18 126 lb 6.4 oz (57.3 kg)     Physical Exam Constitutional:      General: He is not in acute distress.    Appearance: He is well-developed.     Comments: Poor personal hygiene.  Walks with  a cane.  HENT:     Head: Normocephalic and atraumatic.  Neck:     Musculoskeletal: Normal range of motion and neck supple.     Thyroid: No thyromegaly.     Trachea: No tracheal deviation.  Cardiovascular:     Pulses:          Dorsalis pedis pulses are 1+ on the right side and 1+ on the left side.       Posterior tibial pulses are 1+ on the right side and 1+ on the left side.     Heart sounds: S1 normal and S2 normal. No murmur. No gallop.   Pulmonary:     Effort: No respiratory distress.     Breath sounds: No wheezing.  Abdominal:     General: There is no distension.     Tenderness: There is no abdominal tenderness. There is no guarding.  Musculoskeletal:        General: No swelling.     Right shoulder: He exhibits no swelling and no deformity.  Skin:    General: Skin is warm and dry.     Findings: No rash.     Nails: There is no clubbing.      Comments: Extensive tattoos.  Neurological:     Mental Status: He is alert and oriented to person, place, and time.     Cranial Nerves: No cranial nerve deficit.     Sensory: No sensory deficit.     Gait: Gait normal.     Deep Tendon Reflexes: Reflexes are normal and symmetric.  Psychiatric:        Speech: Speech normal.        Behavior: Behavior  normal. Behavior is cooperative.     CMP ( most recent) CMP     Component Value Date/Time   NA 134 (L) 07/15/2018 1111   K 4.3 07/15/2018 1111   CL 96 (L) 07/15/2018 1111   CO2 29 07/15/2018 1111   GLUCOSE 266 (H) 07/15/2018 1111   BUN 12 07/15/2018 1111   CREATININE 0.87 07/15/2018 1111   CALCIUM 9.7 07/15/2018 1111   PROT 7.7 07/15/2018 1111   ALBUMIN 3.7 03/11/2013 0129   AST 18 07/15/2018 1111   ALT 13 07/15/2018 1111   ALKPHOS 97 03/11/2013 0129   BILITOT 0.5 07/15/2018 1111   GFRNONAA 109 07/15/2018 1111   GFRAA 127 07/15/2018 1111     Diabetic Labs (most recent): Lab Results  Component Value Date   HGBA1C 9.4 (H) 07/15/2018   HGBA1C 10.4 03/07/2018   HGBA1C 10.3 04/10/2017      Lab Results  Component Value Date   TSH 14.02 (H) 07/15/2018   TSH 134.12 (H) 08/11/2015   TSH 32.191 (H) 10/30/2011   FREET4 1.3 07/15/2018   FREET4 0.45 (L) 11/01/2011    Lipid Panel     Component Value Date/Time   CHOL 145 07/15/2018 1111   TRIG 116 07/15/2018 1111   HDL 62 07/15/2018 1111   CHOLHDL 2.3 07/15/2018 1111   LDLCALC 63 07/15/2018 1111      Assessment & Plan:   1. Uncontrolled type 1 diabetes mellitus with hyperglycemia (HCC) - [name]. has currently uncontrolled symptomatic type 1 DM since 38 years of age.  -He returns without his logs nor insulin administration records.  This makes it difficult to optimize his insulin regimen.  His A1c is slightly improving at 9.4% from 10.7%.    Recent labs reviewed with him.  -his diabetes is complicated by peripheral  neuropathy, tobacco use/abuse and he remains at a high risk for more acute and chronic complications which include CAD, CVA, CKD, retinopathy, and neuropathy. These are all discussed in detail with him.  - I have counseled him on diet management by adopting a carbohydrate restricted/protein rich diet.  - Patient admits there is a room for improvement in his diet and drink choices. -  Suggestion is  made for him to avoid simple carbohydrates  from his diet including Cakes, Sweet Desserts / Pastries, Ice Cream, Soda (diet and regular), Sweet Tea, Candies, Chips, Cookies, Store Bought Juices, Alcohol in Excess of  1-2 drinks a day, Artificial Sweeteners, and "Sugar-free" Products. This will help patient to have stable blood glucose profile and potentially avoid unintended weight gain.   - I encouraged him to switch to  unprocessed or minimally processed complex starch and increased protein intake (animal or plant source), fruits, and vegetables.  - he is advised to stick to a routine mealtimes to eat 3 meals  a day and avoid unnecessary snacks ( to snack only to correct hypoglycemia).   - he  Has been scheduled with Norm Salt, RDN, CDE for individualized diabetes education.  - I have approached him with the following individualized plan to manage diabetes and patient agrees:   -He he is urged to present his insulin administration logs along with his meter/CGM in order for [place] to optimize his insulin regimen.   -In the meantime, he is advised to continue Tresiba 24 units nightly, increase NovoLog slightly to 6-9 units 3 times daily AC for pre-meal blood glucose readings of 90 mg/dL or above.    -He is advised to use his CGM device at all times.   - he is warned not to take insulin without proper monitoring per orders. - Adjustment parameters are given to him for hypo and hyperglycemia in writing. - he is encouraged to call clinic for blood glucose levels less than 70 or above 300 mg /dl. -He is not a candidate for non-insulin medications for diabetes management.  - Patient specific target  A1c;  LDL, HDL, Triglycerides, and  Waist Circumference were discussed in detail.  2) Blood Pressure /Hypertension: His blood pressure is controlled to target.    He is not on any antihypertensive medications.    3) Lipids/Hyperlipidemia: His recent lipid panel showed controlled LDL at 63.  He is not  on statins. He will be considered for fasting lipid panel on subsequent visits.    4)  Weight/Diet:  Body mass index is 20.97 kg/m.  He is not a candidate for weight loss.  CDE Consult will be initiated . Exercise, and detailed carbohydrates information provided  -  detailed on discharge instructions.  5) hypothyroidism -The circumstance of his diagnosis with hypothyroidism and not available to review.  His previsit thyroid function tests are consistent with under replacement.  His levothyroxine is increased to 100 mcg p.o. daily before breakfast.    - We discussed about the correct intake of his thyroid hormone, on empty stomach at fasting, with water, separated by at least 30 minutes from breakfast and other medications,  and separated by more than 4 hours from calcium, iron, multivitamins, acid reflux medications (PPIs). -Patient is made aware of the fact that thyroid hormone replacement is needed for life, dose to be adjusted by periodic monitoring of thyroid function tests.   6) Chronic Care/Health Maintenance:  -he  is not on ACEI/ARB and Statin medications and  is encouraged to initiate  and continue to follow up with Ophthalmology, Dentist,  Podiatrist at least yearly or according to recommendations, and advised to quit tobacco use/abuse. I have recommended yearly flu vaccine and pneumonia vaccine at least every 5 years; moderate intensity exercise for up to 150 minutes weekly; and  sleep for at least 7 hours a day.  - he is  advised to maintain close follow up with Kirstie Peri, MD for primary care needs, as well as his other providers for optimal and coordinated care. - Time spent with the patient: 25 min, of which >50% was spent in reviewing his blood glucose logs , discussing his hypoglycemia and hyperglycemia episodes, reviewing his current and  previous labs / studies and medications  doses and developing a plan to avoid hypoglycemia and hyperglycemia. Please refer to Patient  Instructions for Blood Glucose Monitoring and Insulin/Medications Dosing Guide"  in media tab for additional information. [name]. participated in the discussions, expressed understanding, and voiced agreement with the above plans.  All questions were answered to his satisfaction. he is encouraged to contact clinic should he have any questions or concerns prior to his return visit.  Follow up plan: - Return in about 3 months (around 10/29/2018) for Follow up with Pre-visit Labs, Meter, and Logs.  Marquis Lunch, MD [facility] [address] [place], [place] [address] Phone: [phone]  Fax: [phone]    07/29/2018, 5:21 PM  This note was partially dictated with voice recognition software. Similar sounding words can be transcribed inadequately or may not  be corrected upon review.

## 2018-09-01 ENCOUNTER — Other Ambulatory Visit: Payer: Self-pay

## 2018-09-01 MED ORDER — INSULIN ASPART 100 UNIT/ML ~~LOC~~ SOLN: 8.0000 [IU] | Freq: Three times a day (TID) | SUBCUTANEOUS | 0 refills | Status: DC

## 2018-09-01 MED ORDER — LEVOTHYROXINE SODIUM 100 MCG PO TABS: 100.0000 ug | ORAL_TABLET | Freq: Every day | ORAL | 0 refills | Status: DC

## 2018-09-01 MED ORDER — FREESTYLE LIBRE 14 DAY SENSOR MISC: 1.0000 | 2 refills | Status: DC

## 2018-09-01 MED ORDER — INSULIN DEGLUDEC 100 UNIT/ML ~~LOC~~ SOLN: 24.0000 [IU] | Freq: Every day | SUBCUTANEOUS | 0 refills | Status: DC

## 2018-10-08 ENCOUNTER — Other Ambulatory Visit: Payer: Self-pay | Admitting: "Endocrinology

## 2018-10-09 ENCOUNTER — Other Ambulatory Visit: Payer: Self-pay | Admitting: "Endocrinology

## 2018-10-29 ENCOUNTER — Ambulatory Visit (INDEPENDENT_AMBULATORY_CARE_PROVIDER_SITE_OTHER): Payer: BC Managed Care – PPO | Admitting: "Endocrinology

## 2018-10-29 ENCOUNTER — Encounter: Payer: Self-pay | Admitting: "Endocrinology

## 2018-10-29 ENCOUNTER — Other Ambulatory Visit: Payer: Self-pay

## 2018-10-29 DIAGNOSIS — E039 Hypothyroidism, unspecified: Secondary | ICD-10-CM | POA: Diagnosis not present

## 2018-10-29 DIAGNOSIS — E1065 Type 1 diabetes mellitus with hyperglycemia: Secondary | ICD-10-CM

## 2018-10-29 MED ORDER — INSULIN ASPART 100 UNIT/ML ~~LOC~~ SOLN: 8.0000 [IU] | Freq: Three times a day (TID) | SUBCUTANEOUS | 0 refills | Status: DC

## 2018-10-29 NOTE — Progress Notes (Signed)
Endocrinology follow-up note       10/29/2018, 4:47 PM   Subjective:    Patient ID: Kevin Quinney., male    DOB: 11/21/1980.  Kevin Mcshan. is being seen in consultation for management of currently uncontrolled symptomatic type 1 diabetes, hypothyroidism. PMD:   Kirstie Peri, MD.   Past Medical History:  Diagnosis Date  . Chronic right hip pain   . Diabetes mellitus    diagnosed age 38  . Seizures (HCC)    last one january 2016 due to low bs  . Thyroid disease    History reviewed. No pertinent surgical history. Social History   Socioeconomic History  . Marital status: Legally Separated    Spouse name: Not on file  . Number of children: Not on file  . Years of education: Not on file  . Highest education level: Not on file  Occupational History  . Not on file  Social Needs  . Financial resource strain: Not on file  . Food insecurity:    Worry: Not on file    Inability: Not on file  . Transportation needs:    Medical: Not on file    Non-medical: Not on file  Tobacco Use  . Smoking status: Former Smoker    Packs/day: 2.00    Years: 4.00    Pack years: 8.00    Types: Cigarettes    Last attempt to quit: 05/29/2003    Years since quitting: 15.4  . Smokeless tobacco: Current User    Types: Chew  Substance and Sexual Activity  . Alcohol use: No  . Drug use: No  . Sexual activity: Yes    Birth control/protection: Condom  Lifestyle  . Physical activity:    Days per week: Not on file    Minutes per session: Not on file  . Stress: Not on file  Relationships  . Social connections:    Talks on phone: Not on file    Gets together: Not on file    Attends religious service: Not on file    Active member of club or organization: Not on file    Attends meetings of clubs or organizations: Not on file    Relationship status: Not on file  Other Topics Concern  . Not on file  Social History  Narrative  . Not on file   Outpatient Encounter Medications as of 10/29/2018  Medication Sig  . Continuous Blood Gluc Sensor (FREESTYLE LIBRE 14 DAY SENSOR) MISC Inject 1 each into the skin every 14 (fourteen) days. Use as directed.  . gabapentin (NEURONTIN) 300 MG capsule Take 300 mg by mouth 2 (two) times daily.  Marland Kitchen glucose blood (ONETOUCH VERIO) test strip Use as instructed  . ibuprofen (ADVIL,MOTRIN) 200 MG tablet Take 200 mg by mouth every 6 (six) hours as needed (pain).  . insulin aspart (NOVOLOG) 100 UNIT/ML injection Inject 8-11 Units into the skin 3 (three) times daily before meals.  . Insulin Degludec (TRESIBA) 100 UNIT/ML SOLN Inject 24 Units into the skin at bedtime.  Marland Kitchen levothyroxine (SYNTHROID) 100 MCG tablet TAKE (1) TABLET DAILY BEFORE BREAKFAST.  . [DISCONTINUED] insulin aspart (NOVOLOG)  100 UNIT/ML injection Inject 8-11 Units into the skin 3 (three) times daily before meals.   No facility-administered encounter medications on file as of 10/29/2018.     ALLERGIES: Allergies  Allergen Reactions  . Tomato Hives, Itching and Rash  . Penicillins Other (See Comments)    UNKNOWN REACTION    VACCINATION STATUS:  There is no immunization history on file for this patient.  Diabetes  He presents for his follow-up diabetic visit. He has type 1 diabetes mellitus. Onset time: He was diagnosed at approximate age of 8 years. His disease course has been worsening (He has had 2 prior episodes of diabetes ketoacidosis.  His most recent 4 readings of A1c's are greater than 10%.). Hypoglycemia symptoms include hunger and sweats. Pertinent negatives for hypoglycemia include no confusion, dizziness, headaches, nervousness/anxiousness, pallor or seizures. Associated symptoms include polydipsia and polyuria. Pertinent negatives for diabetes include no chest pain, no fatigue, no polyphagia and no weakness. There are no hypoglycemic complications. Symptoms are improving. Diabetic complications include  peripheral neuropathy. Risk factors for coronary artery disease include diabetes mellitus, tobacco exposure, sedentary lifestyle, family history and male sex. Current diabetic treatment includes insulin injections (He is currently on Tresiba 35 units every morning and NovoLog sliding scale.). He is following a generally unhealthy diet. When asked about meal planning, he reported none. He has not had a previous visit with a dietitian. He rarely participates in exercise. His home blood glucose trend is fluctuating dramatically. His breakfast blood glucose range is generally >200 mg/dl. His lunch blood glucose range is generally >200 mg/dl. His dinner blood glucose range is generally >200 mg/dl. His bedtime blood glucose range is generally >200 mg/dl. His overall blood glucose range is >200 mg/dl. (Patient still reports significant fluctuation in his glycemic profile from hypoglycemia of 30s and 40s to severe hyperglycemia of 600s and 1 single day.) An ACE inhibitor/angiotensin II receptor blocker is not being taken. Eye exam is current.  Thyroid Problem  Presents for initial visit. Patient reports no anxiety, cold intolerance, constipation, diarrhea, fatigue, heat intolerance or palpitations. The symptoms have been stable. Past treatments include levothyroxine (Is currently on levothyroxine 75 mcg p.o. nightly.). His past medical history is significant for diabetes. Risk factors include family history of hypothyroidism.      Review of Systems  Constitutional: Negative for chills, fatigue, fever and unexpected weight change.  HENT: Negative for dental problem, mouth sores and trouble swallowing.   Eyes: Negative for visual disturbance.  Respiratory: Negative for cough, choking, chest tightness, shortness of breath and wheezing.   Cardiovascular: Negative for chest pain, palpitations and leg swelling.  Gastrointestinal: Negative for abdominal distention, abdominal pain, constipation, diarrhea, nausea and  vomiting.  Endocrine: Positive for polydipsia and polyuria. Negative for cold intolerance, heat intolerance and polyphagia.  Genitourinary: Negative for dysuria, flank pain, hematuria and urgency.  Musculoskeletal: Negative for back pain, gait problem, myalgias and neck pain.  Skin: Negative for pallor, rash and wound.  Neurological: Negative for dizziness, seizures, syncope, weakness, numbness and headaches.  Psychiatric/Behavioral: Negative for confusion and dysphoric mood. The patient is not nervous/anxious.     Objective:    There were no vitals taken for this visit.  Wt Readings from Last 3 Encounters:  07/29/18 126 lb (57.2 kg)  06/10/18 126 lb (57.2 kg)  05/19/18 126 lb 6.4 oz (57.3 kg)      CMP ( most recent) CMP     Component Value Date/Time   NA 134 (L) 07/15/2018 1111  K 4.3 07/15/2018 1111   CL 96 (L) 07/15/2018 1111   CO2 29 07/15/2018 1111   GLUCOSE 266 (H) 07/15/2018 1111   BUN 12 07/15/2018 1111   CREATININE 0.87 07/15/2018 1111   CALCIUM 9.7 07/15/2018 1111   PROT 7.7 07/15/2018 1111   ALBUMIN 3.7 03/11/2013 0129   AST 18 07/15/2018 1111   ALT 13 07/15/2018 1111   ALKPHOS 97 03/11/2013 0129   BILITOT 0.5 07/15/2018 1111   GFRNONAA 109 07/15/2018 1111   GFRAA 127 07/15/2018 1111    Diabetic Labs (most recent): Lab Results  Component Value Date   HGBA1C 9.4 (H) 07/15/2018   HGBA1C 10.4 03/07/2018   HGBA1C 10.3 04/10/2017     Lab Results  Component Value Date   TSH 14.02 (H) 07/15/2018   TSH 134.12 (H) 08/11/2015   TSH 32.191 (H) 10/30/2011   FREET4 1.3 07/15/2018   FREET4 0.45 (L) 11/01/2011    Lipid Panel     Component Value Date/Time   CHOL 145 07/15/2018 1111   TRIG 116 07/15/2018 1111   HDL 62 07/15/2018 1111   CHOLHDL 2.3 07/15/2018 1111   LDLCALC 63 07/15/2018 1111    Assessment & Plan:   1. Uncontrolled type 1 diabetes mellitus with hyperglycemia (HCC) - Kevin Cooper. has currently uncontrolled symptomatic type 1 DM since  38 years of age.  -He reports severely fluctuating glycemic profile from hypoglycemia to hyperglycemia any particular day due to discoordinated meal/insulin timing.  He did not go for his previsit labs due to the coronavirus concern.    -his diabetes is complicated by peripheral neuropathy, tobacco use/abuse and he remains at a high risk for more acute and chronic complications which include CAD, CVA, CKD, retinopathy, and neuropathy. These are all discussed in detail with him.  - I have counseled him on diet management by adopting a carbohydrate restricted/protein rich diet.  - Patient admits there is a room for improvement in his diet and drink choices. -  Suggestion is made for him to avoid simple carbohydrates  from his diet including Cakes, Sweet Desserts / Pastries, Ice Cream, Soda (diet and regular), Sweet Tea, Candies, Chips, Cookies, Store Bought Juices, Alcohol in Excess of  1-2 drinks a day, Artificial Sweeteners, and "Sugar-free" Products. This will help patient to have stable blood glucose profile and potentially avoid unintended weight gain.   - I encouraged him to switch to  unprocessed or minimally processed complex starch and increased protein intake (animal or plant source), fruits, and vegetables.  - he is advised to stick to a routine mealtimes to eat 3 meals  a day and avoid unnecessary snacks ( to snack only to correct hypoglycemia).   - he  Has been scheduled with Norm Salt, RDN, CDE for individualized diabetes education.  - I have approached him with the following individualized plan to manage diabetes and patient agrees:   -Continue to require intensive treatment with basal/bolus insulin in order for him to achieve and maintain control of diabetes to target.  For this to happen, he needs to engage optimally for proper monitoring of blood glucose. -He  is advised to continue Tresiba 24 units nightly, increase NovoLog slightly to 8-11 units 3 times daily AC for  pre-meal blood glucose readings of 90 mg/dL or above.    -He is advised to use his CGM device at all times.   - he is warned not to take insulin without proper monitoring per orders. - Adjustment parameters are given to  him for hypo and hyperglycemia in writing. - he is encouraged to call clinic for blood glucose levels less than 70 or above 300 mg /dl. -He is not a candidate for non-insulin medications for diabetes management.  - Patient specific target  A1c;  LDL, HDL, Triglycerides, and  Waist Circumference were discussed in detail.  2) Blood Pressure /Hypertension: His blood pressure is controlled to target.    He is not on any antihypertensive medications.    3) Lipids/Hyperlipidemia: His recent lipid panel showed controlled LDL at 63.  He is not on statins. He will be considered for fasting lipid panel on subsequent visits.    4)  Weight/Diet:  There is no height or weight on file to calculate BMI.  He is not a candidate for weight loss.  CDE Consult will be initiated . Exercise, and detailed carbohydrates information provided  -  detailed on discharge instructions.  5) hypothyroidism -He is advised to continue levothyroxine 100 mcg p.o. before breakfast.  - We discussed about the correct intake of his thyroid hormone, on empty stomach at fasting, with water, separated by at least 30 minutes from breakfast and other medications,  and separated by more than 4 hours from calcium, iron, multivitamins, acid reflux medications (PPIs). -Patient is made aware of the fact that thyroid hormone replacement is needed for life, dose to be adjusted by periodic monitoring of thyroid function tests.   6) Chronic Care/Health Maintenance:  -he  is not on ACEI/ARB and Statin medications and  is encouraged to initiate and continue to follow up with Ophthalmology, Dentist,  Podiatrist at least yearly or according to recommendations, and advised to quit tobacco use/abuse. I have recommended yearly flu  vaccine and pneumonia vaccine at least every 5 years; moderate intensity exercise for up to 150 minutes weekly; and  sleep for at least 7 hours a day.  - he is  advised to maintain close follow up with Kirstie PeriShah, Ashish, MD for primary care needs, as well as his other providers for optimal and coordinated care.  - Patient Care Time Today:  25 min, of which >50% was spent in reviewing his  current and  previous labs/studies, previous treatments, and medications doses and developing a plan for long-term care based on the latest recommendations for standards of care.  Kevin CanalesJerry Bergthold Jr. participated in the discussions, expressed understanding, and voiced agreement with the above plans.  All questions were answered to his satisfaction. he is encouraged to contact clinic should he have any questions or concerns prior to his return visit.   Follow up plan: - Return in about 3 months (around 01/29/2019) for Follow up with Pre-visit Labs, Meter, and Logs.  Kevin LunchGebre Gelene Recktenwald, MD [facility] [address] [place], [place] [address] Phone: [phone]  Fax: [phone]    10/29/2018, 4:47 PM  This note was partially dictated with voice recognition software. Similar sounding words can be transcribed inadequately or may not  be corrected upon review.

## 2018-11-06 ENCOUNTER — Other Ambulatory Visit: Payer: Self-pay | Admitting: "Endocrinology

## 2018-11-19 ENCOUNTER — Other Ambulatory Visit: Payer: Self-pay | Admitting: "Endocrinology

## 2018-12-09 ENCOUNTER — Telehealth: Payer: Self-pay | Admitting: "Endocrinology

## 2018-12-09 DIAGNOSIS — E1065 Type 1 diabetes mellitus with hyperglycemia: Secondary | ICD-10-CM

## 2018-12-09 MED ORDER — PEN NEEDLES 31G X 8 MM MISC: 8.0000 [IU] | Freq: Three times a day (TID) | 2 refills | Status: DC

## 2018-12-09 NOTE — Telephone Encounter (Signed)
Patient left VM that his insurance will not longer pay for freestyle libre and they will pay for dexcom. He also needs a refill on his half cc insulin needles. [place]

## 2018-12-09 NOTE — Telephone Encounter (Signed)
Routing to Dr. Dorris Fetch regarding Dexcom

## 2018-12-10 ENCOUNTER — Other Ambulatory Visit: Payer: Self-pay | Admitting: "Endocrinology

## 2018-12-10 DIAGNOSIS — E1065 Type 1 diabetes mellitus with hyperglycemia: Secondary | ICD-10-CM

## 2018-12-10 MED ORDER — DEXCOM G6 SENSOR MISC: 4.0000 | 2 refills | Status: DC

## 2018-12-10 MED ORDER — DEXCOM G6 RECEIVER DEVI: 1.0000 | 0 refills | Status: DC | PRN

## 2018-12-10 NOTE — Telephone Encounter (Signed)
Done

## 2019-01-29 ENCOUNTER — Ambulatory Visit: Payer: BC Managed Care – PPO | Admitting: "Endocrinology

## 2019-01-29 ENCOUNTER — Encounter: Payer: Self-pay | Admitting: "Endocrinology

## 2019-05-11 ENCOUNTER — Other Ambulatory Visit: Payer: Self-pay | Admitting: "Endocrinology

## 2019-07-24 ENCOUNTER — Other Ambulatory Visit: Payer: Self-pay

## 2019-07-28 ENCOUNTER — Ambulatory Visit: Payer: Self-pay | Admitting: Endocrinology

## 2019-12-10 ENCOUNTER — Encounter: Payer: Self-pay | Admitting: Physician Assistant

## 2019-12-10 ENCOUNTER — Ambulatory Visit: Payer: Medicaid Other | Admitting: Physician Assistant

## 2019-12-10 ENCOUNTER — Other Ambulatory Visit: Payer: Self-pay

## 2019-12-10 VITALS — BP 128/90 | HR 79 | Temp 98.1°F | Ht 64.25 in | Wt 123.5 lb

## 2019-12-10 DIAGNOSIS — F489 Nonpsychotic mental disorder, unspecified: Secondary | ICD-10-CM

## 2019-12-10 DIAGNOSIS — E039 Hypothyroidism, unspecified: Secondary | ICD-10-CM

## 2019-12-10 DIAGNOSIS — Z7689 Persons encountering health services in other specified circumstances: Secondary | ICD-10-CM

## 2019-12-10 DIAGNOSIS — Z862 Personal history of diseases of the blood and blood-forming organs and certain disorders involving the immune mechanism: Secondary | ICD-10-CM

## 2019-12-10 DIAGNOSIS — E1065 Type 1 diabetes mellitus with hyperglycemia: Secondary | ICD-10-CM

## 2019-12-10 NOTE — Progress Notes (Signed)
BP 128/90   Pulse 79   Temp 98.1 F (36.7 C)   SpO2 98%    Subjective:    Patient ID: Kevin Cooper., male    DOB: February 28, 1981, 39 y.o.   MRN: [id number]  HPI: Kevin Cooper. is a 39 y.o. male presenting on 12/10/2019 for New Patient (Initial Visit) (previous PCP was [facility] with Dr Clelia Croft and Dr. Lissa Hoard which pt saw about 2-3 months ago then pt lost his insurance)   HPI     Pt had a negative covid 19 screening questionnaire.     Pt is a 39yoM who has appointment today to establish care; he is a returning pt; he was last seen here 08/11/2015.    Pt was being followed by Dr Nida/endocrinology for his Type 1 DM and thyroid;  his last visit there was 10/29/18  He says lately his bs have "been all over the place" lately.  Pt has not gotten his covid vaccaination.  He says the prozac is working good for him.    Pt gets his G6 CGM through the manufacturer.  He says it's  $45 every 3 months; this is the dexcom G6 that he uses to monitor his bs.   He gets his meds through the state at the health dept         Relevant past medical, surgical, family and social history reviewed and updated as indicated. Interim medical history since our last visit reviewed. Allergies and medications reviewed and updated.   Current Outpatient Medications:  .  FLUoxetine HCl (PROZAC PO), Take 1 tablet by mouth daily., Disp: , Rfl:  .  gabapentin (NEURONTIN) 300 MG capsule, Take 300 mg by mouth 2 (two) times daily., Disp: , Rfl:  .  insulin aspart (NOVOLOG) 100 UNIT/ML injection, Inject 8-11 Units into the skin 3 (three) times daily before meals. (Patient taking differently: Inject 1-5 Units into the skin 3 (three) times daily before meals. ), Disp: 30 mL, Rfl: 0 .  Insulin Degludec (TRESIBA [place]), Inject 28 Units into the skin., Disp: , Rfl:  .  levothyroxine (SYNTHROID) 100 MCG tablet, TAKE (1) TABLET DAILY BEFORE BREAKFAST., Disp: 30 tablet, Rfl: 0    Review of Systems  Per HPI  unless specifically indicated above     Objective:    BP 128/90   Pulse 79   Temp 98.1 F (36.7 C)   SpO2 98%   Wt Readings from Last 3 Encounters:  07/29/18 126 lb (57.2 kg)  06/10/18 126 lb (57.2 kg)  05/19/18 126 lb 6.4 oz (57.3 kg)    Physical Exam Vitals reviewed.  Constitutional:      General: He is not in acute distress.    Appearance: He is well-developed.  HENT:     Head: Normocephalic and atraumatic.  Eyes:     Conjunctiva/sclera: Conjunctivae normal.     Pupils: Pupils are equal, round, and reactive to light.  Neck:     Thyroid: No thyromegaly.  Cardiovascular:     Rate and Rhythm: Normal rate and regular rhythm.  Pulmonary:     Effort: Pulmonary effort is normal.     Breath sounds: Normal breath sounds. No wheezing or rales.  Abdominal:     General: Bowel sounds are normal.     Palpations: Abdomen is soft. There is no mass.     Tenderness: There is no abdominal tenderness.  Musculoskeletal:     Cervical back: Neck supple.     Right lower  leg: No edema.     Left lower leg: No edema.  Lymphadenopathy:     Cervical: No cervical adenopathy.  Skin:    General: Skin is warm and dry.     Findings: No rash.  Neurological:     Mental Status: He is alert and oriented to person, place, and time.  Psychiatric:        Behavior: Behavior normal.            Assessment & Plan:     Encounter Diagnoses  Name Primary?  . Encounter to establish care Yes  . Uncontrolled type 1 diabetes mellitus with hyperglycemia (HCC)   . Hypothyroidism, unspecified type   . History of anemia   . Mental health problem       -counseled pt about risks and benefits of covid vaccination and encouraged him to get vaccinated  -pt was given  [facility]/ [facility] financial assistance application  -Refer to Dr Fransico Him who treated him in the past  -will update labs  -He says he has 3 month supply of meds ready for pickup and has insulin  9 pens  -pt to follow up  here 1 month.  He  is to contact office sooner prn

## 2019-12-23 ENCOUNTER — Other Ambulatory Visit (HOSPITAL_COMMUNITY)
Admission: RE | Admit: 2019-12-23 | Discharge: 2019-12-23 | Disposition: A | Discharge status: Home / Self Care | Payer: Medicaid Other | Source: Ambulatory Visit | Attending: Physician Assistant | Admitting: Physician Assistant

## 2019-12-23 DIAGNOSIS — E1065 Type 1 diabetes mellitus with hyperglycemia: Secondary | ICD-10-CM

## 2019-12-23 DIAGNOSIS — Z862 Personal history of diseases of the blood and blood-forming organs and certain disorders involving the immune mechanism: Secondary | ICD-10-CM | POA: Insufficient documentation

## 2019-12-23 DIAGNOSIS — E039 Hypothyroidism, unspecified: Secondary | ICD-10-CM | POA: Insufficient documentation

## 2019-12-23 LAB — COMPREHENSIVE METABOLIC PANEL
ALT: 38 U/L (ref 0–44)
AST: 40 U/L (ref 15–41)
Albumin: 4.4 g/dL (ref 3.5–5.0)
Alkaline Phosphatase: 118 U/L (ref 38–126)
Anion gap: 9 (ref 5–15)
BUN: 7 mg/dL (ref 6–20)
CO2: 29 mmol/L (ref 22–32)
Calcium: 9 mg/dL (ref 8.9–10.3)
Chloride: 96 mmol/L — ABNORMAL LOW (ref 98–111)
Creatinine, Ser: 0.84 mg/dL (ref 0.61–1.24)
GFR calc Af Amer: >60 mL/min (ref >60)
GFR calc non Af Amer: >60 mL/min (ref >60)
Glucose, Bld: 242 mg/dL — ABNORMAL HIGH (ref 70–99)
Potassium: 3.7 mmol/L (ref 3.5–5.1)
Sodium: 134 mmol/L — ABNORMAL LOW (ref 135–145)
Total Bilirubin: 0.4 mg/dL (ref 0.3–1.2)
Total Protein: 7.1 g/dL (ref 6.5–8.1)

## 2019-12-23 LAB — HEMOGLOBIN A1C
Hgb A1c MFr Bld: 11 % — ABNORMAL HIGH (ref 4.8–5.6)
Mean Plasma Glucose: 269 mg/dL

## 2019-12-23 LAB — LIPID PANEL
Cholesterol: 174 mg/dL (ref 0–200)
HDL: 54 mg/dL (ref >40)
LDL Cholesterol: 106 mg/dL — ABNORMAL HIGH (ref 0–99)
Total CHOL/HDL Ratio: 3.2 RATIO
Triglycerides: 72 mg/dL (ref <150)
VLDL: 14 mg/dL (ref 0–40)

## 2019-12-23 LAB — CBC
HCT: 38.3 % — ABNORMAL LOW (ref 39.0–52.0)
Hemoglobin: 12.9 g/dL — ABNORMAL LOW (ref 13.0–17.0)
MCH: 30.2 pg (ref 26.0–34.0)
MCHC: 33.7 g/dL (ref 30.0–36.0)
MCV: 89.7 fL (ref 80.0–100.0)
Platelets: 195 10*3/uL (ref 150–400)
RBC: 4.27 MIL/uL (ref 4.22–5.81)
RDW: 12.8 % (ref 11.5–15.5)
WBC: 6 10*3/uL (ref 4.0–10.5)
nRBC: 0 % (ref 0.0–0.2)

## 2019-12-23 LAB — TSH: TSH: 181.194 u[IU]/mL — ABNORMAL HIGH (ref 0.350–4.500)

## 2019-12-23 LAB — T4, FREE: Free T4: 0.37 ng/dL — ABNORMAL LOW (ref 0.61–1.12)

## 2019-12-24 LAB — MICROALBUMIN, URINE: Microalb, Ur: 7.3 ug/mL — ABNORMAL HIGH

## 2019-12-28 ENCOUNTER — Encounter: Payer: Medicaid Other | Attending: Physician Assistant | Admitting: Nutrition

## 2019-12-28 ENCOUNTER — Ambulatory Visit (INDEPENDENT_AMBULATORY_CARE_PROVIDER_SITE_OTHER): Payer: Self-pay | Admitting: Nurse Practitioner

## 2019-12-28 ENCOUNTER — Other Ambulatory Visit: Payer: Self-pay

## 2019-12-28 ENCOUNTER — Encounter: Payer: Self-pay | Admitting: Nurse Practitioner

## 2019-12-28 VITALS — BP 125/81 | HR 65 | Ht 64.25 in | Wt 124.6 lb

## 2019-12-28 DIAGNOSIS — E1065 Type 1 diabetes mellitus with hyperglycemia: Secondary | ICD-10-CM

## 2019-12-28 DIAGNOSIS — E039 Hypothyroidism, unspecified: Secondary | ICD-10-CM

## 2019-12-28 MED ORDER — LEVOTHYROXINE SODIUM 100 MCG PO TABS: 100.0000 ug | ORAL_TABLET | Freq: Every day | ORAL | 2 refills | Status: DC

## 2019-12-28 NOTE — Progress Notes (Signed)
Endocrinology follow-up note       12/28/2019, 2:40 PM   Subjective:    Patient ID: Kevin Cooper., male    DOB: 1981-03-14.  Kevin Cooper. is being seen in follow-up for management of currently uncontrolled symptomatic type 1 diabetes, hypothyroidism. PMD:   Kevin Hawking, PA-C.   Past Medical History:  Diagnosis Date  . Chronic right hip pain   . Depression   . Diabetes mellitus    diagnosed age 39  . Seizures (HCC)    last one january 2016 due to low bs  . Thyroid disease    Past Surgical History:  Procedure Laterality Date  . EYE SURGERY Right age 11   Social History   Socioeconomic History  . Marital status: Legally Separated    Spouse name: Not on file  . Number of children: Not on file  . Years of education: Not on file  . Highest education level: Not on file  Occupational History  . Not on file  Tobacco Use  . Smoking status: Former Smoker    Packs/day: 2.00    Years: 4.00    Pack years: 8.00    Types: Cigarettes    Quit date: 05/29/2003    Years since quitting: 16.5  . Smokeless tobacco: Current User    Types: Chew  Vaping Use  . Vaping Use: Never used  Substance and Sexual Activity  . Alcohol use: No    Comment: occassional rare beer or liqour  . Drug use: No  . Sexual activity: Yes    Birth control/protection: Condom  Other Topics Concern  . Not on file  Social History Narrative  . Not on file   Social Determinants of Health   Financial Resource Strain:   . Difficulty of Paying Living Expenses:   Food Insecurity:   . Worried About Programme researcher, broadcasting/film/video in the Last Year:   . Barista in the Last Year:   Transportation Needs:   . Freight forwarder (Medical):   Marland Kitchen Lack of Transportation (Non-Medical):   Physical Activity:   . Days of Exercise per Week:   . Minutes of Exercise per Session:   Stress:   . Feeling of Stress :   Social Connections:   .  Frequency of Communication with Friends and Family:   . Frequency of Social Gatherings with Friends and Family:   . Attends Religious Services:   . Active Member of Clubs or Organizations:   . Attends Banker Meetings:   Marland Kitchen Marital Status:    Outpatient Encounter Medications as of 12/28/2019  Medication Sig  . FLUoxetine (PROZAC) 20 MG capsule Take 20 mg by mouth at bedtime.  . gabapentin (NEURONTIN) 300 MG capsule Take 300 mg by mouth 2 (two) times daily.  . insulin aspart (NOVOLOG) 100 UNIT/ML injection Inject 8-11 Units into the skin 3 (three) times daily before meals. (Patient taking differently: Inject 1-5 Units into the skin 3 (three) times daily before meals. )  . Insulin Degludec (TRESIBA [place]) Inject 30 Units into the skin.  Marland Kitchen levothyroxine (SYNTHROID) 100 MCG tablet Take 1 tablet (  100 mcg total) by mouth daily before breakfast.  . [DISCONTINUED] levothyroxine (SYNTHROID) 100 MCG tablet TAKE (1) TABLET DAILY BEFORE BREAKFAST.  . [DISCONTINUED] FLUoxetine HCl (PROZAC PO) Take 1 tablet by mouth daily.   No facility-administered encounter medications on file as of 12/28/2019.    ALLERGIES: Allergies  Allergen Reactions  . Tomato Hives, Itching and Rash  . Penicillins Other (See Comments)    UNKNOWN REACTION    VACCINATION STATUS:  There is no immunization history on file for this patient.  Diabetes He presents for his follow-up diabetic visit. He has type 1 diabetes mellitus. Onset time: He was diagnosed at approximate age of 8 years. His disease course has been worsening. Hypoglycemia symptoms include hunger and sweats. Pertinent negatives for hypoglycemia include no confusion, dizziness, headaches, nervousness/anxiousness, pallor or seizures. Associated symptoms include fatigue, polydipsia, polyuria and weight loss. Pertinent negatives for diabetes include no chest pain, no polyphagia and no weakness. There are no hypoglycemic complications. Symptoms are worsening.  Diabetic complications include peripheral neuropathy. Risk factors for coronary artery disease include diabetes mellitus, tobacco exposure, sedentary lifestyle, family history and male sex. Current diabetic treatment includes insulin injections (He is currently on Tresiba 35 units every morning and NovoLog sliding scale.). He is compliant with treatment some of the time. His weight is fluctuating minimally. He is following a generally unhealthy diet. When asked about meal planning, he reported none. He has not had a previous visit with a dietitian. He rarely participates in exercise. His home blood glucose trend is fluctuating dramatically. His overall blood glucose range is >200 mg/dl. (Patient presents to the clinic today without his logs.  He does come with his Dexcom CGM device.  His previsit A1c is 11%, worsening from prior visits.  He reports he has very little appetite and thus does not eat 3 meals a day.  There are no reported or documented episodes of hypoglycemia.) An ACE inhibitor/angiotensin II receptor blocker is not being taken. He does not see a podiatrist.Eye exam is current.  Thyroid Problem Presents for initial visit. Symptoms include diarrhea, fatigue and weight loss. Patient reports no anxiety, cold intolerance, constipation, heat intolerance or palpitations. The symptoms have been worsening. Past treatments include levothyroxine. His past medical history is significant for diabetes. Risk factors include family history of hypothyroidism.      Review of Systems  Constitutional: Positive for fatigue and weight loss. Negative for chills, fever and unexpected weight change.  HENT: Negative for dental problem, mouth sores and trouble swallowing.   Eyes: Negative for visual disturbance.  Respiratory: Negative for cough, choking, chest tightness, shortness of breath and wheezing.   Cardiovascular: Negative for chest pain, palpitations and leg swelling.  Gastrointestinal: Positive for  diarrhea. Negative for abdominal distention, abdominal pain, constipation, nausea and vomiting.  Endocrine: Positive for polydipsia and polyuria. Negative for cold intolerance, heat intolerance and polyphagia.  Genitourinary: Negative for dysuria, flank pain, hematuria and urgency.  Musculoskeletal: Negative for back pain, gait problem, myalgias and neck pain.  Skin: Negative for pallor, rash and wound.  Neurological: Negative for dizziness, seizures, syncope, weakness, numbness and headaches.  Psychiatric/Behavioral: Negative for confusion and dysphoric mood. The patient is not nervous/anxious.     Objective:    BP 125/81 (BP Location: Left Arm, Patient Position: Sitting)   Pulse 65   Ht 5' 4.25" (1.632 m)   Wt 124 lb 9.6 oz (56.5 kg)   BMI 21.22 kg/m   Wt Readings from Last 3 Encounters:  12/28/19 124 lb  9.6 oz (56.5 kg)  12/10/19 123 lb 8 oz (56 kg)  07/29/18 126 lb (57.2 kg)      Physical Exam- Limited  Constitutional:  Body mass index is 21.22 kg/m. , not in acute distress, normal state of mind Eyes:  EOMI, no exophthalmos Neck: Supple Thyroid: No gross goiter Cardiovascular: RRR, no murmers, rubs, or gallops, no edema Respiratory: Adequate breathing efforts, no crackles, rales, rhonchi, or wheezing Musculoskeletal: no gross deformities, strength intact in all four extremities, no gross restriction of joint movements Skin:  no rashes, no hyperemia, extensive tattoos Neurological: no tremor with outstretched hands  CMP ( most recent) CMP     Component Value Date/Time   NA 134 (L) 12/23/2019 1050   K 3.7 12/23/2019 1050   CL 96 (L) 12/23/2019 1050   CO2 29 12/23/2019 1050   GLUCOSE 242 (H) 12/23/2019 1050   BUN 7 12/23/2019 1050   CREATININE 0.84 12/23/2019 1050   CREATININE 0.87 07/15/2018 1111   CALCIUM 9.0 12/23/2019 1050   PROT 7.1 12/23/2019 1050   ALBUMIN 4.4 12/23/2019 1050   AST 40 12/23/2019 1050   ALT 38 12/23/2019 1050   ALKPHOS 118 12/23/2019 1050    BILITOT 0.4 12/23/2019 1050   GFRNONAA >60 12/23/2019 1050   GFRNONAA 109 07/15/2018 1111   GFRAA >60 12/23/2019 1050   GFRAA 127 07/15/2018 1111    Diabetic Labs (most recent): Lab Results  Component Value Date   HGBA1C 11.0 (H) 12/23/2019   HGBA1C 9.4 (H) 07/15/2018   HGBA1C 10.4 03/07/2018     Lab Results  Component Value Date   TSH 181.194 (H) 12/23/2019   TSH 14.02 (H) 07/15/2018   TSH 134.12 (H) 08/11/2015   TSH 32.191 (H) 10/30/2011   FREET4 0.37 (L) 12/23/2019   FREET4 1.3 07/15/2018   FREET4 0.45 (L) 11/01/2011    Lipid Panel     Component Value Date/Time   CHOL 174 12/23/2019 1050   TRIG 72 12/23/2019 1050   HDL 54 12/23/2019 1050   CHOLHDL 3.2 12/23/2019 1050   VLDL 14 12/23/2019 1050   LDLCALC 106 (H) 12/23/2019 1050   LDLCALC 63 07/15/2018 1111    Assessment & Plan:   1. Uncontrolled type 1 diabetes mellitus with hyperglycemia (HCC) - [name]. has currently uncontrolled symptomatic type 1 DM since 39 years of age.  Patient presents to the clinic today without his logs.  He does come with his Dexcom CGM device.  His previsit A1c is 11%, worsening from prior visits.  He reports he has very little appetite and thus does not eat 3 meals a day.  There are no reported or documented episodes of hypoglycemia.  -his diabetes is complicated by peripheral neuropathy, tobacco use/abuse and he remains at a high risk for more acute and chronic complications which include CAD, CVA, CKD, retinopathy, and neuropathy. These are all discussed in detail with him.  - I have counseled him on diet management by adopting a carbohydrate restricted/protein rich diet.  - The patient admits there is a room for improvement in their diet and drink choices. -  Suggestion is made for the patient to avoid simple carbohydrates from their diet including Cakes, Sweet Desserts / Pastries, Ice Cream, Soda (diet and regular), Sweet Tea, Candies, Chips, Cookies, Sweet Pastries,  Store  Bought Juices, Alcohol in Excess of  1-2 drinks a day, Artificial Sweeteners, Coffee Creamer, and "Sugar-free" Products. This will help patient to have stable blood glucose profile and potentially avoid  unintended weight gain.   - I encouraged the patient to switch to  unprocessed or minimally processed complex starch and increased protein intake (animal or plant source), fruits, and vegetables.   - Patient is advised to stick to a routine mealtimes to eat 3 meals  a day and avoid unnecessary snacks ( to snack only to correct hypoglycemia).   - he  Has been scheduled with Norm SaltPenny Crumpton, RDN, CDE for individualized diabetes education previously and will meet with her again today.  - I have approached him with the following individualized plan to manage diabetes and patient agrees:   -He will continue to require intensive treatment with basal/bolus insulin in order for him to achieve and maintain control of diabetes to target.  For this to happen, he needs to engage optimally for proper monitoring of blood glucose.  He is advised to use his CGM device at all times and bring printout to subsequent visits and to log his glucose readings until next visit in 10 days.  Discussed and initiated increase in [place] to 30 units SQ nightly.  Will stay on same dose of NovoLog 8 to 11 units 3 times daily AC for now.  Patient specific sliding scale dose given to patient.  - he is warned not to take insulin without proper monitoring per orders. - Adjustment parameters are given to him for hypo and hyperglycemia in writing. - he is encouraged to call clinic for blood glucose levels less than 70 or above 300 mg /dl.  For 3 in a row. -He is not a candidate for non-insulin medications for diabetes management.  - Patient specific target  A1c;  LDL, HDL, Triglycerides, and  Waist Circumference were discussed in detail.  2) Blood Pressure /Hypertension: His blood pressure is controlled to target.  He is not on any  antihypertensives at this time.   3) Lipids/Hyperlipidemia:  His recent lipid panel shows LDL at 106, slightly up from last visit.  He is not currently on any medications for cholesterol.  He will be considered for low-dose statin if subsequent lipid panel shows unchanged or worsening cholesterol.  4)  Weight/Diet:  Body mass index is 21.22 kg/m.  He is not a candidate for weight loss.  CDE Consult will be initiated . Exercise, and detailed carbohydrates information provided  -  detailed on discharge instructions.  5) hypothyroidism His previsit thyroid function studies shows a TSH of 181 and a free T4 of 0.37.  He reports he has ran out of his medication and has not been able to get any for the last 2 weeks. New Rx given to patient to [facility] levothyroxine 100 mcg p.o. daily before breakfast.   - We discussed about the correct intake of his thyroid hormone, on empty stomach at fasting, with water, separated by at least 30 minutes from breakfast and other medications,  and separated by more than 4 hours from calcium, iron, multivitamins, acid reflux medications (PPIs). -Patient is made aware of the fact that thyroid hormone replacement is needed for life, dose to be adjusted by periodic monitoring of thyroid function tests.   6) Chronic Care/Health Maintenance:  -he  is not on ACEI/ARB or Statin medications and  is encouraged to initiate and continue to follow up with Ophthalmology, Dentist,  Podiatrist at least yearly or according to recommendations, and advised to quit tobacco use/abuse. I have recommended yearly flu vaccine and pneumonia vaccine at least every 5 years; moderate intensity exercise for up  to 150 minutes weekly; and  sleep for at least 7 hours a day.  - he is  advised to maintain close follow up with Kevin Hawking, PA-C for primary care needs, as well as his other providers for optimal and coordinated care.  - Time spent on this patient care encounter:  35  min, of which > 50% was spent in  counseling and the rest reviewing his blood glucose logs , discussing his hypoglycemia and hyperglycemia episodes, reviewing his current and  previous labs / studies  ( including abstraction from other facilities) and medications  doses and developing a  long term treatment plan and documenting his care.   Please refer to Patient Instructions for Blood Glucose Monitoring and Insulin/Medications Dosing Guide"  in media tab for additional information. Please  also refer to " Patient Self Inventory" in the Media  tab for reviewed elements of pertinent patient history.  [name]. and his spouse participated in the discussions, expressed understanding, and voiced agreement with the above plans.  All questions were answered to his satisfaction. he is encouraged to contact clinic should he have any questions or concerns prior to his return visit.    Follow up plan: - Return in about 10 days (around 01/07/2020) for Bring glucometer and logs.  Ronny Bacon, FNP-BC [facility] Phone: [phone] Fax: [phone]   12/28/2019, 2:40 PM  This note was partially dictated with voice recognition software. Similar sounding words can be transcribed inadequately or may not  be corrected upon review.

## 2019-12-28 NOTE — Patient Instructions (Signed)

## 2019-12-28 NOTE — Progress Notes (Signed)
Medical Nutrition Therapy:  Appt start time: 1400 end time:  1430.   Assessment:  Primary concerns today: Type 1 Dx when 10 yrs ago. Walk in from Ronny Bacon, NP with [facility]. Will do safety questions next visit in 10 days. Here with his wife of 2 yrs. Tresiba 30 units and Novolog 3-8 units depending on blood sugars with slidiing scale. Eats 2-3 meals per day, sometimes only 1 meal a day. Has GI issues from inconsistent eating and uncontrolled blood sugars. He admits to not taking his meal insulin correctly. He notes he sometimes take it and doesn't eat or eat enough and his BS drops really low. He is willing to work on eating better balanced meals at the right times of the day  Lab Results  Component Value Date   HGBA1C 11.0 (H) 12/23/2019   CMP Latest Ref Rng & Units 12/23/2019 07/15/2018 08/11/2015  Glucose 70 - 99 mg/dL 315(V) 761(Y) 073(X)  BUN 6 - 20 mg/dL 7 12 12   Creatinine 0.61 - 1.24 mg/dL 1.06 2.69  Sodium 135 - 145 mmol/L 134(L) 134(L) 132(L)  Potassium 3.5 - 5.1 mmol/L 3.7 4.3 4.0  Chloride 98 - 111 mmol/L 96(L) 96(L) 94(L)  CO2 22 - 32 mmol/L 29 29 28   Calcium 8.9 - 10.3 mg/dL 9.0 9.7 8.9  Total Protein 6.5 - 8.1 g/dL 7.1 7.7 -  Total Bilirubin 0.3 - 1.2 mg/dL 0.4 0.5 -  Alkaline Phos 38 - 126 U/L 118 - -  AST 15 - 41 U/L 40 18 -  ALT 0 - 44 U/L 38 13 -   Wt Readings from Last 3 Encounters:  12/28/19 124 lb 9.6 oz (56.5 kg)  12/10/19 123 lb 8 oz (56 kg)  07/29/18 126 lb (57.2 kg)   Ht Readings from Last 3 Encounters:  12/28/19 5' 4.25" (1.632 m)  12/10/19 5' 4.25" (1.632 m)  07/29/18 5\' 5"  (1.651 m)   There is no height or weight on file to calculate BMI. @BMIFA @ Facility age limit for growth percentiles is 20 years. Facility age limit for growth percentiles is 20 years.   Preferred Learning Style:   Auditory  Visual-doesn't like to read much but prefers to "see" stuff.  Hands on    Learning Readiness:   Ready  Change in  progress   MEDICATIONS: see chart   DIETARY INTAKE:  24-hr recall:  B)  Drinks water, coffee, energy drinks and diet ginerale   L) Dl 8 pm salsbury steak, mashed potatoes and corn, Diet Gingerale. 230-3 pm am pb bar salty/sweet bar.     occassional alcohol-beer ' 1/2 per month. or [place] with gingeral with lime juice 1-2  Once or twice a month.  Usual physical activity: ADL. Walks with cane. For about 2 yrs due to arthritiis and hip issues. He can't work due to his medical issues. No insurance. Applying for disability and Medicaid.  Estimated energy needs: 1800-2000 calories 225 CHO Pro: 150 56 g fat  Progress Towards Goal(s):  In progress.   Nutritional Diagnosis:  NB-1.1 Food and nutrition-related knowledge deficit As related to Type 1 DM  As evidenced by  A1C > 10% .    Intervention:  Nutrition and Diabetes education provided on My Plate, CHO counting, meal planning, portion sizes, timing of meals, avoiding snacks between meals unless having a low blood sugar, target ranges for A1C and blood sugars, signs/symptoms and treatment of hyper/hypoglycemia, monitoring blood sugars, taking medications as prescribed, benefits of exercising 30 minutes per  day and prevention of complications of DM.  Goals  Follow My Plate Eat 3-4 carb choices per meal Increase water intake  6 water bottles per day Cut out sugar free beverages Eat meals on time Do not skip meals Test blood sugars 4 times per day Record blood sugars on sheets  Teaching Method Utilized:  Visual Auditory Hands on  Handouts given during visit include:  The Plate Method   Meal Plan Card  Diabetes Instructions.   Barriers to learning/adherence to lifestyle change: none  Demonstrated degree of understanding via:  Teach Back   Monitoring/Evaluation:  Dietary intake, exercise,  , and body weight in a month or less. Marland Kitchen

## 2019-12-28 NOTE — Patient Instructions (Signed)
Goals  Follow My Plate Eat 3-4 carb choices per meal Increase water intake  6 water bottles per day Cut out sugar free beverages Eat meals on time Do not skip meals Test blood sugars 4 times per day Record blood sugars on sheets

## 2019-12-29 ENCOUNTER — Encounter: Payer: Self-pay | Admitting: Nutrition

## 2019-12-31 ENCOUNTER — Ambulatory Visit: Payer: Medicaid Other | Admitting: Nurse Practitioner

## 2020-01-07 ENCOUNTER — Ambulatory Visit (INDEPENDENT_AMBULATORY_CARE_PROVIDER_SITE_OTHER): Payer: Self-pay | Admitting: Nurse Practitioner

## 2020-01-07 ENCOUNTER — Encounter: Payer: Medicaid Other | Attending: "Endocrinology | Admitting: Nutrition

## 2020-01-07 ENCOUNTER — Other Ambulatory Visit: Payer: Self-pay

## 2020-01-07 ENCOUNTER — Encounter: Payer: Self-pay | Admitting: Nutrition

## 2020-01-07 ENCOUNTER — Encounter: Payer: Self-pay | Admitting: Nurse Practitioner

## 2020-01-07 VITALS — BP 114/75 | HR 81 | Wt 124.4 lb

## 2020-01-07 VITALS — Wt 124.4 lb

## 2020-01-07 DIAGNOSIS — E039 Hypothyroidism, unspecified: Secondary | ICD-10-CM

## 2020-01-07 DIAGNOSIS — E1065 Type 1 diabetes mellitus with hyperglycemia: Secondary | ICD-10-CM

## 2020-01-07 DIAGNOSIS — E782 Mixed hyperlipidemia: Secondary | ICD-10-CM | POA: Insufficient documentation

## 2020-01-07 MED ORDER — TRESIBA 100 UNIT/ML ~~LOC~~ SOLN
26.0000 [IU] | Freq: Every evening | SUBCUTANEOUS | 3 refills | Status: DC
Start: 2020-01-07 — End: 2020-07-25

## 2020-01-07 MED ORDER — INSULIN ASPART 100 UNIT/ML ~~LOC~~ SOLN: 5.0000 [IU] | Freq: Three times a day (TID) | SUBCUTANEOUS | 0 refills | Status: DC

## 2020-01-07 NOTE — Patient Instructions (Signed)

## 2020-01-07 NOTE — Progress Notes (Signed)
Medical Nutrition Therapy:  Appt start time: 1400 end time:  1430.   Assessment:  Primary concerns today: Type 1 Dx when 10 yrs ago.  Follow up. Here with his wife.  Has had a few low blood sugars. Saw Ronny Bacon, NP today and she reduced insulin; Tresiba 26 units and Novolog 5 units with meals. Using sliding scale. Sometimes takes insulin without eating. . Has on going issues with possible Irritable bowels. Suppose to work withy GI on this. Current diet is insuffient to meet his needs. His wife is cooking more and trying to provide better balanced meals. Access to food is an issues with finances. He needs to eat more CHO and calories with meals 3 times per day. Making slow progress. His wife is undertstanding and trying to prepare the right foods. But he is at home by himself at times and won't fix himself a meal at times.. Overall, doing better.     Lab Results  Component Value Date   HGBA1C 11.0 (H) 12/23/2019   CMP Latest Ref Rng & Units 12/23/2019 07/15/2018 08/11/2015  Glucose 70 - 99 mg/dL 784(O) 962(X) 528(U)  BUN 6 - 20 mg/dL 7 12 12   Creatinine 0.61 - 1.24 mg/dL 1.32 4.40  Sodium 135 - 145 mmol/L 134(L) 134(L) 132(L)  Potassium 3.5 - 5.1 mmol/L 3.7 4.3 4.0  Chloride 98 - 111 mmol/L 96(L) 96(L) 94(L)  CO2 22 - 32 mmol/L 29 29 28   Calcium 8.9 - 10.3 mg/dL 9.0 9.7 8.9  Total Protein 6.5 - 8.1 g/dL 7.1 7.7 -  Total Bilirubin 0.3 - 1.2 mg/dL 0.4 0.5 -  Alkaline Phos 38 - 126 U/L 118 - -  AST 15 - 41 U/L 40 18 -  ALT 0 - 44 U/L 38 13 -   Wt Readings from Last 3 Encounters:  01/07/20 124 lb 6.4 oz (56.4 kg)  01/07/20 124 lb 6.4 oz (56.4 kg)  12/28/19 124 lb 9.6 oz (56.5 kg)   Ht Readings from Last 3 Encounters:  12/28/19 5' 4.25" (1.632 m)  12/10/19 5' 4.25" (1.632 m)  07/29/18 5\' 5"  (1.651 m)   Body mass index is 21.19 kg/m. @BMIFA @ Facility age limit for growth percentiles is 20 years. Facility age limit for growth percentiles is 20 years.   Preferred  Learning Style:   Auditory  Visual-doesn't like to read much but prefers to "see" stuff.  Hands on    Learning Readiness:   Ready  Change in progress   MEDICATIONS: see chart   DIETARY INTAKE:  24-hr recall:  B)  Drinks water, coffee, energy drinks and diet ginerale   L) Dl 8 pm salsbury steak, mashed potatoes and corn, Diet Gingerale. 230-3 pm am pb bar salty/sweet bar.     occassional alcohol-beer ' 1/2 per month. or [place]bon with gingeral with lime juice 1-2  Once or twice a month.  Usual physical activity: ADL. Walks with cane. For about 2 yrs due to arthritiis and hip issues. He can't work due to his medical issues. No insurance. Applying for disability and Medicaid.  Estimated energy needs: 1800-2000 calories 225 CHO Pro: 150 56 g fat  Progress Towards Goal(s):  In progress.   Nutritional Diagnosis:  NB-1.1 Food and nutrition-related knowledge deficit As related to Type 1 DM  As evidenced by  A1C > 10% .    Intervention:  Nutrition and Diabetes education provided on My Plate, CHO counting, meal planning, portion sizes, timing of meals, avoiding snacks between meals unless  having a low blood sugar, target ranges for A1C and blood sugars, signs/symptoms and treatment of hyper/hypoglycemia, monitoring blood sugars, taking medications as prescribed, benefits of exercising 30 minutes per day and prevention of complications of DM.  Goals  Follow My Plate Eat 3-4 carb choices per meal Increase water intake  6 water bottles per day Cut out sugar free beverages Eat meals on time Do not skip meals Test blood sugars 4 times per day Record blood sugars on sheets Dont' take insulin if not eating a meal. Prevent hypoglycemia.    Teaching Method Utilized:  Visual Auditory Hands on  Handouts given during visit include:  The Plate Method   Meal Plan Card  Diabetes Instructions.   Barriers to learning/adherence to lifestyle change: none  Demonstrated degree  of understanding via:  Teach Back   Monitoring/Evaluation:  Dietary intake, exercise,  , and body weight in a month or less in 2-3 months. Will try to reapply for Medicaid.

## 2020-01-07 NOTE — Progress Notes (Signed)
Endocrinology follow-up note       01/07/2020, 2:33 PM   Subjective:    Patient ID: Kevin Greenlaw., male    DOB: 07-13-1980.  Kevin Grosso. is being seen in follow-up for management of currently uncontrolled symptomatic type 1 diabetes, hypothyroidism. PMD:   Jacquelin Hawking, PA-C.   Past Medical History:  Diagnosis Date   Chronic right hip pain    Depression    Diabetes mellitus    diagnosed age 39   Seizures (HCC)    last one january 2016 due to low bs   Thyroid disease    Past Surgical History:  Procedure Laterality Date   EYE SURGERY Right age 55   Social History   Socioeconomic History   Marital status: Legally Separated    Spouse name: Not on file   Number of children: Not on file   Years of education: Not on file   Highest education level: Not on file  Occupational History   Not on file  Tobacco Use   Smoking status: Former Smoker    Packs/day: 2.00    Years: 4.00    Pack years: 8.00    Types: Cigarettes    Quit date: 05/29/2003    Years since quitting: 16.6   Smokeless tobacco: Current User    Types: Associate Professor Use: Never used  Substance and Sexual Activity   Alcohol use: No    Comment: occassional rare beer or liqour   Drug use: No   Sexual activity: Yes    Birth control/protection: Condom  Other Topics Concern   Not on file  Social History Narrative   Not on file   Social Determinants of Health   Financial Resource Strain:    Difficulty of Paying Living Expenses:   Food Insecurity:    Worried About Programme researcher, broadcasting/film/video in the Last Year:    Barista in the Last Year:   Transportation Needs:    Freight forwarder (Medical):    Lack of Transportation (Non-Medical):   Physical Activity:    Days of Exercise per Week:    Minutes of Exercise per Session:   Stress:    Feeling of Stress :   Social Connections:     Frequency of Communication with Friends and Family:    Frequency of Social Gatherings with Friends and Family:    Attends Religious Services:    Active Member of Clubs or Organizations:    Attends Banker Meetings:    Marital Status:    Outpatient Encounter Medications as of 01/07/2020  Medication Sig   FLUoxetine (PROZAC) 20 MG capsule Take 20 mg by mouth at bedtime.   gabapentin (NEURONTIN) 300 MG capsule Take 300 mg by mouth 2 (two) times daily.   insulin aspart (NOVOLOG) 100 UNIT/ML injection Inject 5-8 Units into the skin 3 (three) times daily with meals.   Insulin Degludec (TRESIBA) 100 UNIT/ML SOLN Inject 26 Units into the skin at bedtime.   levothyroxine (SYNTHROID) 100 MCG tablet Take 1 tablet (100 mcg total) by mouth daily before breakfast.   [DISCONTINUED] insulin  aspart (NOVOLOG) 100 UNIT/ML injection Inject 8-11 Units into the skin 3 (three) times daily before meals. (Patient taking differently: Inject 1-5 Units into the skin 3 (three) times daily before meals. )   [DISCONTINUED] Insulin Degludec (TRESIBA [place]) Inject 26 Units into the skin at bedtime.   No facility-administered encounter medications on file as of 01/07/2020.    ALLERGIES: Allergies  Allergen Reactions   Tomato Hives, Itching and Rash   Penicillins Other (See Comments)    UNKNOWN REACTION    VACCINATION STATUS:  There is no immunization history on file for this patient.  Diabetes He presents for his follow-up diabetic visit. He has type 1 diabetes mellitus. Onset time: He was diagnosed at approximate age of 8 years. His disease course has been improving. Hypoglycemia symptoms include hunger, nervousness/anxiousness and sweats. Pertinent negatives for hypoglycemia include no confusion, dizziness or pallor. Pertinent negatives for diabetes include no chest pain, no fatigue, no polydipsia, no polyphagia, no polyuria, no weakness and no weight loss. Hypoglycemia complications  include nocturnal hypoglycemia. (He reports that his wife will hear his Dexcom alert and wake him up in the middle of the night for low blood sugars.) Symptoms are improving. Diabetic complications include peripheral neuropathy. Risk factors for coronary artery disease include diabetes mellitus, tobacco exposure, sedentary lifestyle, family history and male sex. Current diabetic treatment includes insulin injections. He is compliant with treatment most of the time. His weight is fluctuating minimally. He is following a generally healthy (since last visit when he sat with Boyd Kerbs, RDE) diet. Meal planning includes carbohydrate counting, avoidance of concentrated sweets and ADA exchanges. He has not had a previous visit with a dietitian. He rarely participates in exercise. His home blood glucose trend is decreasing steadily. His breakfast blood glucose range is generally 70-90 mg/dl. His lunch blood glucose range is generally 70-90 mg/dl. His dinner blood glucose range is generally 110-130 mg/dl. His bedtime blood glucose range is generally >200 mg/dl. (Patient presents with his logs and his Dexcom showing improving glycemic profile overall.  He does have frequent fasting and postprandial hypoglycemia noted.  He reports that his symptoms have improved since last visit but not completely resolved.) An ACE inhibitor/angiotensin II receptor blocker is not being taken. He does not see a podiatrist.Eye exam is current.  Thyroid Problem Presents for initial visit. Symptoms include anxiety and diarrhea. Patient reports no cold intolerance, constipation, fatigue, heat intolerance, palpitations or weight loss. The symptoms have been improving. Past treatments include levothyroxine. The treatment provided mild relief. His past medical history is significant for diabetes. Risk factors include family history of hypothyroidism.  Erectile Dysfunction This is a chronic problem. The current episode started more than 1 year ago. The  problem has been gradually worsening since onset. The nature of his difficulty is achieving erection and maintaining erection. Non-physiologic factors contributing to erectile dysfunction are anxiety. He reports no decreased libido. Irritative symptoms do not include urgency. Pertinent negatives include no chills, dysuria or hematuria. Past treatments include nothing. Risk factors include diabetes mellitus, penile trauma and tobacco use (was stepped on by a horse as a teenager in the groin causing trauma).      Review of Systems  Constitutional: Negative for chills, fatigue, unexpected weight change and weight loss.  HENT: Negative for dental problem and trouble swallowing.   Eyes: Negative for visual disturbance.  Respiratory: Negative for choking, chest tightness and wheezing.   Cardiovascular: Negative for chest pain, palpitations and leg swelling.  Gastrointestinal: Positive for diarrhea and  vomiting. Negative for abdominal distention, constipation and nausea.       Intermittent nocturnal fecal incontinence  Endocrine: Negative for cold intolerance, heat intolerance, polydipsia, polyphagia and polyuria.  Genitourinary: Negative for decreased libido, dysuria, flank pain, hematuria and urgency.       Difficulty achieving and maintaining an erection  Musculoskeletal: Positive for arthralgias. Negative for back pain, gait problem, myalgias and neck pain.  Skin: Negative for pallor, rash and wound.  Neurological: Negative for dizziness, weakness and numbness.  Psychiatric/Behavioral: Negative for confusion and dysphoric mood. The patient is nervous/anxious.     Objective:    BP 114/75 (BP Location: Right Arm, Patient Position: Sitting)    Pulse 81    Wt 124 lb 6.4 oz (56.4 kg)    BMI 21.19 kg/m   Wt Readings from Last 3 Encounters:  01/07/20 124 lb 6.4 oz (56.4 kg)  12/28/19 124 lb 9.6 oz (56.5 kg)  12/10/19 123 lb 8 oz (56 kg)    BP Readings from Last 3 Encounters:  01/07/20 114/75   12/28/19 125/81  12/10/19 128/90     Physical Exam- Limited  Constitutional:  Body mass index is 21.19 kg/m. , not in acute distress, appears less anxious this visit Eyes:  EOMI, no exophthalmos Neck: Supple Thyroid: No gross goiter Cardiovascular: RRR, no murmers, rubs, or gallops, no edema Respiratory: Adequate breathing efforts, no crackles, rales, rhonchi, or wheezing Musculoskeletal: no gross deformities, strength intact in all four extremities, no gross restriction of joint movements, walks with cane Skin:  no rashes, no hyperemia, extensive tattoos Neurological: no tremor with outstretched hands  CMP ( most recent) CMP     Component Value Date/Time   NA 134 (L) 12/23/2019 1050   K 3.7 12/23/2019 1050   CL 96 (L) 12/23/2019 1050   CO2 29 12/23/2019 1050   GLUCOSE 242 (H) 12/23/2019 1050   BUN 7 12/23/2019 1050   CREATININE 0.84 12/23/2019 1050   CREATININE 0.87 07/15/2018 1111   CALCIUM 9.0 12/23/2019 1050   PROT 7.1 12/23/2019 1050   ALBUMIN 4.4 12/23/2019 1050   AST 40 12/23/2019 1050   ALT 38 12/23/2019 1050   ALKPHOS 118 12/23/2019 1050   BILITOT 0.4 12/23/2019 1050   GFRNONAA >60 12/23/2019 1050   GFRNONAA 109 07/15/2018 1111   GFRAA >60 12/23/2019 1050   GFRAA 127 07/15/2018 1111    Diabetic Labs (most recent): Lab Results  Component Value Date   HGBA1C 11.0 (H) 12/23/2019   HGBA1C 9.4 (H) 07/15/2018   HGBA1C 10.4 03/07/2018     Lab Results  Component Value Date   TSH 181.194 (H) 12/23/2019   TSH 14.02 (H) 07/15/2018   TSH 134.12 (H) 08/11/2015   TSH 32.191 (H) 10/30/2011   FREET4 0.37 (L) 12/23/2019   FREET4 1.3 07/15/2018   FREET4 0.45 (L) 11/01/2011    Lipid Panel     Component Value Date/Time   CHOL 174 12/23/2019 1050   TRIG 72 12/23/2019 1050   HDL 54 12/23/2019 1050   CHOLHDL 3.2 12/23/2019 1050   VLDL 14 12/23/2019 1050   LDLCALC 106 (H) 12/23/2019 1050   LDLCALC 63 07/15/2018 1111    Assessment & Plan:   1. Uncontrolled  type 1 diabetes mellitus with hyperglycemia (HCC) - Kevin Canales. has currently uncontrolled symptomatic type 1 DM since 39 years of age.  Patient presents accompanied by his wife with his logs and his Dexcom showing improving glycemic profile overall.  He does have frequent fasting and  postprandial hypoglycemia noted.  He reports that his symptoms have improved since last visit but not completely resolved.  -his diabetes is complicated by peripheral neuropathy, tobacco use/abuse and he remains at a high risk for more acute and chronic complications which include CAD, CVA, CKD, retinopathy, and neuropathy. These are all discussed in detail with him.  - I have counseled him on diet management by adopting a carbohydrate restricted/protein rich diet.  - The patient admits there is a room for improvement in their diet and drink choices. -  Suggestion is made for the patient to avoid simple carbohydrates from their diet including Cakes, Sweet Desserts / Pastries, Ice Cream, Soda (diet and regular), Sweet Tea, Candies, Chips, Cookies, Sweet Pastries,  Store Bought Juices, Alcohol in Excess of  1-2 drinks a day, Artificial Sweeteners, Coffee Creamer, and "Sugar-free" Products. This will help patient to have stable blood glucose profile and potentially avoid unintended weight gain.   - I encouraged the patient to switch to  unprocessed or minimally processed complex starch and increased protein intake (animal or plant source), fruits, and vegetables.   - Patient is advised to stick to a routine mealtimes to eat 3 meals  a day and avoid unnecessary snacks ( to snack only to correct hypoglycemia).  - he  Has been scheduled with Norm SaltPenny Crumpton, RDN, CDE for individualized diabetes education previously and will meet with her again today.  - I have approached him with the following individualized plan to manage diabetes and patient agrees:   -He will continue to require intensive treatment with basal/bolus  insulin in order for him to achieve and maintain control of diabetes to target.  Since last visit, he has engaged properly and started monitoring blood glucose 4 times a day and injecting insulin as prescribed.  He has also changed his diet, based on the recommendations of Norm Saltenny Crumpton, RDE.  -Given his frequent fasting and postprandial hypoglycemia, will decrease his Tresiba to 26 units SQ daily at bedtime, and will decrease his NovoLog insulin to 5-8 units if glucose above 70 and eating.  He is advised to use the patient specific sliding scale to adjust his insulin based off of his blood sugar.  -He is advised to continue monitoring blood glucose at least 4 times a day, before meals and at bedtime using his Dexcom CGM device.  He is also instructed to call the clinic if his blood glucose is less than 70 or greater than 200 for 3 tests in a row.  - he is warned not to take insulin without proper monitoring per orders.  -He is not a candidate for non-insulin medications for diabetes management.  - Patient specific target  A1c;  LDL, HDL, Triglycerides, and  Waist Circumference were discussed in detail.  2) Blood Pressure /Hypertension: His blood pressure is controlled to target.  He is not on any antihypertensives at this time.   3) Lipids/Hyperlipidemia:  His recent lipid panel shows LDL at 106, slightly up from last visit.  He is not currently on any medications for cholesterol.  He will be considered for low-dose statin if subsequent lipid panel shows unchanged or worsening cholesterol.  4)  Weight/Diet:  Body mass index is 21.19 kg/m.  He is not a candidate for weight loss.  CDE Consult is in process. Exercise, and detailed carbohydrates information provided  -  detailed on discharge instructions.  5) Hypothyroidism His previsit thyroid function studies from last visit showed a TSH of 181 and a free T4  of 0.37.  He was given prescription for levothyroxine 100 mcg p.o. daily before  breakfast.  He reports he has been taking this medication as prescribed.     - We discussed about the correct intake of his thyroid hormone, on empty stomach at fasting, with water, separated by at least 30 minutes from breakfast and other medications,  and separated by more than 4 hours from calcium, iron, multivitamins, acid reflux medications (PPIs). -Patient is made aware of the fact that thyroid hormone replacement is needed for life, dose to be adjusted by periodic monitoring of thyroid function tests.   6) Chronic Care/Health Maintenance:  -he  is not on ACEI/ARB or Statin medications and  is encouraged to initiate and continue to follow up with Ophthalmology, Dentist,  Podiatrist at least yearly or according to recommendations, and advised to quit tobacco use/abuse. I have recommended yearly flu vaccine and pneumonia vaccine at least every 5 years; moderate intensity exercise for up to 150 minutes weekly; and  sleep for at least 7 hours a day.  - he is  advised to maintain close follow up with Jacquelin Hawking, PA-C for primary care needs, as well as his other providers for optimal and coordinated care.  -He recently tried to reach out to Medicaid again to see if he would qualify.  He reports that he does not qualify because his wife works.  He previously was obtaining his prescriptions from a local pharmacy, however he says that he can get his medications for free if he goes to the free clinic.  Written prescription given to patient for his insulin to take to the free clinic when he goes to see them next week.  - Time spent on this patient care encounter:  35 min, of which > 50% was spent in  counseling and the rest reviewing his blood glucose logs , discussing his hypoglycemia and hyperglycemia episodes, reviewing his current and  previous labs / studies  ( including abstraction from other facilities) and medications  doses and developing a  long term treatment plan and documenting his  care.   Please refer to Patient Instructions for Blood Glucose Monitoring and Insulin/Medications Dosing Guide"  in media tab for additional information. Please  also refer to " Patient Self Inventory" in the Media  tab for reviewed elements of pertinent patient history.  Kevin Canales. participated in the discussions, expressed understanding, and voiced agreement with the above plans.  All questions were answered to his satisfaction. he is encouraged to contact clinic should he have any questions or concerns prior to his return visit.     Follow up plan: - Return in about 3 months (around 04/08/2020) for Diabetes follow up with A1c in office, Bring glucometer and logs, Previsit labs.  Ronny Bacon, FNP-BC [facility] Phone: [phone] Fax: [phone]   01/07/2020, 2:33 PM  This note was partially dictated with voice recognition software. Similar sounding words can be transcribed inadequately or may not  be corrected upon review.

## 2020-01-12 ENCOUNTER — Encounter: Payer: Self-pay | Admitting: Physician Assistant

## 2020-01-12 ENCOUNTER — Ambulatory Visit: Payer: Medicaid Other | Admitting: Physician Assistant

## 2020-01-12 DIAGNOSIS — E1040 Type 1 diabetes mellitus with diabetic neuropathy, unspecified: Secondary | ICD-10-CM

## 2020-01-12 DIAGNOSIS — F489 Nonpsychotic mental disorder, unspecified: Secondary | ICD-10-CM

## 2020-01-12 DIAGNOSIS — E039 Hypothyroidism, unspecified: Secondary | ICD-10-CM

## 2020-01-12 DIAGNOSIS — E1065 Type 1 diabetes mellitus with hyperglycemia: Secondary | ICD-10-CM

## 2020-01-12 MED ORDER — ATORVASTATIN CALCIUM 10 MG PO TABS
10.0000 mg | ORAL_TABLET | Freq: Every day | ORAL | 1 refills | Status: DC
Start: 2020-01-12 — End: 2020-07-07

## 2020-01-12 MED ORDER — GABAPENTIN 300 MG PO CAPS: 300.0000 mg | ORAL_CAPSULE | Freq: Two times a day (BID) | ORAL | 0 refills | Status: DC

## 2020-01-12 NOTE — Progress Notes (Signed)
There were no vitals taken for this visit.   Subjective:    Patient ID: Kevin Cooper., male    DOB: 02/07/1981, 39 y.o.   MRN: [id number]  HPI: Kevin Cooper. is a 39 y.o. male presenting on 01/12/2020 for No chief complaint on file.   HPI   This is a telemedicine appointment through [employer] due to coronavirus pandemic.  I connected with  Jasiel Belisle. on 01/12/20 by a video enabled telemedicine application and verified that I am speaking with the correct person using two identifiers.   I discussed the limitations of evaluation and management by telemedicine. The patient expressed understanding and agreed to proceed.  Pt is at home.  Provider is at office.   Pt is 39yoM with DM and hypothyroidism.  He is being followed by endocrinology for the DM and hypothyroidism.    He has not yet gotten covid vaccination.    Relevant past medical, surgical, family and social history reviewed and updated as indicated. Interim medical history since our last visit reviewed. Allergies and medications reviewed and updated.   Current Outpatient Medications:  .  FLUoxetine (PROZAC) 20 MG capsule, Take 20 mg by mouth at bedtime., Disp: , Rfl:  .  gabapentin (NEURONTIN) 300 MG capsule, Take 300 mg by mouth 2 (two) times daily., Disp: , Rfl:  .  insulin aspart (NOVOLOG) 100 UNIT/ML injection, Inject 5-8 Units into the skin 3 (three) times daily with meals., Disp: 30 mL, Rfl: 0 .  Insulin Degludec (TRESIBA) 100 UNIT/ML SOLN, Inject 26 Units into the skin at bedtime., Disp: 30 mL, Rfl: 3 .  levothyroxine (SYNTHROID) 100 MCG tablet, Take 1 tablet (100 mcg total) by mouth daily before breakfast., Disp: 90 tablet, Rfl: 2     Review of Systems  Per HPI unless specifically indicated above     Objective:    There were no vitals taken for this visit.  Wt Readings from Last 3 Encounters:  01/07/20 124 lb 6.4 oz (56.4 kg)  01/07/20 124 lb 6.4 oz (56.4 kg)  12/28/19 124 lb 9.6 oz (56.5 kg)     Physical Exam Constitutional:      General: He is not in acute distress.    Appearance: He is not ill-appearing.  HENT:     Head: Normocephalic and atraumatic.  Pulmonary:     Effort: No respiratory distress.  Neurological:     Mental Status: He is alert and oriented to person, place, and time.  Psychiatric:        Behavior: Behavior normal.     Results for orders placed or performed during the hospital encounter of 12/23/19  Lipid panel  Result Value Ref Range   Cholesterol 174 0 - 200 mg/dL   Triglycerides 72 <400 mg/dL   HDL 54 >86 mg/dL   Total CHOL/HDL Ratio 3.2 RATIO   VLDL 14 0 - 40 mg/dL   LDL Cholesterol 761 (H) 0 - 99 mg/dL  T4, free  Result Value Ref Range   Free T4 0.37 (L) 0.61 - 1.12 ng/dL  TSH  Result Value Ref Range   TSH 181.194 (H) 0.350 - 4.500 uIU/mL  CBC  Result Value Ref Range   WBC 6.0 4.0 - 10.5 K/uL   RBC 4.27 4.22 - 5.81 MIL/uL   Hemoglobin 12.9 (L) 13.0 - 17.0 g/dL   HCT 95.0 (L) 39 - 52 %   MCV 89.7 80.0 - 100.0 fL   MCH 30.2 26.0 - 34.0 pg  MCHC 33.7 30.0 - 36.0 g/dL   RDW 10.1 75.1 - 02.5 %   Platelets 195 150 - 400 K/uL   nRBC 0.0 0.0 - 0.2 %  Hemoglobin A1c  Result Value Ref Range   Hgb A1c MFr Bld 11.0 (H) 4.8 - 5.6 %   Mean Plasma Glucose 269 mg/dL  Comprehensive metabolic panel  Result Value Ref Range   Sodium 134 (L) 135 - 145 mmol/L   Potassium 3.7 3.5 - 5.1 mmol/L   Chloride 96 (L) 98 - 111 mmol/L   CO2 29 22 - 32 mmol/L   Glucose, Bld 242 (H) 70 - 99 mg/dL   BUN 7 6 - 20 mg/dL   Creatinine, Ser 8.52 0.61 - 1.24 mg/dL   Calcium 9.0 8.9 - 77.8 mg/dL   Total Protein 7.1 6.5 - 8.1 g/dL   Albumin 4.4 3.5 - 5.0 g/dL   AST 40 15 - 41 U/L   ALT 38 0 - 44 U/L   Alkaline Phosphatase 118 38 - 126 U/L   Total Bilirubin 0.4 0.3 - 1.2 mg/dL   GFR calc non Af Amer >60 >60 mL/min   GFR calc Af Amer >60 >60 mL/min   Anion gap 9 5 - 15  Microalbumin, urine  Result Value Ref Range   Microalb, Ur 7.3 (H) Not Estab. ug/mL       Assessment & Plan:    Encounter Diagnoses  Name Primary?  Marland Kitchen Uncontrolled type 1 diabetes mellitus with hyperglycemia (HCC) Yes  . Hypothyroidism, unspecified type   . Mental health problem   . Type 1 diabetes mellitus with diabetic neuropathy, unspecified (HCC)       -reviewed labs with pt -DM and thyroid being managed by endocrinology -add atorvastatin -continue gabapentin for neuropathy -pt encouraged to get covid vaccination -pt to follow up 3 months.  He is to contact office sooner prn

## 2020-01-18 ENCOUNTER — Encounter: Payer: Self-pay | Admitting: Nutrition

## 2020-01-18 NOTE — Patient Instructions (Signed)
Goals  Follow My Plate Eat 3-4 carb choices per meal Increase water intake  6 water bottles per day Cut out sugar free beverages Eat meals on time Do not skip meals Test blood sugars 4 times per day Record blood sugars on sheets Dont' take insulin if not eating a meal. Prevent hypoglycemia.

## 2020-02-02 ENCOUNTER — Other Ambulatory Visit: Payer: Self-pay | Admitting: Physician Assistant

## 2020-02-02 DIAGNOSIS — E039 Hypothyroidism, unspecified: Secondary | ICD-10-CM

## 2020-02-02 MED ORDER — LEVOTHYROXINE SODIUM 100 MCG PO TABS: 100.0000 ug | ORAL_TABLET | Freq: Every day | ORAL | 0 refills | Status: DC

## 2020-02-03 ENCOUNTER — Other Ambulatory Visit: Payer: Self-pay | Admitting: Physician Assistant

## 2020-02-03 DIAGNOSIS — E039 Hypothyroidism, unspecified: Secondary | ICD-10-CM

## 2020-02-03 MED ORDER — LEVOTHYROXINE SODIUM 100 MCG PO TABS: 100.0000 ug | ORAL_TABLET | Freq: Every day | ORAL | 0 refills | Status: DC

## 2020-03-29 ENCOUNTER — Other Ambulatory Visit: Payer: Self-pay | Admitting: Physician Assistant

## 2020-03-29 DIAGNOSIS — E1065 Type 1 diabetes mellitus with hyperglycemia: Secondary | ICD-10-CM

## 2020-04-11 ENCOUNTER — Ambulatory Visit: Payer: Medicaid Other | Admitting: Physician Assistant

## 2020-04-12 ENCOUNTER — Encounter: Payer: Self-pay | Admitting: Student

## 2020-04-12 ENCOUNTER — Ambulatory Visit: Payer: Medicaid Other | Admitting: Nutrition

## 2020-04-12 ENCOUNTER — Ambulatory Visit: Payer: Medicaid Other | Admitting: Nurse Practitioner

## 2020-05-25 ENCOUNTER — Other Ambulatory Visit: Payer: Self-pay

## 2020-05-25 ENCOUNTER — Other Ambulatory Visit (HOSPITAL_COMMUNITY)
Admission: RE | Admit: 2020-05-25 | Discharge: 2020-05-25 | Disposition: A | Discharge status: Home / Self Care | Payer: Medicaid Other | Source: Ambulatory Visit | Attending: Physician Assistant | Admitting: Physician Assistant

## 2020-05-25 DIAGNOSIS — E1065 Type 1 diabetes mellitus with hyperglycemia: Secondary | ICD-10-CM | POA: Insufficient documentation

## 2020-05-25 LAB — COMPREHENSIVE METABOLIC PANEL
ALT: 57 U/L — ABNORMAL HIGH (ref 0–44)
AST: 41 U/L (ref 15–41)
Albumin: 4.3 g/dL (ref 3.5–5.0)
Alkaline Phosphatase: 145 U/L — ABNORMAL HIGH (ref 38–126)
Anion gap: 7 (ref 5–15)
BUN: 12 mg/dL (ref 6–20)
CO2: 28 mmol/L (ref 22–32)
Calcium: 9 mg/dL (ref 8.9–10.3)
Chloride: 99 mmol/L (ref 98–111)
Creatinine, Ser: 0.79 mg/dL (ref 0.61–1.24)
GFR, Estimated: >60 mL/min (ref >60)
Glucose, Bld: 177 mg/dL — ABNORMAL HIGH (ref 70–99)
Potassium: 3.7 mmol/L (ref 3.5–5.1)
Sodium: 134 mmol/L — ABNORMAL LOW (ref 135–145)
Total Bilirubin: 0.7 mg/dL (ref 0.3–1.2)
Total Protein: 7.2 g/dL (ref 6.5–8.1)

## 2020-05-25 LAB — LIPID PANEL
Cholesterol: 183 mg/dL (ref 0–200)
HDL: 63 mg/dL (ref >40)
LDL Cholesterol: 108 mg/dL — ABNORMAL HIGH (ref 0–99)
Total CHOL/HDL Ratio: 2.9 RATIO
Triglycerides: 60 mg/dL (ref <150)
VLDL: 12 mg/dL (ref 0–40)

## 2020-07-04 ENCOUNTER — Ambulatory Visit: Payer: Medicaid Other | Admitting: Physician Assistant

## 2020-07-07 ENCOUNTER — Telehealth: Payer: Self-pay | Admitting: Nurse Practitioner

## 2020-07-07 ENCOUNTER — Other Ambulatory Visit: Payer: Self-pay

## 2020-07-07 ENCOUNTER — Encounter: Payer: Self-pay | Admitting: Physician Assistant

## 2020-07-07 ENCOUNTER — Ambulatory Visit: Payer: Medicaid Other | Admitting: Physician Assistant

## 2020-07-07 VITALS — BP 110/78 | HR 83 | Temp 98.1°F | Ht 64.25 in | Wt 124.8 lb

## 2020-07-07 DIAGNOSIS — F489 Nonpsychotic mental disorder, unspecified: Secondary | ICD-10-CM

## 2020-07-07 DIAGNOSIS — E039 Hypothyroidism, unspecified: Secondary | ICD-10-CM

## 2020-07-07 DIAGNOSIS — E1065 Type 1 diabetes mellitus with hyperglycemia: Secondary | ICD-10-CM

## 2020-07-07 DIAGNOSIS — R7989 Other specified abnormal findings of blood chemistry: Secondary | ICD-10-CM

## 2020-07-07 MED ORDER — FLUOXETINE HCL 20 MG PO CAPS: 20.0000 mg | ORAL_CAPSULE | Freq: Every day | ORAL | 0 refills | Status: DC

## 2020-07-07 MED ORDER — ATORVASTATIN CALCIUM 20 MG PO TABS
20.0000 mg | ORAL_TABLET | Freq: Every day | ORAL | 0 refills | Status: AC
Start: 2020-07-07 — End: ?

## 2020-07-07 NOTE — Telephone Encounter (Signed)
done

## 2020-07-07 NOTE — Progress Notes (Signed)
BP 110/78   Pulse 83   Temp 98.1 F (36.7 C)   Ht 5' 4.25" (1.632 m)   Wt 124 lb 12 oz (56.6 kg)   SpO2 99%   BMI 21.25 kg/m    Subjective:    Patient ID: Kevin Canales., male    DOB: 10-10-1980, 40 y.o.   MRN: [id number]  HPI: Kevin Hudock. is a 40 y.o. male presenting on 07/07/2020 for Follow-up   HPI    Pt had a negative covid 19 screening questionnaire.   Pt says he had a cold 3 or 4 weeks ago.  He doesn't go to [facility].  He says prozac was started by [facility] in about june 2021.   He says He was put on it for feeling down and out.  zoloft didn't work well for him.  prozac is helping him.  He skipped it a night of two and didn't notice the differnce.    He is long past due for endocrinolgoy appointment.  He cancelled his most recent appointment there so it's been over 6 months since he was seen.    He says he doesn't take APAP.  He drinks etoh once every 2 or 3 months or less frequently..    He buys a mutli-vitamin when he has the money but doesn't take it all the time due to cost.  He does not use any herbal products.  He has No history of  IVDU  He got covid vaccine x 2 .  He is Not yet boosted.  He doesn't have his card with him today.    Relevant past medical, surgical, family and social history reviewed and updated as indicated. Interim medical history since our last visit reviewed. Allergies and medications reviewed and updated.   Current Outpatient Medications:  .  atorvastatin (LIPITOR) 10 MG tablet, Take 1 tablet (10 mg total) by mouth daily., Disp: 90 tablet, Rfl: 1 .  FLUoxetine (PROZAC) 20 MG capsule, Take 20 mg by mouth at bedtime., Disp: , Rfl:  .  gabapentin (NEURONTIN) 300 MG capsule, Take 1 capsule (300 mg total) by mouth 2 (two) times daily., Disp: 180 capsule, Rfl: 0 .  insulin aspart (NOVOLOG) 100 UNIT/ML injection, Inject 5-8 Units into the skin 3 (three) times daily with meals., Disp: 30 mL, Rfl: 0 .  Insulin Degludec (TRESIBA)  100 UNIT/ML SOLN, Inject 26 Units into the skin at bedtime., Disp: 30 mL, Rfl: 3 .  levothyroxine (SYNTHROID) 100 MCG tablet, Take 1 tablet (100 mcg total) by mouth daily before breakfast., Disp: 90 tablet, Rfl: 0    Review of Systems  Per HPI unless specifically indicated above     Objective:    BP 110/78   Pulse 83   Temp 98.1 F (36.7 C)   Ht 5' 4.25" (1.632 m)   Wt 124 lb 12 oz (56.6 kg)   SpO2 99%   BMI 21.25 kg/m   Wt Readings from Last 3 Encounters:  07/07/20 124 lb 12 oz (56.6 kg)  01/07/20 124 lb 6.4 oz (56.4 kg)  01/07/20 124 lb 6.4 oz (56.4 kg)    Physical Exam Vitals reviewed.  Constitutional:      General: He is not in acute distress.    Appearance: He is well-developed and well-nourished. He is not toxic-appearing.  HENT:     Head: Normocephalic and atraumatic.  Cardiovascular:     Rate and Rhythm: Normal rate and regular rhythm.  Pulmonary:  Effort: Pulmonary effort is normal.     Breath sounds: Normal breath sounds. No wheezing.  Abdominal:     General: Bowel sounds are normal.     Palpations: Abdomen is soft. There is no hepatosplenomegaly.     Tenderness: There is no abdominal tenderness.  Musculoskeletal:        General: No edema.     Cervical back: Neck supple.     Right lower leg: No edema.     Left lower leg: No edema.  Lymphadenopathy:     Cervical: No cervical adenopathy.  Skin:    General: Skin is warm and dry.  Neurological:     Mental Status: He is alert and oriented to person, place, and time.  Psychiatric:        Attention and Perception: Attention normal.        Mood and Affect: Mood and affect normal.        Speech: Speech normal.        Behavior: Behavior normal. Behavior is cooperative.     Comments: Converses easily     Results for orders placed or performed during the hospital encounter of 05/25/20  Lipid panel  Result Value Ref Range   Cholesterol 183 0 - 200 mg/dL   Triglycerides 60 <106 mg/dL   HDL 63 >26  mg/dL   Total CHOL/HDL Ratio 2.9 RATIO   VLDL 12 0 - 40 mg/dL   LDL Cholesterol 948 (H) 0 - 99 mg/dL  Comprehensive metabolic panel  Result Value Ref Range   Sodium 134 (L) 135 - 145 mmol/L   Potassium 3.7 3.5 - 5.1 mmol/L   Chloride 99 98 - 111 mmol/L   CO2 28 22 - 32 mmol/L   Glucose, Bld 177 (H) 70 - 99 mg/dL   BUN 12 6 - 20 mg/dL   Creatinine, Ser 5.46 0.61 - 1.24 mg/dL   Calcium 9.0 8.9 - 27.0 mg/dL   Total Protein 7.2 6.5 - 8.1 g/dL   Albumin 4.3 3.5 - 5.0 g/dL   AST 41 15 - 41 U/L   ALT 57 (H) 0 - 44 U/L   Alkaline Phosphatase 145 (H) 38 - 126 U/L   Total Bilirubin 0.7 0.3 - 1.2 mg/dL   GFR, Estimated >35 >00 mL/min   Anion gap 7 5 - 15      Assessment & Plan:    Encounter Diagnoses  Name Primary?  Marland Kitchen Uncontrolled type 1 diabetes mellitus with hyperglycemia (HCC) Yes  . Elevated LFTs   . Mental health problem      -reviewed labs with pt -Pt is given cafa/application for [employer] financial assistance -Increase lipitor to 20mg  -pt urged to Get to endocrinology.  He is to call to get appointment -discussed LFTs.  Will monitor.  He is encouraged to avoid APAP. -rx prozac [facility] -pt is On list for [facility] eye exam -pt to follow up here 3 months.  He is to contact office sooner prn

## 2020-07-07 NOTE — Telephone Encounter (Signed)
Patient needs new lab orders sent to [facility]/

## 2020-07-20 LAB — T4, FREE: Free T4: 0.23 ng/dL — ABNORMAL LOW (ref 0.82–1.77)

## 2020-07-20 LAB — TSH: TSH: 263 u[IU]/mL — ABNORMAL HIGH (ref 0.450–4.500)

## 2020-07-25 ENCOUNTER — Encounter: Payer: Self-pay | Admitting: Nurse Practitioner

## 2020-07-25 ENCOUNTER — Other Ambulatory Visit: Payer: Self-pay

## 2020-07-25 ENCOUNTER — Ambulatory Visit (INDEPENDENT_AMBULATORY_CARE_PROVIDER_SITE_OTHER): Payer: Self-pay | Admitting: Nurse Practitioner

## 2020-07-25 ENCOUNTER — Telehealth: Payer: Self-pay | Admitting: Nurse Practitioner

## 2020-07-25 VITALS — BP 145/88 | HR 65 | Ht 64.25 in | Wt 131.8 lb

## 2020-07-25 DIAGNOSIS — E039 Hypothyroidism, unspecified: Secondary | ICD-10-CM

## 2020-07-25 DIAGNOSIS — E1065 Type 1 diabetes mellitus with hyperglycemia: Secondary | ICD-10-CM

## 2020-07-25 LAB — POCT GLYCOSYLATED HEMOGLOBIN (HGB A1C): HbA1c, POC (controlled diabetic range): 9.7 % — AB (ref 0.0–7.0)

## 2020-07-25 MED ORDER — TRESIBA 100 UNIT/ML ~~LOC~~ SOLN: 24.0000 [IU] | Freq: Every evening | SUBCUTANEOUS | 3 refills | Status: DC

## 2020-07-25 MED ORDER — INSULIN ASPART 100 UNIT/ML ~~LOC~~ SOLN
2.0000 [IU] | Freq: Three times a day (TID) | SUBCUTANEOUS | 1 refills | Status: DC
Start: 2020-07-25 — End: 2021-08-28

## 2020-07-25 MED ORDER — INSULIN ASPART 100 UNIT/ML ~~LOC~~ SOLN: 2.0000 [IU] | Freq: Three times a day (TID) | SUBCUTANEOUS | 1 refills | Status: DC

## 2020-07-25 MED ORDER — LEVOTHYROXINE SODIUM 100 MCG PO TABS: 100.0000 ug | ORAL_TABLET | Freq: Every day | ORAL | 0 refills | Status: DC

## 2020-07-25 NOTE — Patient Instructions (Signed)

## 2020-07-25 NOTE — Telephone Encounter (Signed)
Jerrel Ivory called from [facility] and states she canceled these prescriptions because this pharmacy is in [place]. Please advise.

## 2020-07-25 NOTE — Progress Notes (Signed)
Endocrinology follow-up note       07/25/2020, 9:15 AM   Subjective:    Patient ID: Kevin Castorena., male    DOB: 1981-04-15.  Kevin Tsuda. is being seen in follow-up for management of currently uncontrolled symptomatic type 1 diabetes, hypothyroidism. PMD:   Kevin Hawking, PA-C.   Past Medical History:  Diagnosis Date  . Chronic right hip pain   . Depression   . Diabetes mellitus    diagnosed age 40  . Seizures (HCC)    last one january 2016 due to low bs  . Thyroid disease    Past Surgical History:  Procedure Laterality Date  . EYE SURGERY Right age 69   Social History   Socioeconomic History  . Marital status: Legally Separated    Spouse name: Not on file  . Number of children: Not on file  . Years of education: Not on file  . Highest education level: Not on file  Occupational History  . Not on file  Tobacco Use  . Smoking status: Former Smoker    Packs/day: 2.00    Years: 4.00    Pack years: 8.00    Types: Cigarettes    Quit date: 05/29/2003    Years since quitting: 17.1  . Smokeless tobacco: Current User    Types: Chew  Vaping Use  . Vaping Use: Never used  Substance and Sexual Activity  . Alcohol use: No    Comment: occassional rare beer or liqour  . Drug use: No  . Sexual activity: Yes    Birth control/protection: Condom  Other Topics Concern  . Not on file  Social History Narrative  . Not on file   Social Determinants of Health   Financial Resource Strain: Not on file  Food Insecurity: Not on file  Transportation Needs: Not on file  Physical Activity: Not on file  Stress: Not on file  Social Connections: Not on file   Outpatient Encounter Medications as of 07/25/2020  Medication Sig  . atorvastatin (LIPITOR) 20 MG tablet Take 1 tablet (20 mg total) by mouth daily.  Marland Kitchen FLUoxetine (PROZAC) 20 MG capsule Take 1 capsule (20 mg total) by mouth at bedtime.  .  [DISCONTINUED] insulin aspart (NOVOLOG) 100 UNIT/ML injection Inject 5-8 Units into the skin 3 (three) times daily with meals.  . [DISCONTINUED] Insulin Degludec (TRESIBA) 100 UNIT/ML SOLN Inject 26 Units into the skin at bedtime.  . [DISCONTINUED] levothyroxine (SYNTHROID) 100 MCG tablet Take 1 tablet (100 mcg total) by mouth daily before breakfast.  . gabapentin (NEURONTIN) 300 MG capsule Take 1 capsule (300 mg total) by mouth 2 (two) times daily. (Patient not taking: Reported on 07/25/2020)  . insulin aspart (NOVOLOG) 100 UNIT/ML injection Inject 2-5 Units into the skin 3 (three) times daily with meals.  . Insulin Degludec (TRESIBA) 100 UNIT/ML SOLN Inject 24 Units into the skin at bedtime.  Marland Kitchen levothyroxine (SYNTHROID) 100 MCG tablet Take 1 tablet (100 mcg total) by mouth daily before breakfast.   No facility-administered encounter medications on file as of 07/25/2020.    ALLERGIES: Allergies  Allergen Reactions  . Tomato Hives, Itching and  Rash  . Penicillins Other (See Comments)    UNKNOWN REACTION    VACCINATION STATUS:  There is no immunization history on file for this patient.  Diabetes He presents for his follow-up diabetic visit. He has type 1 diabetes mellitus. Onset time: He was diagnosed at approximate age of 8 years. His disease course has been improving. Hypoglycemia symptoms include hunger and sweats. Pertinent negatives for hypoglycemia include no confusion, dizziness, nervousness/anxiousness or pallor. Associated symptoms include fatigue. Pertinent negatives for diabetes include no chest pain, no polydipsia, no polyphagia, no polyuria, no weakness and no weight loss. Hypoglycemia complications include nocturnal hypoglycemia. (He reports that his wife will hear his Dexcom alert and wake him up in the middle of the night for low blood sugars.) Symptoms are improving. Diabetic complications include impotence and peripheral neuropathy. Risk factors for coronary artery disease  include diabetes mellitus, tobacco exposure, sedentary lifestyle, family history, male sex and dyslipidemia. Current diabetic treatment includes insulin injections. He is compliant with treatment some of the time. His weight is increasing steadily. He is following a generally unhealthy diet. Meal planning includes carbohydrate counting and ADA exchanges. He has not had a previous visit with a dietitian. He rarely participates in exercise. His home blood glucose trend is decreasing steadily. (He presents today, accompanied by his wife, with no meter or logs to review.  He could not afford to get replacement sensors for the Dexcom and has not been able to afford strips even from the free clinic.  His POCT A1c today is 9.7%, improving from previous visit of 11%.  He has not been taking his Novolog as prescribed due to inability to monitor glucose.  He does report frequent symptoms of hypoglycemia.  He is in the process of appealing to a judge to get disability which will help him greatly with his diabetes management.) An ACE inhibitor/angiotensin II receptor blocker is not being taken. He does not see a podiatrist.Eye exam is current.  Thyroid Problem Presents for follow-up visit. Symptoms include diarrhea and fatigue. Patient reports no anxiety, cold intolerance, constipation, heat intolerance, palpitations, weight gain or weight loss. The symptoms have been stable. Past treatments include levothyroxine. The treatment provided mild relief. His past medical history is significant for diabetes and hyperlipidemia. Risk factors include family history of hypothyroidism.  Erectile Dysfunction This is a chronic problem. The current episode started more than 1 year ago. The problem has been gradually worsening since onset. The nature of his difficulty is achieving erection and maintaining erection. He reports no anxiety or decreased libido. Irritative symptoms do not include urgency. Pertinent negatives include no chills,  dysuria or hematuria. Past treatments include nothing. Risk factors include diabetes mellitus, penile trauma and tobacco use (was stepped on by a horse as a teenager in the groin causing trauma).  Hyperlipidemia This is a chronic problem. The current episode started more than 1 year ago. The problem is uncontrolled. Recent lipid tests were reviewed and are high. Exacerbating diseases include diabetes and hypothyroidism. There are no known factors aggravating his hyperlipidemia. Pertinent negatives include no chest pain or myalgias. Current antihyperlipidemic treatment includes statins. There are no compliance problems.  Risk factors for coronary artery disease include diabetes mellitus, dyslipidemia, male sex and a sedentary lifestyle.      Review of Systems  Constitutional: Positive for fatigue. Negative for chills, unexpected weight change, weight gain and weight loss.  HENT: Negative for dental problem and trouble swallowing.   Eyes: Negative for visual disturbance.  Respiratory: Negative  for choking, chest tightness and wheezing.   Cardiovascular: Negative for chest pain, palpitations and leg swelling.  Gastrointestinal: Positive for diarrhea and vomiting. Negative for abdominal distention, constipation and nausea.       Intermittent nocturnal fecal incontinence  Endocrine: Negative for cold intolerance, heat intolerance, polydipsia, polyphagia and polyuria.  Genitourinary: Positive for impotence. Negative for decreased libido, dysuria, flank pain, hematuria and urgency.       Difficulty achieving and maintaining an erection  Musculoskeletal: Positive for arthralgias. Negative for back pain, gait problem, myalgias and neck pain.       In shoulders and hips  Skin: Negative for pallor, rash and wound.  Neurological: Positive for numbness. Negative for dizziness and weakness.       And burning sensation in bilateral feet  Psychiatric/Behavioral: Negative for confusion and dysphoric mood. The  patient is not nervous/anxious.     Objective:    BP (!) 145/88 (BP Location: Left Arm)   Pulse 65   Ht 5' 4.25" (1.632 m)   Wt 131 lb 12.8 oz (59.8 kg)   BMI 22.45 kg/m   Wt Readings from Last 3 Encounters:  07/25/20 131 lb 12.8 oz (59.8 kg)  07/07/20 124 lb 12 oz (56.6 kg)  01/07/20 124 lb 6.4 oz (56.4 kg)    BP Readings from Last 3 Encounters:  07/25/20 (!) 145/88  07/07/20 110/78  01/07/20 114/75    Physical Exam- Limited  Constitutional:  Body mass index is 22.45 kg/m. , not in acute distress, appears less anxious this visit Eyes:  EOMI, no exophthalmos Neck: Supple Thyroid: No gross goiter Cardiovascular: RRR, no murmers, rubs, or gallops, no edema Respiratory: Adequate breathing efforts, no crackles, rales, rhonchi, or wheezing Musculoskeletal: no gross deformities, strength intact in all four extremities, no gross restriction of joint movements, walks with cane Skin:  no rashes, no hyperemia, extensive tattoos Neurological: no tremor with outstretched hands    POCT ABI Results 07/25/20   Right ABI:  1.11      Left ABI:  1.14  Right leg systolic / diastolic: 161/92 mmHg Left leg systolic / diastolic: 165/85 mmHg  Arm systolic / diastolic: 145/88 mmHG  Detailed report will be scanned into patient chart.   CMP ( most recent) CMP     Component Value Date/Time   NA 134 (L) 05/25/2020 0849   K 3.7 05/25/2020 0849   CL 99 05/25/2020 0849   CO2 28 05/25/2020 0849   GLUCOSE 177 (H) 05/25/2020 0849   BUN 12 05/25/2020 0849   CREATININE 0.79 05/25/2020 0849   CREATININE 0.87 07/15/2018 1111   CALCIUM 9.0 05/25/2020 0849   PROT 7.2 05/25/2020 0849   ALBUMIN 4.3 05/25/2020 0849   AST 41 05/25/2020 0849   ALT 57 (H) 05/25/2020 0849   ALKPHOS 145 (H) 05/25/2020 0849   BILITOT 0.7 05/25/2020 0849   GFRNONAA >60 05/25/2020 0849   GFRNONAA 109 07/15/2018 1111   GFRAA >60 12/23/2019 1050   GFRAA 127 07/15/2018 1111    Diabetic Labs (most recent): Lab  Results  Component Value Date   HGBA1C 9.7 (A) 07/25/2020   HGBA1C 11.0 (H) 12/23/2019   HGBA1C 9.4 (H) 07/15/2018     Lab Results  Component Value Date   TSH 263.000 (H) 07/19/2020   TSH 181.194 (H) 12/23/2019   TSH 14.02 (H) 07/15/2018   TSH 134.12 (H) 08/11/2015   TSH 32.191 (H) 10/30/2011   FREET4 0.23 (L) 07/19/2020   FREET4 0.37 (L) 12/23/2019   FREET4  1.3 07/15/2018   FREET4 0.45 (L) 11/01/2011    Lipid Panel     Component Value Date/Time   CHOL 183 05/25/2020 0849   TRIG 60 05/25/2020 0849   HDL 63 05/25/2020 0849   CHOLHDL 2.9 05/25/2020 0849   VLDL 12 05/25/2020 0849   LDLCALC 108 (H) 05/25/2020 0849   LDLCALC 63 07/15/2018 1111    Assessment & Plan:   1) Uncontrolled type 1 diabetes mellitus with hyperglycemia (HCC) - Kevin Canales. has currently uncontrolled symptomatic type 1 DM since 40 years of age.  He presents today, accompanied by his wife, with no meter or logs to review.  He could not afford to get replacement sensors for the Dexcom and has not been able to afford strips even from the free clinic.  His POCT A1c today is 9.7%, improving from previous visit of 11%.  He has not been taking his Novolog as prescribed due to inability to monitor glucose.  He does report frequent symptoms of hypoglycemia.  He is in the process of appealing to a judge to get disability which will help him greatly with his diabetes management.  -his diabetes is complicated by peripheral neuropathy, tobacco use/abuse and he remains at a high risk for more acute and chronic complications which include CAD, CVA, CKD, retinopathy, and neuropathy. These are all discussed in detail with him.  - Nutritional counseling repeated at each appointment due to patients tendency to fall back in to old habits.  - The patient admits there is a room for improvement in their diet and drink choices. -  Suggestion is made for the patient to avoid simple carbohydrates from their diet including Cakes,  Sweet Desserts / Pastries, Ice Cream, Soda (diet and regular), Sweet Tea, Candies, Chips, Cookies, Sweet Pastries,  Store Bought Juices, Alcohol in Excess of  1-2 drinks a day, Artificial Sweeteners, Coffee Creamer, and "Sugar-free" Products. This will help patient to have stable blood glucose profile and potentially avoid unintended weight gain.   - I encouraged the patient to switch to  unprocessed or minimally processed complex starch and increased protein intake (animal or plant source), fruits, and vegetables.   - Patient is advised to stick to a routine mealtimes to eat 3 meals  a day and avoid unnecessary snacks ( to snack only to correct hypoglycemia).  - he has been scheduled with Norm Salt, RDN, CDE for individualized diabetes education previously.  - I have approached him with the following individualized plan to manage diabetes and patient agrees:   -He will continue to require intensive treatment with basal/bolus insulin in order for him to achieve and maintain control of diabetes to target.   -Given his report of frequent fasting and postprandial hypoglycemia symptoms, will decrease his Kevin Cooper further to to 24 units SQ daily at bedtime, and will decrease his NovoLog insulin to 2-5 units if glucose above 70 and eating.  He is advised to use the patient specific sliding scale to adjust his insulin based off of his blood sugar.  -He is advised to restart monitoring blood glucose at least 4 times a day, before meals and at bedtime.  He is also instructed to call the clinic if his blood glucose is less than 70 or greater than 200 for 3 tests in a row.  - he is warned not to take insulin without proper monitoring per orders.  -He is not a candidate for non-insulin medications for diabetes management.  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  Would like to trial Creon but he cannot afford it at this time.  Will pursue if he manages to obtain  disability or steady insurance.  - Patient specific target  A1c;  LDL, HDL, Triglycerides, and  Waist Circumference were discussed in detail.  2) Blood Pressure /Hypertension: His blood pressure is not controlled to target.  He is not on any antihypertensives at this time. He will be considered for low dose ACE or ARB if BP is elevated on 3 separate visits.  3) Lipids/Hyperlipidemia:  His recent lipid panel from 12/29/21shows uncontrolled LDL at 106, slightly up from last visit.  He is advised to continue Lipitor 20 mg po daily at bedtime.  Side effects and precautions discussed with him.   4)  Weight/Diet:  His Body mass index is 22.45 kg/m.  He is not a candidate for weight loss.  CDE Consult is in process. Exercise, and detailed carbohydrates information provided  -  detailed on discharge instructions.  5) Hypothyroidism His previsit TFTs are consistent with inadequate hormone replacement.  He has been taking his Levothyroxine at night instead of the morning which may be impacting the medication absorption.  He is advised to continue Levothyroxine 100 mcg po daily during the overnight hours when his stomach is empty since he has frequent fasting hypoglycemia requiring him to correct early in the morning.  Will recheck thyroid function tests prior to next visit.   - We discussed about the correct intake of his thyroid hormone, on empty stomach at fasting, with water, separated by at least 30 minutes from breakfast and other medications,  and separated by more than 4 hours from calcium, iron, multivitamins, acid reflux medications (PPIs). -Patient is made aware of the fact that thyroid hormone replacement is needed for life, dose to be adjusted by periodic monitoring of thyroid function tests.  6) Chronic Care/Health Maintenance: -he is not on ACEI/ARB and is on Statin medications and is encouraged to initiate and continue to follow up with Ophthalmology, Dentist,  Podiatrist at least yearly or  according to recommendations, and advised to quit tobacco use/abuse. I have recommended yearly flu vaccine and pneumonia vaccine at least every 5 years; moderate intensity exercise for up to 150 minutes weekly; and  sleep for at least 7 hours a day.  - he is  advised to maintain close follow up with Kevin Hawking, PA-C for primary care needs, as well as his other providers for optimal and coordinated care.   - Time spent on this patient care encounter:  40 min, of which > 50% was spent in  counseling and the rest reviewing his blood glucose logs , discussing his hypoglycemia and hyperglycemia episodes, reviewing his current and  previous labs / studies  ( including abstraction from other facilities) and medications  doses and developing a  long term treatment plan and documenting his care.   Please refer to Patient Instructions for Blood Glucose Monitoring and Insulin/Medications Dosing Guide"  in media tab for additional information. Please  also refer to " Patient Self Inventory" in the Media  tab for reviewed elements of pertinent patient history.  Kevin Canales. participated in the discussions, expressed understanding, and voiced agreement with the above plans.  All questions were answered to his satisfaction. he is encouraged to contact clinic should he have any questions or concerns prior to his return visit.     Follow up plan: - Return in about 3 months (around 10/22/2020) for  Diabetes follow up- A1c and urine micro in office, Thyroid follow up, Previsit labs.  Ronny BaconWhitney Candance Bohlman, [facility] Cape MayFNP-BC [facility] [address] [place], [place] [address] Phone: [phone] Fax: [phone]   07/25/2020, 9:15 AM

## 2020-07-25 NOTE — Telephone Encounter (Signed)
Resent

## 2020-09-21 ENCOUNTER — Other Ambulatory Visit: Payer: Self-pay | Admitting: Physician Assistant

## 2020-09-21 DIAGNOSIS — E1065 Type 1 diabetes mellitus with hyperglycemia: Secondary | ICD-10-CM

## 2020-09-21 DIAGNOSIS — R7989 Other specified abnormal findings of blood chemistry: Secondary | ICD-10-CM

## 2020-10-05 ENCOUNTER — Ambulatory Visit: Payer: Medicare Other | Admitting: Physician Assistant

## 2020-10-26 ENCOUNTER — Ambulatory Visit: Payer: Medicaid Other | Admitting: Nurse Practitioner

## 2020-10-26 DIAGNOSIS — E1065 Type 1 diabetes mellitus with hyperglycemia: Secondary | ICD-10-CM

## 2020-10-26 DIAGNOSIS — E039 Hypothyroidism, unspecified: Secondary | ICD-10-CM

## 2020-11-22 ENCOUNTER — Ambulatory Visit: Payer: Medicare Other | Admitting: Nurse Practitioner

## 2021-01-17 ENCOUNTER — Telehealth: Payer: Self-pay

## 2021-01-17 NOTE — Telephone Encounter (Signed)
Left a message requesting a return call to the office. 

## 2021-01-17 NOTE — Telephone Encounter (Signed)
Pt said that medicaid will cover a meter for him. He is asking for the nurse to call him in regards to this

## 2021-02-03 LAB — COMPREHENSIVE METABOLIC PANEL
ALT: 19 IU/L (ref 0–44)
AST: 27 IU/L (ref 0–40)
Albumin/Globulin Ratio: 2.5 — ABNORMAL HIGH (ref 1.2–2.2)
Albumin: 5.2 g/dL — ABNORMAL HIGH (ref 4.0–5.0)
Alkaline Phosphatase: 187 IU/L — ABNORMAL HIGH (ref 44–121)
BUN/Creatinine Ratio: 8 — ABNORMAL LOW (ref 9–20)
BUN: 8 mg/dL (ref 6–24)
Bilirubin Total: 0.4 mg/dL (ref 0.0–1.2)
CO2: 23 mmol/L (ref 20–29)
Calcium: 9.7 mg/dL (ref 8.7–10.2)
Chloride: 96 mmol/L (ref 96–106)
Creatinine, Ser: 0.99 mg/dL (ref 0.76–1.27)
Globulin, Total: 2.1 g/dL (ref 1.5–4.5)
Glucose: 428 mg/dL — ABNORMAL HIGH (ref 65–99)
Potassium: 4.4 mmol/L (ref 3.5–5.2)
Sodium: 136 mmol/L (ref 134–144)
Total Protein: 7.3 g/dL (ref 6.0–8.5)
eGFR: 98 mL/min/{1.73_m2} (ref >59)

## 2021-02-03 LAB — TSH: TSH: 298 u[IU]/mL — ABNORMAL HIGH (ref 0.450–4.500)

## 2021-02-03 LAB — T4, FREE: Free T4: 0.21 ng/dL — ABNORMAL LOW (ref 0.82–1.77)

## 2021-02-03 LAB — VITAMIN D 25 HYDROXY (VIT D DEFICIENCY, FRACTURES): Vit D, 25-Hydroxy: 11.1 ng/mL — ABNORMAL LOW (ref 30.0–100.0)

## 2021-02-14 NOTE — Patient Instructions (Signed)
Diabetes Mellitus and Nutrition, Adult When you have diabetes, or diabetes mellitus, it is very important to have healthy eating habits because your blood sugar (glucose) levels are greatly affected by what you eat and drink. Eating healthy foods in the right amounts, at about the same times every day, can help you:  Control your blood glucose.  Lower your risk of heart disease.  Improve your blood pressure.  Reach or maintain a healthy weight. What can affect my meal plan? Every person with diabetes is different, and each person has different needs for a meal plan. Your health care provider may recommend that you work with a dietitian to make a meal plan that is best for you. Your meal plan may vary depending on factors such as:  The calories you need.  The medicines you take.  Your weight.  Your blood glucose, blood pressure, and cholesterol levels.  Your activity level.  Other health conditions you have, such as heart or kidney disease. How do carbohydrates affect me? Carbohydrates, also called carbs, affect your blood glucose level more than any other type of food. Eating carbs naturally raises the amount of glucose in your blood. Carb counting is a method for keeping track of how many carbs you eat. Counting carbs is important to keep your blood glucose at a healthy level, especially if you use insulin or take certain oral diabetes medicines. It is important to know how many carbs you can safely have in each meal. This is different for every person. Your dietitian can help you calculate how many carbs you should have at each meal and for each snack. How does alcohol affect me? Alcohol can cause a sudden decrease in blood glucose (hypoglycemia), especially if you use insulin or take certain oral diabetes medicines. Hypoglycemia can be a life-threatening condition. Symptoms of hypoglycemia, such as sleepiness, dizziness, and confusion, are similar to symptoms of having too much  alcohol.  Do not drink alcohol if: ? Your health care provider tells you not to drink. ? You are pregnant, may be pregnant, or are planning to become pregnant.  If you drink alcohol: ? Do not drink on an empty stomach. ? Limit how much you use to:  0-1 drink a day for women.  0-2 drinks a day for men. ? Be aware of how much alcohol is in your drink. In the [place] one drink equals one 12 oz bottle of beer (355 mL), one 5 oz glass of wine (148 mL), or one 1 oz glass of hard liquor (44 mL). ? Keep yourself hydrated with water, diet soda, or unsweetened iced tea.  Keep in mind that regular soda, juice, and other mixers may contain a lot of sugar and must be counted as carbs. What are tips for following this plan? Reading food labels  Start by checking the serving size on the "Nutrition Facts" label of packaged foods and drinks. The amount of calories, carbs, fats, and other nutrients listed on the label is based on one serving of the item. Many items contain more than one serving per package.  Check the total grams (g) of carbs in one serving. You can calculate the number of servings of carbs in one serving by dividing the total carbs by 15. For example, if a food has 30 g of total carbs per serving, it would be equal to 2 servings of carbs.  Check the number of grams (g) of saturated fats and trans fats in one serving. Choose foods that have   a low amount or none of these fats.  Check the number of milligrams (mg) of salt (sodium) in one serving. Most people should limit total sodium intake to less than 2,300 mg per day.  Always check the nutrition information of foods labeled as "low-fat" or "nonfat." These foods may be higher in added sugar or refined carbs and should be avoided.  Talk to your dietitian to identify your daily goals for nutrients listed on the label. Shopping  Avoid buying canned, pre-made, or processed foods. These foods tend to be high in fat, sodium, and added  sugar.  Shop around the outside edge of the grocery store. This is where you will most often find fresh fruits and vegetables, bulk grains, fresh meats, and fresh dairy. Cooking  Use low-heat cooking methods, such as baking, instead of high-heat cooking methods like deep frying.  Cook using healthy oils, such as olive, canola, or sunflower oil.  Avoid cooking with butter, cream, or high-fat meats. Meal planning  Eat meals and snacks regularly, preferably at the same times every day. Avoid going long periods of time without eating.  Eat foods that are high in fiber, such as fresh fruits, vegetables, beans, and whole grains. Talk with your dietitian about how many servings of carbs you can eat at each meal.  Eat 4-6 oz (112-168 g) of lean protein each day, such as lean meat, chicken, fish, eggs, or tofu. One ounce (oz) of lean protein is equal to: ? 1 oz (28 g) of meat, chicken, or fish. ? 1 egg. ?  cup (62 g) of tofu.  Eat some foods each day that contain healthy fats, such as avocado, nuts, seeds, and fish.   What foods should I eat? Fruits Berries. Apples. Oranges. Peaches. Apricots. Plums. Grapes. Mango. Papaya. Pomegranate. Kiwi. Cherries. Vegetables Lettuce. Spinach. Leafy greens, including kale, chard, collard greens, and mustard greens. Beets. Cauliflower. Cabbage. Broccoli. Carrots. Green beans. Tomatoes. Peppers. Onions. Cucumbers. Brussels sprouts. Grains Whole grains, such as whole-wheat or whole-grain bread, crackers, tortillas, cereal, and pasta. Unsweetened oatmeal. Quinoa. Brown or wild rice. Meats and other proteins Seafood. Poultry without skin. Lean cuts of poultry and beef. Tofu. Nuts. Seeds. Dairy Low-fat or fat-free dairy products such as milk, yogurt, and cheese. The items listed above may not be a complete list of foods and beverages you can eat. Contact a dietitian for more information. What foods should I avoid? Fruits Fruits canned with  syrup. Vegetables Canned vegetables. Frozen vegetables with butter or cream sauce. Grains Refined white flour and flour products such as bread, pasta, snack foods, and cereals. Avoid all processed foods. Meats and other proteins Fatty cuts of meat. Poultry with skin. Breaded or fried meats. Processed meat. Avoid saturated fats. Dairy Full-fat yogurt, cheese, or milk. Beverages Sweetened drinks, such as soda or iced tea. The items listed above may not be a complete list of foods and beverages you should avoid. Contact a dietitian for more information. Questions to ask a health care provider  Do I need to meet with a diabetes educator?  Do I need to meet with a dietitian?  What number can I call if I have questions?  When are the best times to check my blood glucose? Where to find more information:  American Diabetes Association: diabetes.org  Academy of Nutrition and Dietetics: www.eatright.org  National Institute of Diabetes and Digestive and Kidney Diseases: www.niddk.nih.gov  Association of Diabetes Care and Education Specialists: www.diabeteseducator.org Summary  It is important to have healthy eating   habits because your blood sugar (glucose) levels are greatly affected by what you eat and drink.  A healthy meal plan will help you control your blood glucose and maintain a healthy lifestyle.  Your health care provider may recommend that you work with a dietitian to make a meal plan that is best for you.  Keep in mind that carbohydrates (carbs) and alcohol have immediate effects on your blood glucose levels. It is important to count carbs and to use alcohol carefully. This information is not intended to replace advice given to you by your health care provider. Make sure you discuss any questions you have with your health care provider. Document Revised: 04/21/2019 Document Reviewed: 04/21/2019 [employer] Patient Education  2021 [employer]  

## 2021-02-15 ENCOUNTER — Other Ambulatory Visit: Payer: Self-pay

## 2021-02-15 ENCOUNTER — Encounter: Payer: Self-pay | Admitting: Nurse Practitioner

## 2021-02-15 ENCOUNTER — Encounter: Payer: Medicare Other | Admitting: Nurse Practitioner

## 2021-02-15 VITALS — BP 97/62 | HR 91 | Ht 64.25 in | Wt 124.6 lb

## 2021-02-15 DIAGNOSIS — E1065 Type 1 diabetes mellitus with hyperglycemia: Secondary | ICD-10-CM

## 2021-02-15 DIAGNOSIS — E039 Hypothyroidism, unspecified: Secondary | ICD-10-CM

## 2021-02-15 NOTE — Progress Notes (Signed)
Erroneous encounter- patient did not bring meter or logs to appointment as requested

## 2021-02-28 NOTE — Patient Instructions (Signed)

## 2021-03-01 ENCOUNTER — Other Ambulatory Visit: Payer: Self-pay

## 2021-03-01 ENCOUNTER — Ambulatory Visit (INDEPENDENT_AMBULATORY_CARE_PROVIDER_SITE_OTHER): Payer: Medicare Other | Admitting: Nurse Practitioner

## 2021-03-01 ENCOUNTER — Encounter: Payer: Self-pay | Admitting: Nurse Practitioner

## 2021-03-01 VITALS — BP 102/69 | HR 73 | Ht 64.25 in | Wt 128.2 lb

## 2021-03-01 DIAGNOSIS — E782 Mixed hyperlipidemia: Secondary | ICD-10-CM | POA: Diagnosis not present

## 2021-03-01 DIAGNOSIS — E1065 Type 1 diabetes mellitus with hyperglycemia: Secondary | ICD-10-CM

## 2021-03-01 DIAGNOSIS — E039 Hypothyroidism, unspecified: Secondary | ICD-10-CM | POA: Diagnosis not present

## 2021-03-01 LAB — POCT GLYCOSYLATED HEMOGLOBIN (HGB A1C): HbA1c, POC (controlled diabetic range): 9.2 % — AB (ref 0.0–7.0)

## 2021-03-01 MED ORDER — VITAMIN D (ERGOCALCIFEROL) 1.25 MG (50000 UNIT) PO CAPS
50000.0000 [IU] | ORAL_CAPSULE | ORAL | 0 refills | Status: DC
Start: 2021-03-01 — End: 2021-04-18

## 2021-03-01 MED ORDER — OMNIPOD 5 DEXG7G6 PODS GEN 5 MISC: 1.0000 "application " | 12 refills | Status: AC

## 2021-03-01 MED ORDER — OMNIPOD 5 DEXG7G6 INTRO GEN 5 KIT: 1.0000 | PACK | Freq: Once | 0 refills | Status: AC

## 2021-03-01 MED ORDER — PANCRELIPASE (LIP-PROT-AMYL) 24000-76000 UNITS PO CPEP: 2.0000 | ORAL_CAPSULE | Freq: Three times a day (TID) | ORAL | 3 refills | Status: DC

## 2021-03-01 MED ORDER — DEXCOM G6 TRANSMITTER MISC
3 refills | Status: DC
Start: 2021-03-01 — End: 2021-04-18

## 2021-03-01 MED ORDER — DEXCOM G6 SENSOR MISC
3 refills | Status: DC
Start: 2021-03-01 — End: 2021-04-18

## 2021-03-01 NOTE — Progress Notes (Signed)
Endocrinology follow-up note       03/01/2021, 1:55 PM   Subjective:    Patient ID: Kevin Cooper., male    DOB: 1981-02-27.  Kevin Cooper. is being seen in follow-up for management of currently uncontrolled symptomatic type 1 diabetes, hypothyroidism. PMD:   Soyla Dryer, PA-C.   Past Medical History:  Diagnosis Date   Chronic right hip pain    Depression    Diabetes mellitus    diagnosed age 40   Seizures ([place])    last one january 2016 due to low bs   Thyroid disease    Past Surgical History:  Procedure Laterality Date   EYE SURGERY Right age 68   Social History   Socioeconomic History   Marital status: Legally Separated    Spouse name: Not on file   Number of children: Not on file   Years of education: Not on file   Highest education level: Not on file  Occupational History   Not on file  Tobacco Use   Smoking status: Former    Packs/day: 2.00    Years: 4.00    Pack years: 8.00    Types: Cigarettes    Quit date: 05/29/2003    Years since quitting: 17.7   Smokeless tobacco: Current    Types: Chew  Vaping Use   Vaping Use: Never used  Substance and Sexual Activity   Alcohol use: No    Comment: occassional rare beer or liqour   Drug use: No   Sexual activity: Yes    Birth control/protection: Condom  Other Topics Concern   Not on file  Social History Narrative   Not on file   Social Determinants of Health   Financial Resource Strain: Not on file  Food Insecurity: Not on file  Transportation Needs: Not on file  Physical Activity: Not on file  Stress: Not on file  Social Connections: Not on file   Outpatient Encounter Medications as of 03/01/2021  Medication Sig   atorvastatin (LIPITOR) 20 MG tablet Take 1 tablet (20 mg total) by mouth daily.   Continuous Blood Gluc Sensor (DEXCOM G6 SENSOR) MISC Change sensor every 10 days as directed   Continuous Blood Gluc Transmit  (DEXCOM G6 TRANSMITTER) MISC Change transmitter every 90 days as directed.   FLUoxetine (PROZAC) 20 MG capsule Take 1 capsule (20 mg total) by mouth at bedtime.   gabapentin (NEURONTIN) 300 MG capsule Take 1 capsule (300 mg total) by mouth 2 (two) times daily.   GLOBAL EASE INJECT PEN NEEDLES 31G X 5 MM MISC Inject into the skin.   insulin aspart (NOVOLOG) 100 UNIT/ML injection Inject 2-5 Units into the skin 3 (three) times daily with meals. (Patient taking differently: Inject 5-10 Units into the skin 3 (three) times daily with meals.)   Insulin Degludec (TRESIBA) 100 UNIT/ML SOLN Inject 24 Units into the skin at bedtime.   Insulin Disposable Pump (OMNIPOD 5 G6 INTRO, GEN 5,) KIT 1 kit by Does not apply route once for 1 dose.   Insulin Disposable Pump (OMNIPOD 5 G6 POD, GEN 5,) MISC 1 application by Does not apply route every  other day for 5 doses. Change pod every 48-72 hours   levothyroxine (SYNTHROID) 100 MCG tablet Take 1 tablet (100 mcg total) by mouth daily before breakfast.   Pancrelipase, Lip-Prot-Amyl, 24000-76000 units CPEP Take 2 capsules (48,000 Units total) by mouth 3 (three) times daily before meals. Take 1 tablet prior to snacks   traMADol (ULTRAM) 50 MG tablet Take 50 mg by mouth 3 (three) times daily as needed.   Vitamin D, Ergocalciferol, (DRISDOL) 1.25 MG (50000 UNIT) CAPS capsule Take 1 capsule (50,000 Units total) by mouth every 7 (seven) days.   No facility-administered encounter medications on file as of 03/01/2021.    ALLERGIES: Allergies  Allergen Reactions   Tomato Hives, Itching and Rash   Penicillins Other (See Comments) and Rash    UNKNOWN REACTION    VACCINATION STATUS:  There is no immunization history on file for this patient.  Diabetes He presents for his follow-up diabetic visit. He has type 1 diabetes mellitus. Onset time: He was diagnosed at approximate age of 89 years. His disease course has been improving. Hypoglycemia symptoms include hunger and  sweats. Pertinent negatives for hypoglycemia include no confusion, dizziness, nervousness/anxiousness or pallor. Associated symptoms include fatigue. Pertinent negatives for diabetes include no chest pain, no polydipsia, no polyphagia, no polyuria, no weakness and no weight loss. Hypoglycemia complications include nocturnal hypoglycemia. Symptoms are improving. Diabetic complications include impotence and peripheral neuropathy. Risk factors for coronary artery disease include diabetes mellitus, tobacco exposure, sedentary lifestyle, family history, male sex and dyslipidemia. Current diabetic treatment includes insulin injections. He is compliant with treatment most of the time. His weight is increasing steadily. He is following a generally unhealthy diet. Meal planning includes carbohydrate counting and ADA exchanges. He has not had a previous visit with a dietitian. He rarely participates in exercise. His home blood glucose trend is decreasing steadily. His breakfast blood glucose range is generally 90-110 mg/dl. His lunch blood glucose range is generally 140-180 mg/dl. His dinner blood glucose range is generally 180-200 mg/dl. His bedtime blood glucose range is generally >200 mg/dl. (He presents today, accompanied by his wife, with his logs, no meter to review.  His POCT A1c today is 9.2%, improving still from last visit of 9.7%.  He is not having as many episodes of hypoglycemia, a huge improvement for him.  He has also been approved for disability and now has steady insurance, therefore will have more options to help him manage his diabetes and health overall.  He tells me he would like to explore the option of having an insulin pump.  He is familiar with the Dexcom CGM as well and would like to get back on one of those.) An ACE inhibitor/angiotensin II receptor blocker is not being taken. He does not see a podiatrist.Eye exam is current.  Thyroid Problem Presents for follow-up visit. The condition has lasted  for  years. Symptoms include diarrhea and fatigue. Patient reports no anxiety, cold intolerance, constipation, heat intolerance, palpitations, weight gain or weight loss. The symptoms have been stable. Past treatments include levothyroxine. The treatment provided mild relief. His past medical history is significant for diabetes and hyperlipidemia. Risk factors include family history of hypothyroidism.  Erectile Dysfunction This is a chronic problem. The current episode started more than 1 year ago. The problem has been gradually worsening since onset. The nature of his difficulty is achieving erection and maintaining erection. He reports no anxiety or decreased libido. Irritative symptoms do not include urgency. Pertinent negatives include no chills, dysuria  or hematuria. Past treatments include nothing. Risk factors include diabetes mellitus, penile trauma and tobacco use (was stepped on by a horse as a teenager in the groin causing trauma).  Hyperlipidemia This is a chronic problem. The current episode started more than 1 year ago. The problem is uncontrolled. Recent lipid tests were reviewed and are high. Exacerbating diseases include diabetes and hypothyroidism. There are no known factors aggravating his hyperlipidemia. Pertinent negatives include no chest pain or myalgias. Current antihyperlipidemic treatment includes statins. There are no compliance problems.  Risk factors for coronary artery disease include diabetes mellitus, dyslipidemia, male sex and a sedentary lifestyle.     Review of Systems  Constitutional:  Positive for fatigue. Negative for chills, unexpected weight change, weight gain and weight loss.  HENT:  Negative for dental problem and trouble swallowing.   Eyes:  Negative for visual disturbance.  Respiratory:  Negative for choking, chest tightness and wheezing.   Cardiovascular:  Negative for chest pain, palpitations and leg swelling.  Gastrointestinal:  Positive for diarrhea and  vomiting. Negative for abdominal distention, constipation and nausea.       Intermittent nocturnal fecal incontinence  Endocrine: Negative for cold intolerance, heat intolerance, polydipsia, polyphagia and polyuria.  Genitourinary:  Positive for impotence. Negative for decreased libido, dysuria, flank pain, hematuria and urgency.       Difficulty achieving and maintaining an erection  Musculoskeletal:  Positive for arthralgias. Negative for back pain, gait problem, myalgias and neck pain.       In shoulders and hips  Skin:  Negative for pallor, rash and wound.  Neurological:  Positive for numbness. Negative for dizziness and weakness.       And burning sensation in bilateral feet  Psychiatric/Behavioral:  Negative for confusion and dysphoric mood. The patient is not nervous/anxious.    Objective:    BP 102/69   Pulse 73   Ht 5' 4.25" (1.632 m)   Wt 128 lb 3.2 oz (58.2 kg)   BMI 21.83 kg/m   Wt Readings from Last 3 Encounters:  03/01/21 128 lb 3.2 oz (58.2 kg)  02/15/21 124 lb 9.6 oz (56.5 kg)  07/25/20 131 lb 12.8 oz (59.8 kg)    BP Readings from Last 3 Encounters:  03/01/21 102/69  02/15/21 97/62  07/25/20 (!) 145/88    Physical Exam- Limited  Constitutional:  Body mass index is 22.45 kg/m. , not in acute distress, appears less anxious this visit Eyes:  EOMI, no exophthalmos Neck: Supple Cardiovascular: RRR, no murmurs, rubs, or gallops, no edema Respiratory: Adequate breathing efforts, no crackles, rales, rhonchi, or wheezing Musculoskeletal: no gross deformities, strength intact in all four extremities, no gross restriction of joint movements, walks with cane Skin:  no rashes, no hyperemia, extensive tattoos Neurological: no tremor with outstretched hands     CMP ( most recent) CMP     Component Value Date/Time   NA 136 02/02/2021 0935   K 4.4 02/02/2021 0935   CL 96 02/02/2021 0935   CO2 23 02/02/2021 0935   GLUCOSE 428 (H) 02/02/2021 0935   GLUCOSE 177 (H)  05/25/2020 0849   BUN 8 02/02/2021 0935   CREATININE 0.99 02/02/2021 0935   CREATININE 0.87 07/15/2018 1111   CALCIUM 9.7 02/02/2021 0935   PROT 7.3 02/02/2021 0935   ALBUMIN 5.2 (H) 02/02/2021 0935   AST 27 02/02/2021 0935   ALT 19 02/02/2021 0935   ALKPHOS 187 (H) 02/02/2021 0935   BILITOT 0.4 02/02/2021 0935   GFRNONAA >60 05/25/2020  [place] 07/15/2018 1111   GFRAA >60 12/23/2019 1050   GFRAA 127 07/15/2018 1111    Diabetic Labs (most recent): Lab Results  Component Value Date   HGBA1C 9.2 (A) 03/01/2021   HGBA1C 9.7 (A) 07/25/2020   HGBA1C 11.0 (H) 12/23/2019     Lab Results  Component Value Date   TSH 298.000 (H) 02/02/2021   TSH 263.000 (H) 07/19/2020   TSH 181.194 (H) 12/23/2019   TSH 14.02 (H) 07/15/2018   TSH 134.12 (H) 08/11/2015   TSH 32.191 (H) 10/30/2011   FREET4 0.21 (L) 02/02/2021   FREET4 0.23 (L) 07/19/2020   FREET4 0.37 (L) 12/23/2019   FREET4 1.3 07/15/2018   FREET4 0.45 (L) 11/01/2011    Lipid Panel     Component Value Date/Time   CHOL 183 05/25/2020 0849   TRIG 60 05/25/2020 0849   HDL 63 05/25/2020 0849   CHOLHDL 2.9 05/25/2020 0849   VLDL 12 05/25/2020 0849   LDLCALC 108 (H) 05/25/2020 0849   LDLCALC 63 07/15/2018 1111    Assessment & Plan:   1) Uncontrolled type 1 diabetes mellitus with hyperglycemia (HCC) - [name]. has currently uncontrolled symptomatic type 1 DM since 41 years of age.  He presents today, accompanied by his wife, with his logs, no meter to review.  His POCT A1c today is 9.2%, improving still from last visit of 9.7%.  He is not having as many episodes of hypoglycemia, a huge improvement for him.  He has also been approved for disability and now has steady insurance, therefore will have more options to help him manage his diabetes and health overall.  He tells me he would like to explore the option of having an insulin pump.  He is familiar with the Dexcom CGM as well and would like to get back on one  of those.  -his diabetes is complicated by peripheral neuropathy, tobacco use/abuse and he remains at a high risk for more acute and chronic complications which include CAD, CVA, CKD, retinopathy, and neuropathy. These are all discussed in detail with him.  - Nutritional counseling repeated at each appointment due to patients tendency to fall back in to old habits.  - The patient admits there is a room for improvement in their diet and drink choices. -  Suggestion is made for the patient to avoid simple carbohydrates from their diet including Cakes, Sweet Desserts / Pastries, Ice Cream, Soda (diet and regular), Sweet Tea, Candies, Chips, Cookies, Sweet Pastries, Store Bought Juices, Alcohol in Excess of 1-2 drinks a day, Artificial Sweeteners, Coffee Creamer, and "Sugar-free" Products. This will help patient to have stable blood glucose profile and potentially avoid unintended weight gain.   - I encouraged the patient to switch to unprocessed or minimally processed complex starch and increased protein intake (animal or plant source), fruits, and vegetables.   - Patient is advised to stick to a routine mealtimes to eat 3 meals a day and avoid unnecessary snacks (to snack only to correct hypoglycemia).  - he has been scheduled with Jearld Fenton, RDN, CDE for individualized diabetes education previously.  - I have approached him with the following individualized plan to manage diabetes and patient agrees:   -He will continue to require intensive treatment with basal/bolus insulin in order for him to achieve and maintain control of diabetes to target.  He could greatly benefit from insulin pump, and now that he has steady insurance, would like to proceed with obtaining one.  -  Given his stable glycemic profile without frequent hypoglycemia, he is advised to continue his current dose of Tresiba at 24 units SQ nightly and Novolog 5-8 units TID with meals if glucose is above 70 and he is eating  (Specific instructions on how to titrate insulin dosage based on glucose readings given to patient in writing.)  I will start the process of ordering him the Omnipod and Dexcom (he chose tubeless delivery option due to his pets).   -He is advised to restart monitoring blood glucose at least 4 times a day, before meals and at bedtime.  He is also instructed to call the clinic if his blood glucose is less than 70 or greater than 200 for 3 tests in a row.  - he is warned not to take insulin without proper monitoring per orders.  -He is not a candidate for non-insulin medications for diabetes management.  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  Will trial Creon 2 capsules before each meal and 1 capsule before snacks to help improve meal absorption.  - Patient specific target  A1c;  LDL, HDL, Triglycerides, and  Waist Circumference were discussed in detail.  2) Blood Pressure /Hypertension: His blood pressure is controlled to target.  He is not on any antihypertensives at this time. He will be considered for low dose ACE or ARB if BP is elevated on 3 separate visits.  3) Lipids/Hyperlipidemia:  His recent lipid panel from 05/25/20 shows uncontrolled LDL at 106, slightly up from last visit.  He is advised to continue Lipitor 20 mg po daily at bedtime.  Side effects and precautions discussed with him.  Will recheck on subsequent visits.  4)  Weight/Diet:  His Body mass index is 21.83 kg/m.  He is not a candidate for weight loss.  CDE Consult is in process. Exercise, and detailed carbohydrates information provided  -  detailed on discharge instructions.  5) Hypothyroidism His previsit TFTs are consistent with under-replacement.  He has been taking his medication in the middle of the night when he wakes to use the restroom.  I suspect he has absorption issues.  He is advised to continue Levothyroxine 100 mcg po daily before breakfast for  now.   - We discussed about the correct intake of his thyroid hormone, on empty stomach at fasting, with water, separated by at least 30 minutes from breakfast and other medications,  and separated by more than 4 hours from calcium, iron, multivitamins, acid reflux medications (PPIs). -Patient is made aware of the fact that thyroid hormone replacement is needed for life, dose to be adjusted by periodic monitoring of thyroid function tests.  6) Vitamin D deficiency: His recent vitamin D level from 02/02/21 was low at 11.1.  I discussed and initiated replenishment with Ergocalciferol 50000 units PO weekly x 12 weeks.  7) Chronic Care/Health Maintenance: -he is not on ACEI/ARB and is on Statin medications and is encouraged to initiate and continue to follow up with Ophthalmology, Dentist,  Podiatrist at least yearly or according to recommendations, and advised to quit tobacco use/abuse. I have recommended yearly flu vaccine and pneumonia vaccine at least every 5 years; moderate intensity exercise for up to 150 minutes weekly; and  sleep for at least 7 hours a day.  - he is advised to maintain close follow up with Soyla Dryer, PA-C for primary care needs, as well as his other providers for optimal and coordinated care.  I spent 44 minutes in the care of the patient today including review of labs from [place], Lipids, Thyroid Function, Hematology (current and previous including abstractions from other facilities); face-to-face time discussing  his blood glucose readings/logs, discussing hypoglycemia and hyperglycemia episodes and symptoms, medications doses, his options of short and long term treatment based on the latest standards of care / guidelines;  discussion about incorporating lifestyle medicine;  and documenting the encounter.    Please refer to Patient Instructions for Blood Glucose Monitoring and Insulin/Medications Dosing Guide"  in media tab for additional information. Please  also refer  to " Patient Self Inventory" in the Media  tab for reviewed elements of pertinent patient history.  [name]. participated in the discussions, expressed understanding, and voiced agreement with the above plans.  All questions were answered to his satisfaction. he is encouraged to contact clinic should he have any questions or concerns prior to his return visit.     Follow up plan: - Return in about 1 month (around 04/01/2021) for Diabetes F/U, Bring meter and logs.  Rayetta Pigg, [facility] [address] [place],  [address] Phone: [phone] Fax: [phone]   03/01/2021, 1:55 PM

## 2021-04-03 ENCOUNTER — Ambulatory Visit: Payer: Medicare Other | Admitting: Nurse Practitioner

## 2021-04-03 ENCOUNTER — Other Ambulatory Visit (HOSPITAL_COMMUNITY): Payer: Self-pay | Admitting: Podiatry

## 2021-04-03 DIAGNOSIS — I739 Peripheral vascular disease, unspecified: Secondary | ICD-10-CM

## 2021-04-03 NOTE — Patient Instructions (Incomplete)

## 2021-04-06 ENCOUNTER — Other Ambulatory Visit: Payer: Self-pay

## 2021-04-06 ENCOUNTER — Ambulatory Visit (HOSPITAL_COMMUNITY)
Admission: RE | Admit: 2021-04-06 | Discharge: 2021-04-06 | Disposition: A | Discharge status: Home / Self Care | Payer: Medicare Other | Source: Ambulatory Visit | Attending: Podiatry | Admitting: Podiatry

## 2021-04-06 DIAGNOSIS — I739 Peripheral vascular disease, unspecified: Secondary | ICD-10-CM | POA: Diagnosis present

## 2021-04-11 ENCOUNTER — Telehealth: Payer: Self-pay

## 2021-04-11 NOTE — Telephone Encounter (Signed)
Patient came in the office today and let us know that he has not received any of his Financial controller. I called his pharmacy and spoke to [facility] and she stated that Medicare is still processing for patient. Kevin Cooper is going to call Medicare and find out what is going on and hopefully let us know something.

## 2021-04-18 ENCOUNTER — Encounter: Payer: Self-pay | Admitting: Nurse Practitioner

## 2021-04-18 ENCOUNTER — Ambulatory Visit (INDEPENDENT_AMBULATORY_CARE_PROVIDER_SITE_OTHER): Payer: Medicare Other | Admitting: Nurse Practitioner

## 2021-04-18 VITALS — BP 128/79 | HR 79 | Ht 64.25 in | Wt 121.0 lb

## 2021-04-18 DIAGNOSIS — E782 Mixed hyperlipidemia: Secondary | ICD-10-CM | POA: Diagnosis not present

## 2021-04-18 DIAGNOSIS — E1065 Type 1 diabetes mellitus with hyperglycemia: Secondary | ICD-10-CM | POA: Diagnosis not present

## 2021-04-18 DIAGNOSIS — E039 Hypothyroidism, unspecified: Secondary | ICD-10-CM | POA: Diagnosis not present

## 2021-04-18 MED ORDER — DEXCOM G6 TRANSMITTER MISC: 3 refills | Status: DC

## 2021-04-18 MED ORDER — DEXCOM G6 SENSOR MISC: 3 refills | Status: DC

## 2021-04-18 MED ORDER — DEXCOM G6 RECEIVER DEVI: 0 refills | Status: DC

## 2021-04-18 MED ORDER — LEVOTHYROXINE SODIUM 100 MCG PO TABS
100.0000 ug | ORAL_TABLET | Freq: Every day | ORAL | 0 refills | Status: DC
Start: 2021-04-18 — End: 2021-10-02

## 2021-04-18 MED ORDER — VITAMIN D (ERGOCALCIFEROL) 1.25 MG (50000 UNIT) PO CAPS: 50000.0000 [IU] | ORAL_CAPSULE | ORAL | 0 refills | Status: DC

## 2021-04-18 NOTE — Patient Instructions (Signed)

## 2021-04-18 NOTE — Progress Notes (Signed)
Endocrinology follow-up note       04/18/2021, 11:54 AM   Subjective:    Patient ID: Kevin Cooper., male    DOB: [birth year].  Vahin Discher. is being seen in follow-up for management of currently uncontrolled symptomatic type 1 diabetes, hypothyroidism. PMD:   Default, Provider, MD.   Past Medical History:  Diagnosis Date   Chronic right hip pain    Depression    Diabetes mellitus    diagnosed age 40   Seizures ([place])    last one january 2016 due to low bs   Thyroid disease    Past Surgical History:  Procedure Laterality Date   EYE SURGERY Right age 15   Social History   Socioeconomic History   Marital status: Legally Separated    Spouse name: Not on file   Number of children: Not on file   Years of education: Not on file   Highest education level: Not on file  Occupational History   Not on file  Tobacco Use   Smoking status: Former    Packs/day: 2.00    Years: 4.00    Pack years: 8.00    Types: Cigarettes    Quit date: 05/29/2003    Years since quitting: 17.9   Smokeless tobacco: Current    Types: Chew  Vaping Use   Vaping Use: Never used  Substance and Sexual Activity   Alcohol use: No    Comment: occassional rare beer or liqour   Drug use: No   Sexual activity: Yes    Birth control/protection: Condom  Other Topics Concern   Not on file  Social History Narrative   Not on file   Social Determinants of Health   Financial Resource Strain: Not on file  Food Insecurity: Not on file  Transportation Needs: Not on file  Physical Activity: Not on file  Stress: Not on file  Social Connections: Not on file   Outpatient Encounter Medications as of 04/18/2021  Medication Sig   Continuous Blood Gluc Receiver (DEXCOM G6 RECEIVER) DEVI Use to monitor glucose   atorvastatin (LIPITOR) 20 MG tablet Take 1 tablet (20 mg total) by mouth daily.   Continuous Blood Gluc Sensor (DEXCOM G6 SENSOR)  MISC Change sensor every 10 days as directed   Continuous Blood Gluc Transmit (DEXCOM G6 TRANSMITTER) MISC Change transmitter every 90 days as directed.   FLUoxetine (PROZAC) 20 MG capsule Take 1 capsule (20 mg total) by mouth at bedtime.   gabapentin (NEURONTIN) 300 MG capsule Take 1 capsule (300 mg total) by mouth 2 (two) times daily.   GLOBAL EASE INJECT PEN NEEDLES 31G X 5 MM MISC Inject into the skin.   insulin aspart (NOVOLOG) 100 UNIT/ML injection Inject 2-5 Units into the skin 3 (three) times daily with meals. (Patient taking differently: Via Omnipod)   levothyroxine (SYNTHROID) 100 MCG tablet Take 1 tablet (100 mcg total) by mouth daily before breakfast.   Pancrelipase, Lip-Prot-Amyl, 24000-76000 units CPEP Take 2 capsules (48,000 Units total) by mouth 3 (three) times daily before meals. Take 1 tablet prior to snacks   traMADol (ULTRAM) 50 MG tablet Take 50 mg by mouth 3 (three) times daily as needed.  Vitamin D, Ergocalciferol, (DRISDOL) 1.25 MG (50000 UNIT) CAPS capsule Take 1 capsule (50,000 Units total) by mouth every 7 (seven) days.   [DISCONTINUED] Continuous Blood Gluc Sensor (DEXCOM G6 SENSOR) MISC Change sensor every 10 days as directed (Patient not taking: Reported on 04/18/2021)   [DISCONTINUED] Continuous Blood Gluc Transmit (DEXCOM G6 TRANSMITTER) MISC Change transmitter every 90 days as directed. (Patient not taking: Reported on 04/18/2021)   [DISCONTINUED] Insulin Degludec (TRESIBA) 100 UNIT/ML SOLN Inject 24 Units into the skin at bedtime.   [DISCONTINUED] levothyroxine (SYNTHROID) 100 MCG tablet Take 1 tablet (100 mcg total) by mouth daily before breakfast.   [DISCONTINUED] Vitamin D, Ergocalciferol, (DRISDOL) 1.25 MG (50000 UNIT) CAPS capsule Take 1 capsule (50,000 Units total) by mouth every 7 (seven) days.   No facility-administered encounter medications on file as of 04/18/2021.    ALLERGIES: Allergies  Allergen Reactions   Tomato Hives, Itching and Rash    Penicillins Other (See Comments) and Rash    UNKNOWN REACTION    VACCINATION STATUS:  There is no immunization history on file for this patient.  Diabetes He presents for his follow-up diabetic visit. He has type 1 diabetes mellitus. Onset time: He was diagnosed at approximate age of 49 years. His disease course has been fluctuating. Hypoglycemia symptoms include hunger and sweats. Pertinent negatives for hypoglycemia include no confusion, dizziness, nervousness/anxiousness or pallor. Associated symptoms include fatigue and weight loss. Pertinent negatives for diabetes include no chest pain, no polydipsia, no polyphagia, no polyuria and no weakness. There are no hypoglycemic complications. Symptoms are stable. Diabetic complications include impotence and peripheral neuropathy. Risk factors for coronary artery disease include diabetes mellitus, tobacco exposure, sedentary lifestyle, family history, male sex and dyslipidemia. Current diabetic treatment includes insulin pump. He is compliant with treatment most of the time. His weight is fluctuating minimally. He is following a generally unhealthy diet. Meal planning includes carbohydrate counting and ADA exchanges. He has not had a previous visit with a dietitian. He rarely participates in exercise. His home blood glucose trend is fluctuating dramatically. His overall blood glucose range is 180-200 mg/dl. (He presents today, accompanied by his wife, with his meter and logs (no CGM, has had trouble getting it) showing widely fluctuating glycemic profile with less episodes of hypoglycemia.  He was not due for another A1c today.  He is still adjusting to his Omnipod -has only been using it for about a week (is in manual mode since he has not received the Dexcom just yet).  Analysis of his meter shows 7-day average of 260, 14-day average of 204, 30-day average of 173, 90-day average of 183.) An ACE inhibitor/angiotensin II receptor blocker is not being taken. He  does not see a podiatrist.Eye exam is current.  Thyroid Problem Presents for follow-up visit. The condition has lasted for  years. Symptoms include diarrhea, fatigue and weight loss. Patient reports no anxiety, cold intolerance, constipation, heat intolerance, palpitations or weight gain. The symptoms have been stable. Past treatments include levothyroxine. The treatment provided mild relief. His past medical history is significant for diabetes and hyperlipidemia. Risk factors include family history of hypothyroidism.  Erectile Dysfunction This is a chronic problem. The current episode started more than 1 year ago. The problem has been gradually worsening since onset. The nature of his difficulty is achieving erection and maintaining erection. He reports no anxiety or decreased libido. Irritative symptoms do not include urgency. Pertinent negatives include no chills, dysuria or hematuria. Past treatments include nothing. Risk factors include diabetes  mellitus, penile trauma and tobacco use (was stepped on by a horse as a teenager in the groin causing trauma).  Hyperlipidemia This is a chronic problem. The current episode started more than 1 year ago. The problem is uncontrolled. Recent lipid tests were reviewed and are high. Exacerbating diseases include diabetes and hypothyroidism. There are no known factors aggravating his hyperlipidemia. Pertinent negatives include no chest pain or myalgias. Current antihyperlipidemic treatment includes statins. There are no compliance problems.  Risk factors for coronary artery disease include diabetes mellitus, dyslipidemia, male sex and a sedentary lifestyle.     Review of Systems  Constitutional:  Positive for fatigue and weight loss. Negative for chills, unexpected weight change and weight gain.  HENT:  Negative for dental problem and trouble swallowing.   Eyes:  Negative for visual disturbance.  Respiratory:  Negative for choking, chest tightness and wheezing.    Cardiovascular:  Negative for chest pain, palpitations and leg swelling.  Gastrointestinal:  Positive for diarrhea. Negative for abdominal distention, constipation, nausea and vomiting.       Intermittent nocturnal fecal incontinence  Endocrine: Negative for cold intolerance, heat intolerance, polydipsia, polyphagia and polyuria.  Genitourinary:  Positive for impotence. Negative for decreased libido, dysuria, flank pain, hematuria and urgency.       Difficulty achieving and maintaining an erection  Musculoskeletal:  Positive for arthralgias. Negative for back pain, gait problem, myalgias and neck pain.       In shoulders and hips  Skin:  Negative for pallor, rash and wound.  Neurological:  Positive for numbness. Negative for dizziness and weakness.       And burning sensation in bilateral feet  Psychiatric/Behavioral:  Negative for confusion and dysphoric mood. The patient is not nervous/anxious.    Objective:    BP 128/79   Pulse 79   Ht 5' 4.25" (1.632 m)   Wt 121 lb (54.9 kg)   BMI 20.61 kg/m   Wt Readings from Last 3 Encounters:  04/18/21 121 lb (54.9 kg)  03/01/21 128 lb 3.2 oz (58.2 kg)  02/15/21 124 lb 9.6 oz (56.5 kg)    BP Readings from Last 3 Encounters:  04/18/21 128/79  03/01/21 102/69  02/15/21 97/62    Physical Exam- Limited  Constitutional:  Body mass index is 22.45 kg/m. , not in acute distress, appears less anxious this visit Eyes:  EOMI, no exophthalmos Neck: Supple Cardiovascular: RRR, no murmurs, rubs, or gallops, no edema Respiratory: Adequate breathing efforts, no crackles, rales, rhonchi, or wheezing Musculoskeletal: no gross deformities, strength intact in all four extremities, no gross restriction of joint movements, walks with cane Skin:  no rashes, no hyperemia, extensive tattoos Neurological: no tremor with outstretched hands     CMP ( most recent) CMP     Component Value Date/Time   NA 136 02/02/2021 0935   K 4.4 02/02/2021 0935    CL 96 02/02/2021 0935   CO2 23 02/02/2021 0935   GLUCOSE 428 (H) 02/02/2021 0935   GLUCOSE 177 (H) 05/25/2020 0849   BUN 8 02/02/2021 0935   CREATININE 0.99 02/02/2021 0935   CREATININE 0.87 07/15/2018 1111   CALCIUM 9.7 02/02/2021 0935   PROT 7.3 02/02/2021 0935   ALBUMIN 5.2 (H) 02/02/2021 0935   AST 27 02/02/2021 0935   ALT 19 02/02/2021 0935   ALKPHOS 187 (H) 02/02/2021 0935   BILITOT 0.4 02/02/2021 0935   GFRNONAA >60 05/25/2020 0849   GFRNONAA 109 07/15/2018 1111   GFRAA >60 12/23/2019 1050  GFRAA 127 07/15/2018 1111    Diabetic Labs (most recent): Lab Results  Component Value Date   HGBA1C 9.2 (A) 03/01/2021   HGBA1C 9.7 (A) 07/25/2020   HGBA1C 11.0 (H) 12/23/2019     Lab Results  Component Value Date   TSH 298.000 (H) 02/02/2021   TSH 263.000 (H) 07/19/2020   TSH 181.194 (H) 12/23/2019   TSH 14.02 (H) 07/15/2018   TSH 134.12 (H) 08/11/2015   TSH 32.191 (H) 10/30/2011   FREET4 0.21 (L) 02/02/2021   FREET4 0.23 (L) 07/19/2020   FREET4 0.37 (L) 12/23/2019   FREET4 1.3 07/15/2018   FREET4 0.45 (L) 11/01/2011    Lipid Panel     Component Value Date/Time   CHOL 183 05/25/2020 0849   TRIG 60 05/25/2020 0849   HDL 63 05/25/2020 0849   CHOLHDL 2.9 05/25/2020 0849   VLDL 12 05/25/2020 0849   LDLCALC 108 (H) 05/25/2020 0849   LDLCALC 63 07/15/2018 1111    Assessment & Plan:   1) Uncontrolled type 1 diabetes mellitus with hyperglycemia (HCC) - Kevin Cooper. has currently uncontrolled symptomatic type 1 DM since 40 years of age.  He presents today, accompanied by his wife, with his meter and logs (no CGM, has had trouble getting it) showing widely fluctuating glycemic profile with less episodes of hypoglycemia.  He was not due for another A1c today.  He is still adjusting to his Omnipod -has only been using it for about a week (is in manual mode since he has not received the Dexcom just yet).  Analysis of his meter shows 7-day average of 260, 14-day average of  204, 30-day average of 173, 90-day average of 183.  -his diabetes is complicated by peripheral neuropathy, tobacco use/abuse and he remains at a high risk for more acute and chronic complications which include CAD, CVA, CKD, retinopathy, and neuropathy. These are all discussed in detail with him.  - Nutritional counseling repeated at each appointment due to patients tendency to fall back in to old habits.  - The patient admits there is a room for improvement in their diet and drink choices. -  Suggestion is made for the patient to avoid simple carbohydrates from their diet including Cakes, Sweet Desserts / Pastries, Ice Cream, Soda (diet and regular), Sweet Tea, Candies, Chips, Cookies, Sweet Pastries, Store Bought Juices, Alcohol in Excess of 1-2 drinks a day, Artificial Sweeteners, Coffee Creamer, and "Sugar-free" Products. This will help patient to have stable blood glucose profile and potentially avoid unintended weight gain.   - I encouraged the patient to switch to unprocessed or minimally processed complex starch and increased protein intake (animal or plant source), fruits, and vegetables.   - Patient is advised to stick to a routine mealtimes to eat 3 meals a day and avoid unnecessary snacks (to snack only to correct hypoglycemia).  - he has been scheduled with Jearld Fenton, RDN, CDE for individualized diabetes education previously.  - I have approached him with the following individualized plan to manage diabetes and patient agrees:   -He is adjusting well to his Omnipod, is advised to keep current settings (see Chart Review Media for current settings per [facility] trainer).  He will benefit further from having it in automated mode once he gets his Dexcom.  I did provide him with a sample Dexcom today.  -He is advised to continue monitoring blood glucose at least 4 times a day, before meals and at bedtime.  He is also instructed to call the  clinic if his blood glucose is less than 70 or  greater than 200 for 3 tests in a row.  - he is warned not to take insulin without proper monitoring per orders.  -He is not a candidate for non-insulin medications for diabetes management.  -We did trial Creon for chronic diarrhea possibly related to EPI, however the Creon has not helped his symptoms, therefore he is advised to discontinue it and proceed with discussing referral to GI with his PCP.  - Patient specific target  A1c;  LDL, HDL, Triglycerides, and  Waist Circumference were discussed in detail.  2) Blood Pressure /Hypertension: His blood pressure is controlled to target.  He is not on any antihypertensives at this time. He will be considered for low dose ACE or ARB if BP is elevated on 3 separate visits.  3) Lipids/Hyperlipidemia:  His recent lipid panel from 05/25/20 shows uncontrolled LDL at 106, slightly up from last visit.  He is advised to continue Lipitor 20 mg po daily at bedtime.  Side effects and precautions discussed with him.  Will recheck on subsequent visits.  4)  Weight/Diet:  His Body mass index is 20.61 kg/m.  He is not a candidate for weight loss.  CDE Consult is in process. Exercise, and detailed carbohydrates information provided  -  detailed on discharge instructions.  5) Hypothyroidism There are no recent TFTs to review.  He is advised to continue Levothyroxine 100 mcg po daily before breakfast for now.  He has been taking his medication in the middle of the night when he wakes to use the restroom.  I suspect he has some form of absorption issue impacting his control.    - We discussed about the correct intake of his thyroid hormone, on empty stomach at fasting, with water, separated by at least 30 minutes from breakfast and other medications,  and separated by more than 4 hours from calcium, iron, multivitamins, acid reflux medications (PPIs). -Patient is made aware of the fact that thyroid hormone replacement is needed for life, dose to be adjusted by  periodic monitoring of thyroid function tests.  6) Vitamin D deficiency: His recent vitamin D level from 02/02/21 was low at 11.1.  He is currently on Ergocalciferol 50000 units weekly x 12 weeks.  7) Chronic Care/Health Maintenance: -he is not on ACEI/ARB and is on Statin medications and is encouraged to initiate and continue to follow up with Ophthalmology, Dentist,  Podiatrist at least yearly or according to recommendations, and advised to quit tobacco use/abuse. I have recommended yearly flu vaccine and pneumonia vaccine at least every 5 years; moderate intensity exercise for up to 150 minutes weekly; and  sleep for at least 7 hours a day.  - he is advised to maintain close follow up with Default, Provider, MD for primary care needs, as well as his other providers for optimal and coordinated care.      I spent 50 minutes in the care of the patient today including review of labs from [place], Lipids, Thyroid Function, Hematology (current and previous including abstractions from other facilities); face-to-face time discussing  his blood glucose readings/logs, discussing hypoglycemia and hyperglycemia episodes and symptoms, medications doses, his options of short and long term treatment based on the latest standards of care / guidelines;  discussion about incorporating lifestyle medicine;  and documenting the encounter.    Please refer to Patient Instructions for Blood Glucose Monitoring and Insulin/Medications Dosing Guide"  in media tab for additional information. Please  also  refer to " Patient Self Inventory" in the Media  tab for reviewed elements of pertinent patient history.  Kevin Cooper. participated in the discussions, expressed understanding, and voiced agreement with the above plans.  All questions were answered to his satisfaction. he is encouraged to contact clinic should he have any questions or concerns prior to his return visit.     Follow up plan: - Return in about 3 months  (around 07/19/2021) for Diabetes F/U with A1c in office, No previsit labs, Bring meter and logs.  Rayetta Pigg, [facility] [address] [place], [place] [address] Phone: [phone] Fax: [phone]   04/18/2021, 11:54 AM

## 2021-06-01 DIAGNOSIS — S91209A Unspecified open wound of unspecified toe(s) with damage to nail, initial encounter: Secondary | ICD-10-CM | POA: Diagnosis not present

## 2021-06-01 DIAGNOSIS — B353 Tinea pedis: Secondary | ICD-10-CM | POA: Diagnosis not present

## 2021-06-01 DIAGNOSIS — E1042 Type 1 diabetes mellitus with diabetic polyneuropathy: Secondary | ICD-10-CM | POA: Diagnosis not present

## 2021-07-21 ENCOUNTER — Ambulatory Visit: Payer: Medicare Other | Admitting: Nurse Practitioner

## 2021-07-24 ENCOUNTER — Other Ambulatory Visit: Payer: Self-pay | Admitting: Nurse Practitioner

## 2021-08-01 ENCOUNTER — Telehealth: Payer: Self-pay

## 2021-08-01 NOTE — Telephone Encounter (Signed)
Sent fax to start prior authorization for Dexcom supplies to [employer], fax [phone] and confirmation received that fax went through. ?

## 2021-08-17 ENCOUNTER — Telehealth: Payer: Self-pay

## 2021-08-17 NOTE — Telephone Encounter (Signed)
Patient is already approved for his Dexcom G6 sensors and transmitter starting 08/14/2021. ?Called [facility] Prior Authorization line at [phone] and was sent to the [facility] line at [phone] and approval was confirmed. ?

## 2021-08-28 ENCOUNTER — Ambulatory Visit (INDEPENDENT_AMBULATORY_CARE_PROVIDER_SITE_OTHER): Payer: Medicare Other | Admitting: Nurse Practitioner

## 2021-08-28 ENCOUNTER — Encounter: Payer: Self-pay | Admitting: Nurse Practitioner

## 2021-08-28 VITALS — BP 138/84 | HR 96 | Ht 64.25 in | Wt 128.0 lb

## 2021-08-28 DIAGNOSIS — E1065 Type 1 diabetes mellitus with hyperglycemia: Secondary | ICD-10-CM | POA: Diagnosis not present

## 2021-08-28 DIAGNOSIS — E782 Mixed hyperlipidemia: Secondary | ICD-10-CM

## 2021-08-28 DIAGNOSIS — E039 Hypothyroidism, unspecified: Secondary | ICD-10-CM | POA: Diagnosis not present

## 2021-08-28 LAB — POCT GLYCOSYLATED HEMOGLOBIN (HGB A1C): HbA1c POC (<> result, manual entry): 10.7 % (ref 4.0–5.6)

## 2021-08-28 MED ORDER — INSULIN LISPRO 100 UNIT/ML ~~LOC~~ SOLN: SUBCUTANEOUS | 3 refills | Status: DC

## 2021-08-28 NOTE — Patient Instructions (Signed)

## 2021-08-28 NOTE — Progress Notes (Signed)
? ?                                                    Endocrinology follow-up note  ?     08/28/2021, 12:32 PM ? ? ?Subjective:  ? ? Patient ID: Kevin Cooper., male    DOB: Nov 29, 1980.  ?Kevin Cooper. is being seen in follow-up for management of currently uncontrolled symptomatic type 1 diabetes, hypothyroidism. ?PMD:   Default, Provider, MD. ? ? ?Past Medical History:  ?Diagnosis Date  ? Chronic right hip pain   ? Depression   ? Diabetes mellitus   ? diagnosed age 36  ? Seizures (HCC)   ? last one january 2016 due to low bs  ? Thyroid disease   ? ?Past Surgical History:  ?Procedure Laterality Date  ? EYE SURGERY Right age 32  ? ?Social History  ? ?Socioeconomic History  ? Marital status: Legally Separated  ?  Spouse name: Not on file  ? Number of children: Not on file  ? Years of education: Not on file  ? Highest education level: Not on file  ?Occupational History  ? Not on file  ?Tobacco Use  ? Smoking status: Former  ?  Packs/day: 2.00  ?  Years: 4.00  ?  Pack years: 8.00  ?  Types: Cigarettes  ?  Quit date: 05/29/2003  ?  Years since quitting: 18.2  ? Smokeless tobacco: Current  ?  Types: Chew  ?Vaping Use  ? Vaping Use: Never used  ?Substance and Sexual Activity  ? Alcohol use: No  ?  Comment: occassional rare beer or liqour  ? Drug use: No  ? Sexual activity: Yes  ?  Birth control/protection: Condom  ?Other Topics Concern  ? Not on file  ?Social History Narrative  ? Not on file  ? ?Social Determinants of Health  ? ?Financial Resource Strain: Not on file  ?Food Insecurity: Not on file  ?Transportation Needs: Not on file  ?Physical Activity: Not on file  ?Stress: Not on file  ?Social Connections: Not on file  ? ?Outpatient Encounter Medications as of 08/28/2021  ?Medication Sig  ? insulin lispro (HUMALOG) 100 UNIT/ML injection Use with pump for TDD around 50 units daily.  ? atorvastatin (LIPITOR) 20 MG tablet Take 1 tablet (20 mg total) by mouth daily.  ? Continuous Blood Gluc Receiver (DEXCOM G6  RECEIVER) DEVI Use to monitor glucose  ? Continuous Blood Gluc Sensor (DEXCOM G6 SENSOR) MISC Change sensor every 10 days as directed  ? Continuous Blood Gluc Transmit (DEXCOM G6 TRANSMITTER) MISC Change transmitter every 90 days as directed.  ? FLUoxetine (PROZAC) 20 MG capsule Take 1 capsule (20 mg total) by mouth at bedtime.  ? gabapentin (NEURONTIN) 300 MG capsule Take 1 capsule (300 mg total) by mouth 2 (two) times daily.  ? GLOBAL EASE INJECT PEN NEEDLES 31G X 5 MM MISC Inject into the skin.  ? levothyroxine (SYNTHROID) 100 MCG tablet Take 1 tablet (100 mcg total) by mouth daily before breakfast.  ? Pancrelipase, Lip-Prot-Amyl, 24000-76000 units CPEP Take 2 capsules (48,000 Units total) by mouth 3 (three) times daily before meals. Take 1 tablet prior to snacks  ? traMADol (ULTRAM) 50 MG tablet Take 50 mg by mouth 3 (three) times daily as needed.  ? Vitamin D, Ergocalciferol, (DRISDOL) 1.25 MG (50000  UNIT) CAPS capsule TAKE 1 CAPSULE BY MOUTH ONCE A WEEK.  ? [DISCONTINUED] insulin aspart (NOVOLOG) 100 UNIT/ML injection Inject 2-5 Units into the skin 3 (three) times daily with meals. (Patient taking differently: Via Omnipod)  ? ?No facility-administered encounter medications on file as of 08/28/2021.  ? ? ?ALLERGIES: ?Allergies  ?Allergen Reactions  ? Tomato Hives, Itching and Rash  ? Penicillins Other (See Comments) and Rash  ?  UNKNOWN REACTION  ? ? ?VACCINATION STATUS: ? ?There is no immunization history on file for this patient. ? ?Diabetes ?He presents for his follow-up diabetic visit. He has type 1 diabetes mellitus. Onset time: He was diagnosed at approximate age of 8 years. His disease course has been worsening. There are no hypoglycemic associated symptoms. Pertinent negatives for hypoglycemia include no confusion, dizziness, nervousness/anxiousness or pallor. Associated symptoms include fatigue. Pertinent negatives for diabetes include no chest pain, no polydipsia, no polyphagia, no polyuria, no weakness  and no weight loss. There are no hypoglycemic complications. Symptoms are stable. Diabetic complications include impotence and peripheral neuropathy. Risk factors for coronary artery disease include diabetes mellitus, tobacco exposure, sedentary lifestyle, family history, male sex and dyslipidemia. Current diabetic treatment includes insulin pump. He is compliant with treatment most of the time. His weight is fluctuating minimally. He is following a generally unhealthy diet. Meal planning includes carbohydrate counting and ADA exchanges. He has not had a previous visit with a dietitian. He rarely participates in exercise. His home blood glucose trend is fluctuating dramatically. His overall blood glucose range is >200 mg/dl. (He presents today, accompanied by his wife, with his CGM and pump showing above target glycemic profile overall.  His POCT A1c today is 10.7%, worsening from last visit of 9.2%.  He says he has been worrying mostly about his wifes health and has not done as well with his own.  He also is still using his Omnipod in manual mode as he has not connected his CGM to it yet.  He does plan to hopefully upgrade his telephone to one that is compatible with his Dexcom app.  Analysis of his CGM shows TIR 14%, TAR 85%, TBR < 1% with a GMI of 10.8%.) An ACE inhibitor/angiotensin II receptor blocker is not being taken. He does not see a podiatrist.Eye exam is current.  ?Thyroid Problem ?Presents for follow-up visit. The condition has lasted for  years. Symptoms include diarrhea and fatigue. Patient reports no anxiety, cold intolerance, constipation, heat intolerance, palpitations, weight gain or weight loss. The symptoms have been stable. Past treatments include levothyroxine. The treatment provided mild relief. His past medical history is significant for diabetes and hyperlipidemia. Risk factors include family history of hypothyroidism.  ?Erectile Dysfunction ?This is a chronic problem. The current episode  started more than 1 year ago. The problem has been gradually worsening since onset. The nature of his difficulty is achieving erection and maintaining erection. He reports no anxiety or decreased libido. Irritative symptoms do not include urgency. Pertinent negatives include no chills, dysuria or hematuria. Past treatments include nothing. Risk factors include diabetes mellitus, penile trauma and tobacco use (was stepped on by a horse as a teenager in the groin causing trauma).  ?Hyperlipidemia ?This is a chronic problem. The current episode started more than 1 year ago. The problem is uncontrolled. Recent lipid tests were reviewed and are high. Exacerbating diseases include diabetes and hypothyroidism. There are no known factors aggravating his hyperlipidemia. Pertinent negatives include no chest pain or myalgias. Current antihyperlipidemic treatment includes statins.  There are no compliance problems.  Risk factors for coronary artery disease include diabetes mellitus, dyslipidemia, male sex and a sedentary lifestyle.  ? ? ?Review of systems ? ?Constitutional: + Minimally fluctuating body weight,  current Body mass index is 21.8 kg/m?. , no fatigue, no subjective hyperthermia, no subjective hypothermia ?Eyes: no blurry vision, no xerophthalmia ?ENT: no sore throat, no nodules palpated in throat, no dysphagia/odynophagia, no hoarseness ?Cardiovascular: no chest pain, no shortness of breath, no palpitations, no leg swelling ?Respiratory: no cough, no shortness of breath ?Gastrointestinal: no nausea/vomiting/diarrhea ?Musculoskeletal: no muscle/joint aches, walks with cane ?Skin: no rashes, no hyperemia, sees podiatrist who is recommending removing toenails in the near future ?Neurological: no tremors, no numbness, no tingling, no dizziness ?Psychiatric: no depression, no anxiety ? ?Objective:  ?  ?BP 138/84   Pulse 96   Ht 5' 4.25" (1.632 m)   Wt 128 lb (58.1 kg)   BMI 21.80 kg/m?   ?Wt Readings from Last 3  Encounters:  ?08/28/21 128 lb (58.1 kg)  ?04/18/21 121 lb (54.9 kg)  ?03/01/21 128 lb 3.2 oz (58.2 kg)  ?  ?BP Readings from Last 3 Encounters:  ?08/28/21 138/84  ?04/18/21 128/79  ?03/01/21 102/69  ? ? ? ?Phys

## 2021-09-07 DIAGNOSIS — E1042 Type 1 diabetes mellitus with diabetic polyneuropathy: Secondary | ICD-10-CM | POA: Diagnosis not present

## 2021-09-07 DIAGNOSIS — B351 Tinea unguium: Secondary | ICD-10-CM | POA: Diagnosis not present

## 2021-10-02 ENCOUNTER — Encounter: Payer: Self-pay | Admitting: Nurse Practitioner

## 2021-10-02 ENCOUNTER — Ambulatory Visit (INDEPENDENT_AMBULATORY_CARE_PROVIDER_SITE_OTHER): Payer: Medicare Other | Admitting: Nurse Practitioner

## 2021-10-02 VITALS — BP 121/85 | HR 89 | Ht 64.25 in | Wt 121.0 lb

## 2021-10-02 DIAGNOSIS — E039 Hypothyroidism, unspecified: Secondary | ICD-10-CM

## 2021-10-02 DIAGNOSIS — E782 Mixed hyperlipidemia: Secondary | ICD-10-CM | POA: Diagnosis not present

## 2021-10-02 DIAGNOSIS — E1065 Type 1 diabetes mellitus with hyperglycemia: Secondary | ICD-10-CM | POA: Diagnosis not present

## 2021-10-02 LAB — POCT UA - MICROALBUMIN
Creatinine, POC: 50 mg/dL
Microalbumin Ur, POC: 30 mg/L

## 2021-10-02 MED ORDER — LEVOTHYROXINE SODIUM 100 MCG PO TABS: 100.0000 ug | ORAL_TABLET | Freq: Every day | ORAL | 0 refills | Status: DC

## 2021-10-02 NOTE — Patient Instructions (Signed)
Diabetes Mellitus and Foot Care Foot care is an important part of your health, especially when you have diabetes. Diabetes may cause you to have problems because of poor blood flow (circulation) to your feet and legs, which can cause your skin to: Become thinner and drier. Break more easily. Heal more slowly. Peel and crack. You may also have nerve damage (neuropathy) in your legs and feet, causing decreased feeling in them. This means that you may not notice minor injuries to your feet that could lead to more serious problems. Noticing and addressing any potential problems early is the best way to prevent future foot problems. How to care for your feet Foot hygiene  Wash your feet daily with warm water and mild soap. Do not use hot water. Then, pat your feet and the areas between your toes until they are completely dry. Do not soak your feet as this can dry your skin. Trim your toenails straight across. Do not dig under them or around the cuticle. File the edges of your nails with an emery board or nail file. Apply a moisturizing lotion or petroleum jelly to the skin on your feet and to dry, brittle toenails. Use lotion that does not contain alcohol and is unscented. Do not apply lotion between your toes. Shoes and socks Wear clean socks or stockings every day. Make sure they are not too tight. Do not wear knee-high stockings since they may decrease blood flow to your legs. Wear shoes that fit properly and have enough cushioning. Always look in your shoes before you put them on to be sure there are no objects inside. To break in new shoes, wear them for just a few hours a day. This prevents injuries on your feet. Wounds, scrapes, corns, and calluses  Check your feet daily for blisters, cuts, bruises, sores, and redness. If you cannot see the bottom of your feet, use a mirror or ask someone for help. Do not cut corns or calluses or try to remove them with medicine. If you find a minor scrape,  cut, or break in the skin on your feet, keep it and the skin around it clean and dry. You may clean these areas with mild soap and water. Do not clean the area with peroxide, alcohol, or iodine. If you have a wound, scrape, corn, or callus on your foot, look at it several times a day to make sure it is healing and not infected. Check for: Redness, swelling, or pain. Fluid or blood. Warmth. Pus or a bad smell. General tips Do not cross your legs. This may decrease blood flow to your feet. Do not use heating pads or hot water bottles on your feet. They may burn your skin. If you have lost feeling in your feet or legs, you may not know this is happening until it is too late. Protect your feet from hot and cold by wearing shoes, such as at the beach or on hot pavement. Schedule a complete foot exam at least once a year (annually) or more often if you have foot problems. Report any cuts, sores, or bruises to your health care provider immediately. Where to find more information American Diabetes Association: www.diabetes.org Association of Diabetes Care & Education Specialists: www.diabeteseducator.org Contact a health care provider if: You have a medical condition that increases your risk of infection and you have any cuts, sores, or bruises on your feet. You have an injury that is not healing. You have redness on your legs or feet. You   feel burning or tingling in your legs or feet. You have pain or cramps in your legs and feet. Your legs or feet are numb. Your feet always feel cold. You have pain around any toenails. Get help right away if: You have a wound, scrape, corn, or callus on your foot and: You have pain, swelling, or redness that gets worse. You have fluid or blood coming from the wound, scrape, corn, or callus. Your wound, scrape, corn, or callus feels warm to the touch. You have pus or a bad smell coming from the wound, scrape, corn, or callus. You have a fever. You have a red  line going up your leg. Summary Check your feet every day for blisters, cuts, bruises, sores, and redness. Apply a moisturizing lotion or petroleum jelly to the skin on your feet and to dry, brittle toenails. Wear shoes that fit properly and have enough cushioning. If you have foot problems, report any cuts, sores, or bruises to your health care provider immediately. Schedule a complete foot exam at least once a year (annually) or more often if you have foot problems. This information is not intended to replace advice given to you by your health care provider. Make sure you discuss any questions you have with your health care provider. Document Revised: 12/03/2019 Document Reviewed: 12/03/2019 Elsevier Patient Education  2023 [employer]vier Inc.  

## 2021-10-02 NOTE — Progress Notes (Signed)
? ?                                                    Endocrinology follow-up note  ?     10/02/2021, 11:40 AM ? ? ?Subjective:  ? ? Patient ID: Kevin CanalesJerry Dunk Jr., male    DOB: 07/14/1980.  ?Kevin CanalesJerry Clavin Jr. is being seen in follow-up for management of currently uncontrolled symptomatic type 1 diabetes, hypothyroidism. ?PMD:   Default, Provider, MD. ? ? ?Past Medical History:  ?Diagnosis Date  ? Chronic right hip pain   ? Depression   ? Diabetes mellitus   ? diagnosed age 89  ? Seizures (HCC)   ? last one january 2016 due to low bs  ? Thyroid disease   ? ?Past Surgical History:  ?Procedure Laterality Date  ? EYE SURGERY Right age 89  ? ?Social History  ? ?Socioeconomic History  ? Marital status: Legally Separated  ?  Spouse name: Not on file  ? Number of children: Not on file  ? Years of education: Not on file  ? Highest education level: Not on file  ?Occupational History  ? Not on file  ?Tobacco Use  ? Smoking status: Former  ?  Packs/day: 2.00  ?  Years: 4.00  ?  Pack years: 8.00  ?  Types: Cigarettes  ?  Quit date: 05/29/2003  ?  Years since quitting: 18.3  ? Smokeless tobacco: Current  ?  Types: Chew  ?Vaping Use  ? Vaping Use: Never used  ?Substance and Sexual Activity  ? Alcohol use: No  ?  Comment: occassional rare beer or liqour  ? Drug use: No  ? Sexual activity: Yes  ?  Birth control/protection: Condom  ?Other Topics Concern  ? Not on file  ?Social History Narrative  ? Not on file  ? ?Social Determinants of Health  ? ?Financial Resource Strain: Not on file  ?Food Insecurity: Not on file  ?Transportation Needs: Not on file  ?Physical Activity: Not on file  ?Stress: Not on file  ?Social Connections: Not on file  ? ?Outpatient Encounter Medications as of 10/02/2021  ?Medication Sig  ? atorvastatin (LIPITOR) 20 MG tablet Take 1 tablet (20 mg total) by mouth daily.  ? Continuous Blood Gluc Receiver (DEXCOM G6 RECEIVER) DEVI Use to monitor glucose  ? Continuous Blood Gluc Sensor (DEXCOM G6 SENSOR) MISC  Change sensor every 10 days as directed  ? Continuous Blood Gluc Transmit (DEXCOM G6 TRANSMITTER) MISC Change transmitter every 90 days as directed.  ? FLUoxetine (PROZAC) 20 MG capsule Take 1 capsule (20 mg total) by mouth at bedtime.  ? GLOBAL EASE INJECT PEN NEEDLES 31G X 5 MM MISC Inject into the skin.  ? insulin lispro (HUMALOG) 100 UNIT/ML injection Use with pump for TDD around 50 units daily.  ? levothyroxine (SYNTHROID) 100 MCG tablet Take 1 tablet (100 mcg total) by mouth daily before breakfast.  ? Pancrelipase, Lip-Prot-Amyl, 24000-76000 units CPEP Take 2 capsules (48,000 Units total) by mouth 3 (three) times daily before meals. Take 1 tablet prior to snacks  ? pregabalin (LYRICA) 100 MG capsule Take 100 mg by mouth 3 (three) times daily.  ? Vitamin D, Ergocalciferol, (DRISDOL) 1.25 MG (50000 UNIT) CAPS capsule TAKE 1 CAPSULE BY MOUTH ONCE A WEEK.  ? [DISCONTINUED] gabapentin (NEURONTIN) 300 MG capsule Take 1  capsule (300 mg total) by mouth 2 (two) times daily.  ? [DISCONTINUED] levothyroxine (SYNTHROID) 100 MCG tablet Take 1 tablet (100 mcg total) by mouth daily before breakfast.  ? [DISCONTINUED] traMADol (ULTRAM) 50 MG tablet Take 50 mg by mouth 3 (three) times daily as needed.  ? ?No facility-administered encounter medications on file as of 10/02/2021.  ? ? ?ALLERGIES: ?Allergies  ?Allergen Reactions  ? Tomato Hives, Itching and Rash  ? Penicillins Other (See Comments) and Rash  ?  UNKNOWN REACTION  ? ? ?VACCINATION STATUS: ? ?There is no immunization history on file for this patient. ? ?Diabetes ?He presents for his follow-up diabetic visit. He has type 1 diabetes mellitus. Onset time: He was diagnosed at approximate age of 8 years. His disease course has been worsening. There are no hypoglycemic associated symptoms. Pertinent negatives for hypoglycemia include no confusion, dizziness, nervousness/anxiousness or pallor. Associated symptoms include fatigue. Pertinent negatives for diabetes include no  chest pain, no polydipsia, no polyphagia, no polyuria, no weakness and no weight loss. There are no hypoglycemic complications. Symptoms are stable. Diabetic complications include impotence and peripheral neuropathy. Risk factors for coronary artery disease include diabetes mellitus, tobacco exposure, sedentary lifestyle, family history, male sex and dyslipidemia. Current diabetic treatment includes insulin pump. He is compliant with treatment most of the time. His weight is fluctuating minimally. He is following a generally unhealthy diet. Meal planning includes carbohydrate counting and ADA exchanges. He has not had a previous visit with a dietitian. He rarely participates in exercise. His home blood glucose trend is increasing steadily. His breakfast blood glucose range is generally >200 mg/dl. His lunch blood glucose range is generally >200 mg/dl. His dinner blood glucose range is generally >200 mg/dl. His bedtime blood glucose range is generally >200 mg/dl. His overall blood glucose range is >200 mg/dl. (He presents today, accompanied by his wife, with his CGM and pump showing above target glycemic profile overall.  He was not due for another A1c today.  He did not reach out to [facility] for help getting his CGM communicating with his pump therefore his CGM and pump are still not communicating and he is still in manual mode and getting the full affect of his Omnipod.  Analysis of his CGM shows TIR 5%, TAR 95%, TBR 0% with a GMI of 11.5%.) An ACE inhibitor/angiotensin II receptor blocker is not being taken. He does not see a podiatrist.Eye exam is current.  ?Thyroid Problem ?Presents for follow-up visit. The condition has lasted for  years. Symptoms include diarrhea and fatigue. Patient reports no anxiety, cold intolerance, constipation, heat intolerance, palpitations, weight gain or weight loss. The symptoms have been stable. Past treatments include levothyroxine. The treatment provided mild relief. His past  medical history is significant for diabetes and hyperlipidemia. Risk factors include family history of hypothyroidism.  ?Erectile Dysfunction ?This is a chronic problem. The current episode started more than 1 year ago. The problem has been gradually worsening since onset. The nature of his difficulty is achieving erection and maintaining erection. He reports no anxiety or decreased libido. Irritative symptoms do not include urgency. Pertinent negatives include no chills, dysuria or hematuria. Past treatments include nothing. Risk factors include diabetes mellitus, penile trauma and tobacco use (was stepped on by a horse as a teenager in the groin causing trauma).  ?Hyperlipidemia ?This is a chronic problem. The current episode started more than 1 year ago. The problem is uncontrolled. Recent lipid tests were reviewed and are high. Exacerbating diseases include diabetes  and hypothyroidism. There are no known factors aggravating his hyperlipidemia. Pertinent negatives include no chest pain or myalgias. Current antihyperlipidemic treatment includes statins. There are no compliance problems.  Risk factors for coronary artery disease include diabetes mellitus, dyslipidemia, male sex and a sedentary lifestyle.  ? ? ?Review of systems ? ?Constitutional: + Minimally fluctuating body weight,  current Body mass index is 20.61 kg/m?. , no fatigue, no subjective hyperthermia, no subjective hypothermia ?Eyes: no blurry vision, no xerophthalmia ?ENT: no sore throat, no nodules palpated in throat, no dysphagia/odynophagia, no hoarseness ?Cardiovascular: no chest pain, no shortness of breath, no palpitations, no leg swelling ?Respiratory: no cough, no shortness of breath ?Gastrointestinal: no nausea/vomiting/diarrhea ?Musculoskeletal: no muscle/joint aches, walks with cane ?Skin: no rashes, no hyperemia, sees podiatrist who is recommending removing toenails in the near future ?Neurological: no tremors, no numbness, no tingling, no  dizziness ?Psychiatric: no depression, no anxiety ? ?Objective:  ?  ?BP 121/85   Pulse 89   Ht 5' 4.25" (1.632 m)   Wt 121 lb (54.9 kg)   BMI 20.61 kg/m?   ?Wt Readings from Last 3 Encounters:  ?10/02/21 121

## 2021-11-06 ENCOUNTER — Ambulatory Visit (INDEPENDENT_AMBULATORY_CARE_PROVIDER_SITE_OTHER): Payer: Medicare Other | Admitting: Nurse Practitioner

## 2021-11-06 ENCOUNTER — Encounter: Payer: Self-pay | Admitting: Nurse Practitioner

## 2021-11-06 ENCOUNTER — Ambulatory Visit: Payer: Medicare Other | Admitting: Nurse Practitioner

## 2021-11-06 VITALS — BP 118/75 | HR 61 | Ht 64.25 in | Wt 126.0 lb

## 2021-11-06 DIAGNOSIS — E039 Hypothyroidism, unspecified: Secondary | ICD-10-CM

## 2021-11-06 DIAGNOSIS — E782 Mixed hyperlipidemia: Secondary | ICD-10-CM | POA: Diagnosis not present

## 2021-11-06 DIAGNOSIS — E1065 Type 1 diabetes mellitus with hyperglycemia: Secondary | ICD-10-CM

## 2021-11-06 NOTE — Patient Instructions (Signed)

## 2021-11-06 NOTE — Progress Notes (Signed)
Endocrinology follow-up note       11/06/2021, 4:19 PM   Subjective:    Patient ID: Kevin CanalesJerry Penrose Jr., male    DOB: 09/05/1980.  Kevin CanalesJerry Reeves Jr. is being seen in follow-up for management of currently uncontrolled symptomatic type 1 diabetes, hypothyroidism. PMD:   Default, Provider, MD.   Past Medical History:  Diagnosis Date   Chronic right hip pain    Depression    Diabetes mellitus    diagnosed age 89   Seizures (HCC)    last one january 2016 due to low bs   Thyroid disease    Past Surgical History:  Procedure Laterality Date   EYE SURGERY Right age 89   Social History   Socioeconomic History   Marital status: Legally Separated    Spouse name: Not on file   Number of children: Not on file   Years of education: Not on file   Highest education level: Not on file  Occupational History   Not on file  Tobacco Use   Smoking status: Former    Packs/day: 2.00    Years: 4.00    Total pack years: 8.00    Types: Cigarettes    Quit date: 05/29/2003    Years since quitting: 18.4   Smokeless tobacco: Current    Types: Chew  Vaping Use   Vaping Use: Never used  Substance and Sexual Activity   Alcohol use: No    Comment: occassional rare beer or liqour   Drug use: No   Sexual activity: Yes    Birth control/protection: Condom  Other Topics Concern   Not on file  Social History Narrative   Not on file   Social Determinants of Health   Financial Resource Strain: Not on file  Food Insecurity: Not on file  Transportation Needs: Not on file  Physical Activity: Not on file  Stress: Not on file  Social Connections: Not on file   Outpatient Encounter Medications as of 11/06/2021  Medication Sig   atorvastatin (LIPITOR) 20 MG tablet Take 1 tablet (20 mg total) by mouth daily.   Continuous Blood Gluc Receiver (DEXCOM G6 RECEIVER) DEVI Use to monitor glucose   Continuous Blood Gluc Sensor (DEXCOM G6 SENSOR)  MISC Change sensor every 10 days as directed   Continuous Blood Gluc Transmit (DEXCOM G6 TRANSMITTER) MISC Change transmitter every 90 days as directed.   FLUoxetine (PROZAC) 20 MG capsule Take 1 capsule (20 mg total) by mouth at bedtime.   GLOBAL EASE INJECT PEN NEEDLES 31G X 5 MM MISC Inject into the skin.   insulin lispro (HUMALOG) 100 UNIT/ML injection Use with pump for TDD around 50 units daily.   levothyroxine (SYNTHROID) 100 MCG tablet Take 1 tablet (100 mcg total) by mouth daily before breakfast.   Pancrelipase, Lip-Prot-Amyl, 24000-76000 units CPEP Take 2 capsules (48,000 Units total) by mouth 3 (three) times daily before meals. Take 1 tablet prior to snacks   pregabalin (LYRICA) 100 MG capsule Take 100 mg by mouth 3 (three) times daily.   Vitamin D, Ergocalciferol, (DRISDOL) 1.25 MG (50000 UNIT) CAPS capsule TAKE 1 CAPSULE BY MOUTH ONCE A WEEK.   No facility-administered encounter medications on file as  of 11/06/2021.    ALLERGIES: Allergies  Allergen Reactions   Tomato Hives, Itching and Rash   Penicillins Other (See Comments) and Rash    UNKNOWN REACTION    VACCINATION STATUS:  There is no immunization history on file for this patient.  Diabetes He presents for his follow-up diabetic visit. He has type 1 diabetes mellitus. Onset time: He was diagnosed at approximate age of 8 years. His disease course has been improving. There are no hypoglycemic associated symptoms. Pertinent negatives for hypoglycemia include no confusion, dizziness, nervousness/anxiousness or pallor. Associated symptoms include fatigue. Pertinent negatives for diabetes include no chest pain, no polydipsia, no polyphagia, no polyuria, no weakness and no weight loss. There are no hypoglycemic complications. Symptoms are stable. Diabetic complications include impotence and peripheral neuropathy. Risk factors for coronary artery disease include diabetes mellitus, tobacco exposure, sedentary lifestyle, family  history, male sex and dyslipidemia. Current diabetic treatment includes insulin pump. He is compliant with treatment most of the time. His weight is fluctuating minimally. He is following a generally unhealthy diet. Meal planning includes carbohydrate counting and ADA exchanges. He has not had a previous visit with a dietitian. He rarely participates in exercise. His home blood glucose trend is decreasing steadily. His overall blood glucose range is 140-180 mg/dl. (He presents today, accompanied by his wife, with his Omnipod showing improving glycemic profile.  He notes his glucose is within good range when in automated mode which he has had intermittent trouble with from the start.  He was not due for another A1c today.  He denies any significant hypoglycemia.) An ACE inhibitor/angiotensin II receptor blocker is not being taken. He does not see a podiatrist.Eye exam is current.  Thyroid Problem Presents for follow-up visit. The condition has lasted for  years. Symptoms include diarrhea and fatigue. Patient reports no anxiety, cold intolerance, constipation, heat intolerance, palpitations, weight gain or weight loss. (Joint aches) The symptoms have been stable. Past treatments include levothyroxine. The treatment provided mild relief. His past medical history is significant for diabetes and hyperlipidemia. Risk factors include family history of hypothyroidism.  Erectile Dysfunction This is a chronic problem. The current episode started more than 1 year ago. The problem has been gradually worsening since onset. The nature of his difficulty is achieving erection and maintaining erection. He reports no anxiety or decreased libido. Irritative symptoms do not include urgency. Pertinent negatives include no chills, dysuria or hematuria. Past treatments include nothing. Risk factors include diabetes mellitus, penile trauma and tobacco use (was stepped on by a horse as a teenager in the groin causing trauma).   Hyperlipidemia This is a chronic problem. The current episode started more than 1 year ago. The problem is uncontrolled. Recent lipid tests were reviewed and are high. Exacerbating diseases include diabetes and hypothyroidism. There are no known factors aggravating his hyperlipidemia. Pertinent negatives include no chest pain or myalgias. Current antihyperlipidemic treatment includes statins. There are no compliance problems.  Risk factors for coronary artery disease include diabetes mellitus, dyslipidemia, male sex and a sedentary lifestyle.     Review of systems  Constitutional: + Minimally fluctuating body weight,  current Body mass index is 21.46 kg/m. , no fatigue, no subjective hyperthermia, no subjective hypothermia Eyes: no blurry vision, no xerophthalmia ENT: no sore throat, no nodules palpated in throat, no dysphagia/odynophagia, no hoarseness Cardiovascular: no chest pain, no shortness of breath, no palpitations, no leg swelling Respiratory: no cough, no shortness of breath Gastrointestinal: no nausea/vomiting/diarrhea Musculoskeletal: + generalized muscle/joint aches, walks  with cane Skin: no rashes, no hyperemia, sees podiatrist who is recommending removing toenails in the near future Neurological: no tremors, no numbness, no tingling, no dizziness Psychiatric: no depression, no anxiety  Objective:    BP 118/75   Pulse 61   Ht 5' 4.25" (1.632 m)   Wt 126 lb (57.2 kg)   BMI 21.46 kg/m   Wt Readings from Last 3 Encounters:  11/06/21 126 lb (57.2 kg)  10/02/21 121 lb (54.9 kg)  08/28/21 128 lb (58.1 kg)    BP Readings from Last 3 Encounters:  11/06/21 118/75  10/02/21 121/85  08/28/21 138/84     Physical Exam- Limited  Constitutional:  Body mass index is 21.46 kg/m. , not in acute distress, normal state of mind Eyes:  EOMI, no exophthalmos Neck: Supple Cardiovascular: RRR, no murmurs, rubs, or gallops, no edema Respiratory: Adequate breathing efforts, no  crackles, rales, rhonchi, or wheezing Musculoskeletal: no gross deformities, strength intact in all four extremities, no gross restriction of joint movements, walks with cane Skin:  no rashes, no hyperemia, extensive tattoos Neurological: no tremor with outstretched hands     CMP ( most recent) CMP     Component Value Date/Time   NA 136 02/02/2021 0935   K 4.4 02/02/2021 0935   CL 96 02/02/2021 0935   CO2 23 02/02/2021 0935   GLUCOSE 428 (H) 02/02/2021 0935   GLUCOSE 177 (H) 05/25/2020 0849   BUN 8 02/02/2021 0935   CREATININE 0.99 02/02/2021 0935   CREATININE 0.87 07/15/2018 1111   CALCIUM 9.7 02/02/2021 0935   PROT 7.3 02/02/2021 0935   ALBUMIN 5.2 (H) 02/02/2021 0935   AST 27 02/02/2021 0935   ALT 19 02/02/2021 0935   ALKPHOS 187 (H) 02/02/2021 0935   BILITOT 0.4 02/02/2021 0935   GFRNONAA >60 05/25/2020 0849   GFRNONAA 109 07/15/2018 1111   GFRAA >60 12/23/2019 1050   GFRAA 127 07/15/2018 1111    Diabetic Labs (most recent): Lab Results  Component Value Date   HGBA1C 10.7 08/28/2021   HGBA1C 9.2 (A) 03/01/2021   HGBA1C 9.7 (A) 07/25/2020   MICROALBUR 30 10/02/2021   MICROALBUR 7.3 (H) 12/23/2019   MICROALBUR 0.3 07/15/2018     Lab Results  Component Value Date   TSH 298.000 (H) 02/02/2021   TSH 263.000 (H) 07/19/2020   TSH 181.194 (H) 12/23/2019   TSH 14.02 (H) 07/15/2018   TSH 134.12 (H) 08/11/2015   TSH 32.191 (H) 10/30/2011   FREET4 0.21 (L) 02/02/2021   FREET4 0.23 (L) 07/19/2020   FREET4 0.37 (L) 12/23/2019   FREET4 1.3 07/15/2018   FREET4 0.45 (L) 11/01/2011    Lipid Panel     Component Value Date/Time   CHOL 183 05/25/2020 0849   TRIG 60 05/25/2020 0849   HDL 63 05/25/2020 0849   CHOLHDL 2.9 05/25/2020 0849   VLDL 12 05/25/2020 0849   LDLCALC 108 (H) 05/25/2020 0849   LDLCALC 63 07/15/2018 1111    Assessment & Plan:   1) Uncontrolled type 1 diabetes mellitus with hyperglycemia (HCC) - Kevin Canales. has currently uncontrolled  symptomatic type 1 DM since 41 years of age.  He presents today, accompanied by his wife, with his Omnipod showing improving glycemic profile.  He notes his glucose is within good range when in automated mode which he has had intermittent trouble with from the start.  He was not due for another A1c today.  He denies any significant hypoglycemia.  -his diabetes is complicated by  peripheral neuropathy, tobacco use/abuse and he remains at a high risk for more acute and chronic complications which include CAD, CVA, CKD, retinopathy, and neuropathy. These are all discussed in detail with him.  - Nutritional counseling repeated at each appointment due to patients tendency to fall back in to old habits.  - The patient admits there is a room for improvement in their diet and drink choices. -  Suggestion is made for the patient to avoid simple carbohydrates from their diet including Cakes, Sweet Desserts / Pastries, Ice Cream, Soda (diet and regular), Sweet Tea, Candies, Chips, Cookies, Sweet Pastries, Store Bought Juices, Alcohol in Excess of 1-2 drinks a day, Artificial Sweeteners, Coffee Creamer, and "Sugar-free" Products. This will help patient to have stable blood glucose profile and potentially avoid unintended weight gain.   - I encouraged the patient to switch to unprocessed or minimally processed complex starch and increased protein intake (animal or plant source), fruits, and vegetables.   - Patient is advised to stick to a routine mealtimes to eat 3 meals a day and avoid unnecessary snacks (to snack only to correct hypoglycemia).  - he has been scheduled with Norm Salt, RDN, CDE for individualized diabetes education previously.  - I have approached him with the following individualized plan to manage diabetes and patient agrees:   -Given his improved glycemic profile while in automated mode, no changes will be made to his pump settings today.  He will benefit most from using smartphone to  connect both his Dexcom and Omnipod optimally.  -He is advised to continue monitoring blood glucose at least 4 times a day, before meals and at bedtime.  He is also instructed to call the clinic if his blood glucose is less than 70 or greater than 200 for 3 tests in a row.  - he is warned not to take insulin without proper monitoring per orders.  -He is not a candidate for non-insulin medications for diabetes management.  - Patient specific target  A1c;  LDL, HDL, Triglycerides, and  Waist Circumference were discussed in detail.  2) Blood Pressure /Hypertension: His blood pressure is controlled to target.  He is not on any antihypertensives at this time. He will be considered for low dose ACE or ARB if BP is elevated on 3 separate visits.  3) Lipids/Hyperlipidemia:  His most recent lipid panel from 05/25/20 shows uncontrolled LDL at 106, slightly up from last visit.  He is advised to continue Lipitor 20 mg po daily at bedtime.  Side effects and precautions discussed with him.  Will recheck prior to next visit.  4)  Weight/Diet:  His Body mass index is 21.46 kg/m.  He is not a candidate for weight loss.  CDE Consult is in process. Exercise, and detailed carbohydrates information provided  -  detailed on discharge instructions.  5) Hypothyroidism He is advised to continue Levothyroxine 100 mcg po daily before breakfast for now.  Will repeat thyroid function tests prior to next visit and adjust dose if needed.  - We discussed about the correct intake of his thyroid hormone, on empty stomach at fasting, with water, separated by at least 30 minutes from breakfast and other medications,  and separated by more than 4 hours from calcium, iron, multivitamins, acid reflux medications (PPIs). -Patient is made aware of the fact that thyroid hormone replacement is needed for life, dose to be adjusted by periodic monitoring of thyroid function tests.  6) Vitamin D deficiency: His recent vitamin D level  from  02/02/21 was low at 11.1.  He has finished Ergocalciferol 50000 units weekly x 12 weeks.  7) Chronic Care/Health Maintenance: -he is not on ACEI/ARB and is on Statin medications and is encouraged to initiate and continue to follow up with Ophthalmology, Dentist,  Podiatrist at least yearly or according to recommendations, and advised to quit tobacco use/abuse. I have recommended yearly flu vaccine and pneumonia vaccine at least every 5 years; moderate intensity exercise for up to 150 minutes weekly; and  sleep for at least 7 hours a day.  - he is advised to maintain close follow up with Default, Provider, MD for primary care needs, as well as his other providers for optimal and coordinated care.       I spent 42 minutes in the care of the patient today including review of labs from CMP, Lipids, Thyroid Function, Hematology (current and previous including abstractions from other facilities); face-to-face time discussing  his blood glucose readings/logs, discussing hypoglycemia and hyperglycemia episodes and symptoms, medications doses, his options of short and long term treatment based on the latest standards of care / guidelines;  discussion about incorporating lifestyle medicine;  and documenting the encounter.    Please refer to Patient Instructions for Blood Glucose Monitoring and Insulin/Medications Dosing Guide"  in media tab for additional information. Please  also refer to " Patient Self Inventory" in the Media  tab for reviewed elements of pertinent patient history.  Kevin Canales. participated in the discussions, expressed understanding, and voiced agreement with the above plans.  All questions were answered to his satisfaction. he is encouraged to contact clinic should he have any questions or concerns prior to his return visit.     Follow up plan: - Return in about 3 months (around 02/06/2022) for Diabetes F/U with A1c in office, Previsit labs, Thyroid follow up, Bring meter and  logs.  Ronny Bacon, [facility] [address] [place], [place] [address] Phone: [phone] Fax: [phone]   11/06/2021, 4:19 PM

## 2021-11-14 ENCOUNTER — Other Ambulatory Visit: Payer: Self-pay | Admitting: Nurse Practitioner

## 2021-12-07 DIAGNOSIS — E1040 Type 1 diabetes mellitus with diabetic neuropathy, unspecified: Secondary | ICD-10-CM | POA: Diagnosis not present

## 2021-12-07 DIAGNOSIS — L853 Xerosis cutis: Secondary | ICD-10-CM | POA: Diagnosis not present

## 2021-12-19 DIAGNOSIS — Z299 Encounter for prophylactic measures, unspecified: Secondary | ICD-10-CM | POA: Diagnosis not present

## 2021-12-19 DIAGNOSIS — E1165 Type 2 diabetes mellitus with hyperglycemia: Secondary | ICD-10-CM | POA: Diagnosis not present

## 2021-12-19 DIAGNOSIS — M79644 Pain in right finger(s): Secondary | ICD-10-CM | POA: Diagnosis not present

## 2021-12-19 DIAGNOSIS — F17210 Nicotine dependence, cigarettes, uncomplicated: Secondary | ICD-10-CM | POA: Diagnosis not present

## 2021-12-19 DIAGNOSIS — E039 Hypothyroidism, unspecified: Secondary | ICD-10-CM | POA: Diagnosis not present

## 2021-12-19 DIAGNOSIS — R7989 Other specified abnormal findings of blood chemistry: Secondary | ICD-10-CM | POA: Diagnosis not present

## 2021-12-21 DIAGNOSIS — M79641 Pain in right hand: Secondary | ICD-10-CM | POA: Diagnosis not present

## 2021-12-21 DIAGNOSIS — G629 Polyneuropathy, unspecified: Secondary | ICD-10-CM | POA: Diagnosis not present

## 2021-12-21 DIAGNOSIS — R7989 Other specified abnormal findings of blood chemistry: Secondary | ICD-10-CM | POA: Diagnosis not present

## 2021-12-21 DIAGNOSIS — E039 Hypothyroidism, unspecified: Secondary | ICD-10-CM | POA: Diagnosis not present

## 2021-12-21 DIAGNOSIS — S6991XA Unspecified injury of right wrist, hand and finger(s), initial encounter: Secondary | ICD-10-CM | POA: Diagnosis not present

## 2021-12-21 DIAGNOSIS — M79644 Pain in right finger(s): Secondary | ICD-10-CM | POA: Diagnosis not present

## 2022-01-02 ENCOUNTER — Telehealth: Payer: Self-pay | Admitting: Nurse Practitioner

## 2022-01-02 NOTE — Telephone Encounter (Signed)
No, he wont need to repeat his labs this time.  I just wish PCP would let me manage the diabetes and thyroid problems that way he is getting the consistency he needs.

## 2022-01-02 NOTE — Telephone Encounter (Signed)
Pt's wife left a VM stating he needed to be seen sooner than 9/12. I tried to call pt no answer no VM, if he calls back, see why does he need to be seen sooner because the patient will need fasting labs as well to be seen.

## 2022-01-02 NOTE — Telephone Encounter (Signed)
Patient called and said he did labs with his PCP and they increased his synthroid. Please advise if you will use these labs or do you want me to go back to the lab and do what you ordered as well. Please advise  [phone]

## 2022-01-02 NOTE — Telephone Encounter (Signed)
Patient's wife made aware.

## 2022-01-03 ENCOUNTER — Other Ambulatory Visit: Payer: Self-pay | Admitting: Nurse Practitioner

## 2022-01-11 ENCOUNTER — Telehealth: Payer: Self-pay | Admitting: Nurse Practitioner

## 2022-01-11 NOTE — Telephone Encounter (Signed)
No, unfortunately we dont.  We dont have any G6 supplies any more

## 2022-01-11 NOTE — Telephone Encounter (Signed)
Called the number his wife gave me, it was someone else on the VM , So I did not leave a VM - verify #

## 2022-01-11 NOTE — Telephone Encounter (Signed)
Pt called back and will call medicaid and see if they will override a new one.

## 2022-01-11 NOTE — Telephone Encounter (Signed)
Pt has misplaced his transmitter. Do you have a spare? [phone]

## 2022-02-02 DIAGNOSIS — F339 Major depressive disorder, recurrent, unspecified: Secondary | ICD-10-CM | POA: Diagnosis not present

## 2022-02-02 DIAGNOSIS — R2681 Unsteadiness on feet: Secondary | ICD-10-CM | POA: Diagnosis not present

## 2022-02-02 DIAGNOSIS — E1140 Type 2 diabetes mellitus with diabetic neuropathy, unspecified: Secondary | ICD-10-CM | POA: Diagnosis not present

## 2022-02-02 DIAGNOSIS — E109 Type 1 diabetes mellitus without complications: Secondary | ICD-10-CM | POA: Diagnosis not present

## 2022-02-02 DIAGNOSIS — F17210 Nicotine dependence, cigarettes, uncomplicated: Secondary | ICD-10-CM | POA: Diagnosis not present

## 2022-02-02 DIAGNOSIS — Z299 Encounter for prophylactic measures, unspecified: Secondary | ICD-10-CM | POA: Diagnosis not present

## 2022-02-06 ENCOUNTER — Ambulatory Visit: Payer: Medicare Other | Admitting: Nurse Practitioner

## 2022-02-07 ENCOUNTER — Ambulatory Visit (INDEPENDENT_AMBULATORY_CARE_PROVIDER_SITE_OTHER): Payer: Medicare Other | Admitting: Nurse Practitioner

## 2022-02-07 ENCOUNTER — Encounter: Payer: Self-pay | Admitting: Nurse Practitioner

## 2022-02-07 VITALS — BP 100/72 | HR 72 | Ht 64.25 in | Wt 120.2 lb

## 2022-02-07 DIAGNOSIS — E039 Hypothyroidism, unspecified: Secondary | ICD-10-CM | POA: Diagnosis not present

## 2022-02-07 DIAGNOSIS — E1065 Type 1 diabetes mellitus with hyperglycemia: Secondary | ICD-10-CM

## 2022-02-07 DIAGNOSIS — E782 Mixed hyperlipidemia: Secondary | ICD-10-CM

## 2022-02-07 LAB — POCT GLYCOSYLATED HEMOGLOBIN (HGB A1C): Hemoglobin A1C: 9.2 % — AB (ref 4.0–5.6)

## 2022-02-07 MED ORDER — PANCRELIPASE (LIP-PROT-AMYL) 25000-79000 UNITS PO CPEP: ORAL_CAPSULE | ORAL | 3 refills | Status: DC

## 2022-02-07 NOTE — Patient Instructions (Signed)

## 2022-02-07 NOTE — Progress Notes (Signed)
Endocrinology follow-up note       02/07/2022, 3:23 PM   Subjective:    Patient ID: Kevin CanalesJerry Kettering Jr., male    DOB: 07/04/1980.  Kevin CanalesJerry Verley Jr. is being seen in follow-up for management of currently uncontrolled symptomatic type 1 diabetes, hypothyroidism. PMD:   Default, Provider, MD.   Past Medical History:  Diagnosis Date   Chronic right hip pain    Depression    Diabetes mellitus    diagnosed age 41   Seizures (HCC)    last one january 2016 due to low bs   Thyroid disease    Past Surgical History:  Procedure Laterality Date   EYE SURGERY Right age 41   Social History   Socioeconomic History   Marital status: Legally Separated    Spouse name: Not on file   Number of children: Not on file   Years of education: Not on file   Highest education level: Not on file  Occupational History   Not on file  Tobacco Use   Smoking status: Former    Packs/day: 2.00    Years: 4.00    Total pack years: 8.00    Types: Cigarettes    Quit date: 05/29/2003    Years since quitting: 18.7   Smokeless tobacco: Current    Types: Chew  Vaping Use   Vaping Use: Never used  Substance and Sexual Activity   Alcohol use: No    Comment: occassional rare beer or liqour   Drug use: No   Sexual activity: Yes    Birth control/protection: Condom  Other Topics Concern   Not on file  Social History Narrative   Not on file   Social Determinants of Health   Financial Resource Strain: Not on file  Food Insecurity: Not on file  Transportation Needs: Not on file  Physical Activity: Not on file  Stress: Not on file  Social Connections: Not on file   Outpatient Encounter Medications as of 02/07/2022  Medication Sig   atorvastatin (LIPITOR) 20 MG tablet Take 1 tablet (20 mg total) by mouth daily.   Continuous Blood Gluc Receiver (DEXCOM G6 RECEIVER) DEVI Use to monitor glucose   Continuous Blood Gluc Sensor (DEXCOM G6 SENSOR)  MISC Change sensor every 10 days as directed   Continuous Blood Gluc Transmit (DEXCOM G6 TRANSMITTER) MISC Change transmitter every 90 days as directed.   FLUoxetine (PROZAC) 20 MG capsule Take 1 capsule (20 mg total) by mouth at bedtime.   insulin lispro (HUMALOG) 100 UNIT/ML injection Use with pump for TDD around 50 units daily.   levothyroxine (SYNTHROID) 100 MCG tablet Take 1 tablet (100 mcg total) by mouth daily before breakfast.   lipase/protease/amylase 25000-79000 units CPEP Take 2 capsules before breakfast, lunch, and supper.  Take 1 tab prior to snacks.   pregabalin (LYRICA) 100 MG capsule Take 100 mg by mouth 3 (three) times daily.   Vitamin D, Ergocalciferol, (DRISDOL) 1.25 MG (50000 UNIT) CAPS capsule TAKE 1 CAPSULE BY MOUTH ONCE A WEEK   GLOBAL EASE INJECT PEN NEEDLES 31G X 5 MM MISC Inject into the skin. (Patient not taking: Reported on 02/07/2022)   [DISCONTINUED] Pancrelipase, Lip-Prot-Amyl, 24000-76000 units CPEP Take  2 capsules (48,000 Units total) by mouth 3 (three) times daily before meals. Take 1 tablet prior to snacks (Patient not taking: Reported on 02/07/2022)   No facility-administered encounter medications on file as of 02/07/2022.    ALLERGIES: Allergies  Allergen Reactions   Tomato Hives, Itching and Rash   Penicillins Other (See Comments) and Rash    UNKNOWN REACTION    VACCINATION STATUS:  There is no immunization history on file for this patient.  Diabetes He presents for his follow-up diabetic visit. He has type 1 diabetes mellitus. Onset time: He was diagnosed at approximate age of 8 years. His disease course has been fluctuating. There are no hypoglycemic associated symptoms. Pertinent negatives for hypoglycemia include no confusion, dizziness, nervousness/anxiousness or pallor. Associated symptoms include fatigue. Pertinent negatives for diabetes include no chest pain, no polydipsia, no polyphagia, no polyuria, no weakness and no weight loss. There are no  hypoglycemic complications. Symptoms are stable. Diabetic complications include impotence and peripheral neuropathy. Risk factors for coronary artery disease include diabetes mellitus, tobacco exposure, sedentary lifestyle, family history, male sex and dyslipidemia. Current diabetic treatment includes insulin pump. He is compliant with treatment most of the time. His weight is decreasing steadily. He is following a generally unhealthy diet. Meal planning includes carbohydrate counting and ADA exchanges. He has not had a previous visit with a dietitian. He rarely participates in exercise. His home blood glucose trend is fluctuating dramatically. His overall blood glucose range is >200 mg/dl. (He presents today, accompanied by his wife, with his Omnipod showing gross hyperglycemia overall.  POCT A1c today is 9.2%, improving from last visit of 10.7%.  He has now gotten his Dexcom and Omnipod communicating properly but notes his Omnipod changes to manual mode quite often.  After reviewing his report, looks like it may be due to the pump reaching max basal dosing during the day.  This may be as a result of not bolusing before meals.  Analysis of his CGM shows TIR 17%, TAR 73%, TBR 0% with a GMI of 10.1%.) An ACE inhibitor/angiotensin II receptor blocker is not being taken. He does not see a podiatrist.Eye exam is current.  Thyroid Problem Presents for follow-up visit. The condition has lasted for  years. Symptoms include diarrhea and fatigue. Patient reports no anxiety, cold intolerance, constipation, heat intolerance, palpitations, weight gain or weight loss. (Joint aches) The symptoms have been stable. Past treatments include levothyroxine. The treatment provided mild relief. His past medical history is significant for diabetes and hyperlipidemia. Risk factors include family history of hypothyroidism.  Erectile Dysfunction This is a chronic problem. The current episode started more than 1 year ago. The problem has  been gradually worsening since onset. The nature of his difficulty is achieving erection and maintaining erection. He reports no anxiety or decreased libido. Irritative symptoms do not include urgency. Pertinent negatives include no chills, dysuria or hematuria. Past treatments include nothing. Risk factors include diabetes mellitus, penile trauma and tobacco use (was stepped on by a horse as a teenager in the groin causing trauma).  Hyperlipidemia This is a chronic problem. The current episode started more than 1 year ago. The problem is uncontrolled. Recent lipid tests were reviewed and are high. Exacerbating diseases include diabetes and hypothyroidism. There are no known factors aggravating his hyperlipidemia. Pertinent negatives include no chest pain or myalgias. Current antihyperlipidemic treatment includes statins. There are no compliance problems.  Risk factors for coronary artery disease include diabetes mellitus, dyslipidemia, male sex and a sedentary lifestyle.  Review of systems  Constitutional: + decreasing body weight,  current Body mass index is 20.47 kg/m. , no fatigue, no subjective hyperthermia, no subjective hypothermia Eyes: no blurry vision, no xerophthalmia ENT: no sore throat, no nodules palpated in throat, no dysphagia/odynophagia, no hoarseness Cardiovascular: no chest pain, no shortness of breath, no palpitations, no leg swelling Respiratory: no cough, no shortness of breath Gastrointestinal: no nausea/vomiting, +diarrhea Musculoskeletal: + generalized muscle/joint aches, walks with cane Skin: no rashes, no hyperemia, sees podiatrist who is recommending removing toenails in the near future Neurological: no tremors, no numbness, no tingling, no dizziness Psychiatric: no depression, no anxiety, reports increased stress lately  Objective:    BP 100/72 (BP Location: Left Arm, Patient Position: Sitting, Cuff Size: Normal)   Pulse 72   Ht 5' 4.25" (1.632 m)   Wt 120 lb  3.2 oz (54.5 kg)   BMI 20.47 kg/m   Wt Readings from Last 3 Encounters:  02/07/22 120 lb 3.2 oz (54.5 kg)  11/06/21 126 lb (57.2 kg)  10/02/21 121 lb (54.9 kg)    BP Readings from Last 3 Encounters:  02/07/22 100/72  11/06/21 118/75  10/02/21 121/85     Physical Exam- Limited  Constitutional:  Body mass index is 20.47 kg/m. , not in acute distress, normal state of mind Eyes:  EOMI, no exophthalmos Neck: Supple Cardiovascular: RRR, no murmurs, rubs, or gallops, no edema Respiratory: Adequate breathing efforts, no crackles, rales, rhonchi, or wheezing Musculoskeletal: no gross deformities, strength intact in all four extremities, no gross restriction of joint movements, walks with cane Skin:  no rashes, no hyperemia, extensive tattoos Neurological: no tremor with outstretched hands     CMP ( most recent) CMP     Component Value Date/Time   NA 136 02/02/2021 0935   K 4.4 02/02/2021 0935   CL 96 02/02/2021 0935   CO2 23 02/02/2021 0935   GLUCOSE 428 (H) 02/02/2021 0935   GLUCOSE 177 (H) 05/25/2020 0849   BUN 8 02/02/2021 0935   CREATININE 0.99 02/02/2021 0935   CREATININE 0.87 07/15/2018 1111   CALCIUM 9.7 02/02/2021 0935   PROT 7.3 02/02/2021 0935   ALBUMIN 5.2 (H) 02/02/2021 0935   AST 27 02/02/2021 0935   ALT 19 02/02/2021 0935   ALKPHOS 187 (H) 02/02/2021 0935   BILITOT 0.4 02/02/2021 0935   GFRNONAA >60 05/25/2020 0849   GFRNONAA 109 07/15/2018 1111   GFRAA >60 12/23/2019 1050   GFRAA 127 07/15/2018 1111    Diabetic Labs (most recent): Lab Results  Component Value Date   HGBA1C 9.2 (A) 02/07/2022   HGBA1C 10.7 08/28/2021   HGBA1C 9.2 (A) 03/01/2021   MICROALBUR 30 10/02/2021   MICROALBUR 7.3 (H) 12/23/2019   MICROALBUR 0.3 07/15/2018     Lab Results  Component Value Date   TSH 298.000 (H) 02/02/2021   TSH 263.000 (H) 07/19/2020   TSH 181.194 (H) 12/23/2019   TSH 14.02 (H) 07/15/2018   TSH 134.12 (H) 08/11/2015   TSH 32.191 (H) 10/30/2011    FREET4 0.21 (L) 02/02/2021   FREET4 0.23 (L) 07/19/2020   FREET4 0.37 (L) 12/23/2019   FREET4 1.3 07/15/2018   FREET4 0.45 (L) 11/01/2011    Lipid Panel     Component Value Date/Time   CHOL 183 05/25/2020 0849   TRIG 60 05/25/2020 0849   HDL 63 05/25/2020 0849   CHOLHDL 2.9 05/25/2020 0849   VLDL 12 05/25/2020 0849   LDLCALC 108 (H) 05/25/2020 0849   LDLCALC 63 07/15/2018  1111    Assessment & Plan:   1) Uncontrolled type 1 diabetes mellitus with hyperglycemia (HCC) - Kevin Cooper. has currently uncontrolled symptomatic type 1 DM since 41 years of age.  He presents today, accompanied by his wife, with his Omnipod showing gross hyperglycemia overall.  POCT A1c today is 9.2%, improving from last visit of 10.7%.  He has now gotten his Dexcom and Omnipod communicating properly but notes his Omnipod changes to manual mode quite often.  After reviewing his report, looks like it may be due to the pump reaching max basal dosing during the day.  This may be as a result of not bolusing before meals.  Analysis of his CGM shows TIR 17%, TAR 73%, TBR 0% with a GMI of 10.1%.  -his diabetes is complicated by peripheral neuropathy, tobacco use/abuse and he remains at a high risk for more acute and chronic complications which include CAD, CVA, CKD, retinopathy, and neuropathy. These are all discussed in detail with him.  - Nutritional counseling repeated at each appointment due to patients tendency to fall back in to old habits.  - The patient admits there is a room for improvement in their diet and drink choices. -  Suggestion is made for the patient to avoid simple carbohydrates from their diet including Cakes, Sweet Desserts / Pastries, Ice Cream, Soda (diet and regular), Sweet Tea, Candies, Chips, Cookies, Sweet Pastries, Store Bought Juices, Alcohol in Excess of 1-2 drinks a day, Artificial Sweeteners, Coffee Creamer, and "Sugar-free" Products. This will help patient to have stable blood glucose  profile and potentially avoid unintended weight gain.   - I encouraged the patient to switch to unprocessed or minimally processed complex starch and increased protein intake (animal or plant source), fruits, and vegetables.   - Patient is advised to stick to a routine mealtimes to eat 3 meals a day and avoid unnecessary snacks (to snack only to correct hypoglycemia).  - he has been scheduled with Norm Salt, RDN, CDE for individualized diabetes education previously.  - I have approached him with the following individualized plan to manage diabetes and patient agrees:   -No changes will be made to his Omnipod settings today.  He is encouraged to start bolusing before meals and to stay in automatic mode as much as possible.  -He is advised to continue monitoring blood glucose at least 4 times a day, before meals and at bedtime.  He is also instructed to call the clinic if his blood glucose is less than 70 or greater than 200 for 3 tests in a row.  - he is warned not to take insulin without proper monitoring per orders.  -He is not a candidate for non-insulin medications for diabetes management.  - Patient specific target  A1c;  LDL, HDL, Triglycerides, and  Waist Circumference were discussed in detail.  2) Blood Pressure /Hypertension: His blood pressure is controlled to target.  He is not on any antihypertensives at this time. He will be considered for low dose ACE or ARB if BP is elevated on 3 separate visits.  3) Lipids/Hyperlipidemia:  His most recent lipid panel from 05/25/20 shows uncontrolled LDL at 106, slightly up from last visit.  He is advised to continue Lipitor 20 mg po daily at bedtime.  Side effects and precautions discussed with him.  Will recheck prior to next visit.  4)  Weight/Diet:  His Body mass index is 20.47 kg/m.  He is not a candidate for weight loss.  CDE Consult  is in process. Exercise, and detailed carbohydrates information provided  -  detailed on discharge  instructions.  5) Hypothyroidism He is advised to continue Levothyroxine 112 mcg po daily before breakfast for now.  Will repeat thyroid function tests today and adjust further if needed.  - We discussed about the correct intake of his thyroid hormone, on empty stomach at fasting, with water, separated by at least 30 minutes from breakfast and other medications,  and separated by more than 4 hours from calcium, iron, multivitamins, acid reflux medications (PPIs). -Patient is made aware of the fact that thyroid hormone replacement is needed for life, dose to be adjusted by periodic monitoring of thyroid function tests.  6) Vitamin D deficiency: His recent vitamin D level from 02/02/21 was low at 11.1.  He has finished Ergocalciferol 50000 units weekly x 12 weeks.  7) Chronic Care/Health Maintenance: -he is not on ACEI/ARB and is on Statin medications and is encouraged to initiate and continue to follow up with Ophthalmology, Dentist,  Podiatrist at least yearly or according to recommendations, and advised to quit tobacco use/abuse. I have recommended yearly flu vaccine and pneumonia vaccine at least every 5 years; moderate intensity exercise for up to 150 minutes weekly; and  sleep for at least 7 hours a day.  - he is advised to maintain close follow up with Default, Provider, MD for primary care needs, as well as his other providers for optimal and coordinated care.   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  He notes the Creon he was on previously did not help much.  I asked that he try it again and sent in new script to his pharmacy.      I spent 42 minutes in the care of the patient today including review of labs from CMP, Lipids, Thyroid Function, Hematology (current and previous including abstractions from other facilities); face-to-face time discussing  his blood glucose readings/logs, discussing hypoglycemia and  hyperglycemia episodes and symptoms, medications doses, his options of short and long term treatment based on the latest standards of care / guidelines;  discussion about incorporating lifestyle medicine;  and documenting the encounter. Risk reduction counseling performed per USPSTF guidelines to reduce obesity and cardiovascular risk factors.     Please refer to Patient Instructions for Blood Glucose Monitoring and Insulin/Medications Dosing Guide"  in media tab for additional information. Please  also refer to " Patient Self Inventory" in the Media  tab for reviewed elements of pertinent patient history.  Kevin Cooper. participated in the discussions, expressed understanding, and voiced agreement with the above plans.  All questions were answered to his satisfaction. he is encouraged to contact clinic should he have any questions or concerns prior to his return visit.     Follow up plan: - Return in 3 months (on 05/09/2022) for Thyroid follow up, Diabetes F/U with A1c in office, Bring meter and logs, Previsit labs.  Ronny Bacon, [facility] [address] [place], [place] [address] Phone: [phone] Fax: [phone]   02/07/2022, 3:23 PM

## 2022-02-27 DIAGNOSIS — E039 Hypothyroidism, unspecified: Secondary | ICD-10-CM | POA: Diagnosis not present

## 2022-02-28 ENCOUNTER — Encounter: Payer: Self-pay | Admitting: *Deleted

## 2022-02-28 LAB — TSH: TSH: 220 u[IU]/mL — ABNORMAL HIGH (ref 0.450–4.500)

## 2022-02-28 LAB — T4, FREE: Free T4: 0.2 ng/dL — ABNORMAL LOW (ref 0.82–1.77)

## 2022-02-28 MED ORDER — LEVOTHYROXINE SODIUM 150 MCG PO TABS: 150.0000 ug | ORAL_TABLET | Freq: Every day | ORAL | 1 refills | Status: DC

## 2022-02-28 NOTE — Progress Notes (Signed)
His thyroid labs are still suggestive of under-replacement.  He does have significant absorption issues.  Will need to increase his dose of Levothyroxine to 150 mcg po daily before breakfast and repeat thyroid labs in about 6 weeks.  I will send in new orders for thyroid labs and his prescription to the pharmacy too.

## 2022-02-28 NOTE — Addendum Note (Signed)
Addended by: Brita Romp on: 02/28/2022 07:38 AM   Modules accepted: Orders

## 2022-02-28 NOTE — Progress Notes (Signed)
I have tried calling the patient at the number on his information. This is a business phone. I have sent him a message , asking that he call our office for results, if I am busy to give a good call back number so that I can call him back.

## 2022-03-05 DIAGNOSIS — F17210 Nicotine dependence, cigarettes, uncomplicated: Secondary | ICD-10-CM | POA: Diagnosis not present

## 2022-03-05 DIAGNOSIS — Z23 Encounter for immunization: Secondary | ICD-10-CM | POA: Diagnosis not present

## 2022-03-05 DIAGNOSIS — F339 Major depressive disorder, recurrent, unspecified: Secondary | ICD-10-CM | POA: Diagnosis not present

## 2022-03-05 DIAGNOSIS — E109 Type 1 diabetes mellitus without complications: Secondary | ICD-10-CM | POA: Diagnosis not present

## 2022-03-05 DIAGNOSIS — Z299 Encounter for prophylactic measures, unspecified: Secondary | ICD-10-CM | POA: Diagnosis not present

## 2022-03-06 ENCOUNTER — Encounter: Payer: Self-pay | Admitting: *Deleted

## 2022-03-06 NOTE — Progress Notes (Signed)
Unable to reach the patient by the telephone numbers in his chart. I had sent a My Chart message to him on October 4,2023. No response. I have sent the patient a letter , asking that he please call our office for results.

## 2022-03-07 ENCOUNTER — Telehealth: Payer: Self-pay | Admitting: *Deleted

## 2022-03-07 NOTE — Telephone Encounter (Signed)
-----   Message from Brita Romp, NP sent at 02/28/2022  7:38 AM EDT ----- His thyroid labs are still suggestive of under-replacement.  He does have significant absorption issues.  Will need to increase his dose of Levothyroxine to 150 mcg po daily before breakfast and repeat thyroid labs in about 6 weeks.  I will send in n ew orders for thyroid labs and his prescription to the pharmacy too.

## 2022-03-07 NOTE — Telephone Encounter (Signed)
A letter has been mailed to the patient. Asking that he call the office for reults , unable to get on the phone.

## 2022-03-13 NOTE — Telephone Encounter (Signed)
F/u    Calling back for test results  

## 2022-03-16 ENCOUNTER — Telehealth: Payer: Self-pay | Admitting: Nurse Practitioner

## 2022-03-16 NOTE — Telephone Encounter (Signed)
Talked with the patient. Results and recommendation given. He will get his medication and in 6 weeks repeat lab work. Pharmacy is still [facility] in [place].

## 2022-03-16 NOTE — Progress Notes (Signed)
Talked with the patient. Results and recommendation given. He will get his medication and in 6 weeks repeat lab work. Pharmacy is still [facility] in [place].  

## 2022-03-16 NOTE — Telephone Encounter (Signed)
-----   Message from Whitney J Reardon, NP sent at 02/28/2022  7:38 AM EDT ----- His thyroid labs are still suggestive of under-replacement.  He does have significant absorption issues.  Will need to increase his dose of Levothyroxine to 150 mcg po daily before breakfast and repeat thyroid labs in about 6 weeks.  I will send in n ew orders for thyroid labs and his prescription to the pharmacy too. 

## 2022-03-16 NOTE — Telephone Encounter (Signed)
New message    Calling for test results  

## 2022-04-05 DIAGNOSIS — Z299 Encounter for prophylactic measures, unspecified: Secondary | ICD-10-CM | POA: Diagnosis not present

## 2022-04-05 DIAGNOSIS — F339 Major depressive disorder, recurrent, unspecified: Secondary | ICD-10-CM | POA: Diagnosis not present

## 2022-04-05 DIAGNOSIS — E1165 Type 2 diabetes mellitus with hyperglycemia: Secondary | ICD-10-CM | POA: Diagnosis not present

## 2022-04-18 DIAGNOSIS — E1140 Type 2 diabetes mellitus with diabetic neuropathy, unspecified: Secondary | ICD-10-CM | POA: Diagnosis not present

## 2022-04-18 DIAGNOSIS — Z299 Encounter for prophylactic measures, unspecified: Secondary | ICD-10-CM | POA: Diagnosis not present

## 2022-04-18 DIAGNOSIS — E1065 Type 1 diabetes mellitus with hyperglycemia: Secondary | ICD-10-CM | POA: Diagnosis not present

## 2022-04-23 DIAGNOSIS — Z7189 Other specified counseling: Secondary | ICD-10-CM | POA: Diagnosis not present

## 2022-04-23 DIAGNOSIS — Z1339 Encounter for screening examination for other mental health and behavioral disorders: Secondary | ICD-10-CM | POA: Diagnosis not present

## 2022-04-23 DIAGNOSIS — N529 Male erectile dysfunction, unspecified: Secondary | ICD-10-CM | POA: Diagnosis not present

## 2022-04-23 DIAGNOSIS — Z6822 Body mass index (BMI) 22.0-22.9, adult: Secondary | ICD-10-CM | POA: Diagnosis not present

## 2022-04-23 DIAGNOSIS — Z1331 Encounter for screening for depression: Secondary | ICD-10-CM | POA: Diagnosis not present

## 2022-04-23 DIAGNOSIS — Z299 Encounter for prophylactic measures, unspecified: Secondary | ICD-10-CM | POA: Diagnosis not present

## 2022-04-23 DIAGNOSIS — Z0000 Encounter for general adult medical examination without abnormal findings: Secondary | ICD-10-CM | POA: Diagnosis not present

## 2022-04-23 DIAGNOSIS — Z79899 Other long term (current) drug therapy: Secondary | ICD-10-CM | POA: Diagnosis not present

## 2022-04-23 DIAGNOSIS — E039 Hypothyroidism, unspecified: Secondary | ICD-10-CM | POA: Diagnosis not present

## 2022-05-02 ENCOUNTER — Ambulatory Visit: Payer: Medicare Other | Admitting: Podiatry

## 2022-05-10 ENCOUNTER — Ambulatory Visit: Payer: Medicare Other | Admitting: Nurse Practitioner

## 2022-05-10 DIAGNOSIS — E1065 Type 1 diabetes mellitus with hyperglycemia: Secondary | ICD-10-CM

## 2022-05-10 DIAGNOSIS — E039 Hypothyroidism, unspecified: Secondary | ICD-10-CM

## 2022-05-10 DIAGNOSIS — E782 Mixed hyperlipidemia: Secondary | ICD-10-CM

## 2022-05-14 DIAGNOSIS — E1140 Type 2 diabetes mellitus with diabetic neuropathy, unspecified: Secondary | ICD-10-CM | POA: Diagnosis not present

## 2022-05-14 DIAGNOSIS — Z299 Encounter for prophylactic measures, unspecified: Secondary | ICD-10-CM | POA: Diagnosis not present

## 2022-05-14 DIAGNOSIS — F419 Anxiety disorder, unspecified: Secondary | ICD-10-CM | POA: Diagnosis not present

## 2022-05-29 ENCOUNTER — Other Ambulatory Visit: Payer: Self-pay | Admitting: Nurse Practitioner

## 2022-05-30 ENCOUNTER — Telehealth: Payer: Self-pay | Admitting: *Deleted

## 2022-05-30 NOTE — Patient Instructions (Signed)

## 2022-05-30 NOTE — Telephone Encounter (Signed)
Patient left a message on voicemail that he had a telephone call from our office and he does not know why. He plans to come to his next office visit as planned.  He would like whoever called to give him a call back.

## 2022-05-31 DIAGNOSIS — E039 Hypothyroidism, unspecified: Secondary | ICD-10-CM | POA: Diagnosis not present

## 2022-05-31 NOTE — Telephone Encounter (Signed)
Patient was called and made aware that this was appointment reminder.

## 2022-05-31 NOTE — Telephone Encounter (Signed)
It was probably just an appointment reminder call, but no it wasn't me

## 2022-06-01 ENCOUNTER — Encounter: Payer: Self-pay | Admitting: Nurse Practitioner

## 2022-06-01 ENCOUNTER — Ambulatory Visit (INDEPENDENT_AMBULATORY_CARE_PROVIDER_SITE_OTHER): Payer: Medicare Other | Admitting: Nurse Practitioner

## 2022-06-01 ENCOUNTER — Telehealth: Payer: Self-pay | Admitting: Pharmacy Technician

## 2022-06-01 VITALS — BP 124/75 | HR 97 | Ht 64.25 in | Wt 121.0 lb

## 2022-06-01 DIAGNOSIS — E782 Mixed hyperlipidemia: Secondary | ICD-10-CM | POA: Diagnosis not present

## 2022-06-01 DIAGNOSIS — E1065 Type 1 diabetes mellitus with hyperglycemia: Secondary | ICD-10-CM

## 2022-06-01 DIAGNOSIS — E039 Hypothyroidism, unspecified: Secondary | ICD-10-CM | POA: Diagnosis not present

## 2022-06-01 LAB — TSH: TSH: 0.115 u[IU]/mL — ABNORMAL LOW (ref 0.450–4.500)

## 2022-06-01 LAB — T4, FREE: Free T4: 1.82 ng/dL — ABNORMAL HIGH (ref 0.82–1.77)

## 2022-06-01 LAB — POCT GLYCOSYLATED HEMOGLOBIN (HGB A1C): Hemoglobin A1C: 9.5 % — AB (ref 4.0–5.6)

## 2022-06-01 MED ORDER — DEXCOM G6 TRANSMITTER MISC: 3 refills | Status: DC

## 2022-06-01 MED ORDER — DEXCOM G6 SENSOR MISC: 3 refills | Status: DC

## 2022-06-01 MED ORDER — LEVOTHYROXINE SODIUM 137 MCG PO TABS: 137.0000 ug | ORAL_TABLET | Freq: Every day | ORAL | Status: DC

## 2022-06-01 NOTE — Progress Notes (Signed)
Endocrinology follow-up note       06/01/2022, 9:45 AM   Subjective:    Patient ID: Kevin Cooper., male    DOB: May 01, 1981.  Kevin Cooper. is being seen in follow-up for management of currently uncontrolled symptomatic type 1 diabetes, hypothyroidism. PMD:   Default, Provider, MD.   Past Medical History:  Diagnosis Date   Chronic right hip pain    Depression    Diabetes mellitus    diagnosed age 42   Seizures ([place])    last one january 2016 due to low bs   Thyroid disease    Past Surgical History:  Procedure Laterality Date   EYE SURGERY Right age 26   Social History   Socioeconomic History   Marital status: Legally Separated    Spouse name: Not on file   Number of children: Not on file   Years of education: Not on file   Highest education level: Not on file  Occupational History   Not on file  Tobacco Use   Smoking status: Former    Packs/day: 2.00    Years: 4.00    Total pack years: 8.00    Types: Cigarettes    Quit date: 05/29/2003    Years since quitting: 19.0   Smokeless tobacco: Current    Types: Chew  Vaping Use   Vaping Use: Never used  Substance and Sexual Activity   Alcohol use: No    Comment: occassional rare beer or liqour   Drug use: No   Sexual activity: Yes    Birth control/protection: Condom  Other Topics Concern   Not on file  Social History Narrative   Not on file   Social Determinants of Health   Financial Resource Strain: Not on file  Food Insecurity: Not on file  Transportation Needs: Not on file  Physical Activity: Not on file  Stress: Not on file  Social Connections: Not on file   Outpatient Encounter Medications as of 06/01/2022  Medication Sig   atorvastatin (LIPITOR) 20 MG tablet Take 1 tablet (20 mg total) by mouth daily.   Continuous Blood Gluc Receiver (DEXCOM G6 RECEIVER) DEVI Use to monitor glucose   desvenlafaxine (PRISTIQ) 100 MG 24 hr tablet Take  100 mg by mouth at bedtime.   insulin lispro (HUMALOG) 100 UNIT/ML injection Use with pump for TDD around 50 units daily.   lipase/protease/amylase 25000-79000 units CPEP Take 2 capsules before breakfast, lunch, and supper.  Take 1 tab prior to snacks.   pregabalin (LYRICA) 100 MG capsule Take 100 mg by mouth 3 (three) times daily.   Vitamin D, Ergocalciferol, (DRISDOL) 1.25 MG (50000 UNIT) CAPS capsule TAKE 1 CAPSULE BY MOUTH ONCE A WEEK   [DISCONTINUED] Continuous Blood Gluc Sensor (DEXCOM G6 SENSOR) MISC APPLY 1 SENSOR AS DIRECTED, AND CHANGE EVERY 10 DAYS.   [DISCONTINUED] Continuous Blood Gluc Transmit (DEXCOM G6 TRANSMITTER) MISC Change transmitter every 90 days as directed.   [DISCONTINUED] levothyroxine (SYNTHROID) 150 MCG tablet Take 1 tablet (150 mcg total) by mouth daily before breakfast.   Continuous Blood Gluc Sensor (DEXCOM G6 SENSOR) MISC APPLY 1 SENSOR AS DIRECTED, AND CHANGE EVERY 10 DAYS.   Continuous Blood Gluc Transmit (  DEXCOM G6 TRANSMITTER) MISC Change transmitter every 90 days as directed.   FLUoxetine (PROZAC) 20 MG capsule Take 1 capsule (20 mg total) by mouth at bedtime. (Patient not taking: Reported on 06/01/2022)   GLOBAL EASE INJECT PEN NEEDLES 31G X 5 MM MISC Inject into the skin. (Patient not taking: Reported on 02/07/2022)   levothyroxine (SYNTHROID) 137 MCG tablet Take 1 tablet (137 mcg total) by mouth daily before breakfast.   No facility-administered encounter medications on file as of 06/01/2022.    ALLERGIES: Allergies  Allergen Reactions   Tomato Hives, Itching and Rash   Penicillins Other (See Comments) and Rash    UNKNOWN REACTION    VACCINATION STATUS:  There is no immunization history on file for this patient.  Diabetes He presents for his follow-up diabetic visit. He has type 1 diabetes mellitus. Onset time: He was diagnosed at approximate age of 8 years. His disease course has been stable. There are no hypoglycemic associated symptoms. Pertinent  negatives for hypoglycemia include no confusion, dizziness, nervousness/anxiousness or pallor. Associated symptoms include fatigue. Pertinent negatives for diabetes include no chest pain, no polydipsia, no polyphagia, no polyuria, no weakness and no weight loss. There are no hypoglycemic complications. Symptoms are stable. Diabetic complications include impotence and peripheral neuropathy. Risk factors for coronary artery disease include diabetes mellitus, tobacco exposure, sedentary lifestyle, family history, male sex and dyslipidemia. Current diabetic treatment includes insulin pump. He is compliant with treatment some of the time. His weight is fluctuating minimally. He is following a generally unhealthy diet. Meal planning includes carbohydrate counting and ADA exchanges. He has not had a previous visit with a dietitian. He rarely participates in exercise. His home blood glucose trend is fluctuating dramatically. (He presents today, accompanied by his wife, with his Omnipod showing gross hyperglycemia overall.  POCT A1c today is 9.5%, increasing slightly from last visit of 9.2%.  He notes he has had trouble getting his Dexcom G6 supplies and has not been checking his glucose since beginning of December.  He did not call the office for assistance.  Therefore, he has only been relying on his Omnipod to deliver basal rate, not bolusing properly before meals.) An ACE inhibitor/angiotensin II receptor blocker is not being taken. He does not see a podiatrist.Eye exam is current.  Thyroid Problem Presents for follow-up visit. The condition has lasted for  years. Symptoms include diarrhea and fatigue. Patient reports no anxiety, cold intolerance, constipation, heat intolerance, palpitations, weight gain or weight loss. (Joint aches) The symptoms have been stable. Past treatments include levothyroxine. The treatment provided mild relief. His past medical history is significant for diabetes and hyperlipidemia. Risk  factors include family history of hypothyroidism.  Erectile Dysfunction This is a chronic problem. The current episode started more than 1 year ago. The problem has been gradually worsening since onset. The nature of his difficulty is achieving erection and maintaining erection. He reports no anxiety or decreased libido. Irritative symptoms do not include urgency. Pertinent negatives include no chills, dysuria or hematuria. Past treatments include nothing. Risk factors include diabetes mellitus, penile trauma and tobacco use (was stepped on by a horse as a teenager in the groin causing trauma).  Hyperlipidemia This is a chronic problem. The current episode started more than 1 year ago. The problem is uncontrolled. Recent lipid tests were reviewed and are high. Exacerbating diseases include diabetes and hypothyroidism. There are no known factors aggravating his hyperlipidemia. Pertinent negatives include no chest pain or myalgias. Current antihyperlipidemic treatment includes statins.  There are no compliance problems.  Risk factors for coronary artery disease include diabetes mellitus, dyslipidemia, male sex and a sedentary lifestyle.     Review of systems  Constitutional: + stable body weight,  current Body mass index is 20.61 kg/m. , no fatigue, no subjective hyperthermia, no subjective hypothermia Eyes: no blurry vision, no xerophthalmia ENT: no sore throat, no nodules palpated in throat, no dysphagia/odynophagia, no hoarseness Cardiovascular: no chest pain, no shortness of breath, no palpitations, no leg swelling Respiratory: no cough, no shortness of breath Gastrointestinal: no nausea/vomiting, +diarrhea-improved when taking creon properly Musculoskeletal: + generalized muscle/joint aches-worse lately, walks with cane Skin: no rashes, no hyperemia Neurological: no tremors, no numbness, no tingling, no dizziness Psychiatric: no depression, no anxiety, reports increased stress lately- sees new  BH specialist soon  Objective:    BP 124/75 (BP Location: Right Arm, Patient Position: Sitting, Cuff Size: Normal)   Pulse 97   Ht 5' 4.25" (1.632 m)   Wt 121 lb (54.9 kg)   BMI 20.61 kg/m   Wt Readings from Last 3 Encounters:  06/01/22 121 lb (54.9 kg)  02/07/22 120 lb 3.2 oz (54.5 kg)  11/06/21 126 lb (57.2 kg)    BP Readings from Last 3 Encounters:  06/01/22 124/75  02/07/22 100/72  11/06/21 118/75     Physical Exam- Limited  Constitutional:  Body mass index is 20.61 kg/m. , not in acute distress, normal state of mind Eyes:  EOMI, no exophthalmos Musculoskeletal: no gross deformities, strength intact in all four extremities, no gross restriction of joint movements, walks with cane Skin:  no rashes, no hyperemia, extensive tattoos Neurological: no tremor with outstretched hands     CMP ( most recent) CMP     Component Value Date/Time   NA 136 02/02/2021 0935   K 4.4 02/02/2021 0935   CL 96 02/02/2021 0935   CO2 23 02/02/2021 0935   GLUCOSE 428 (H) 02/02/2021 0935   GLUCOSE 177 (H) 05/25/2020 0849   BUN 8 02/02/2021 0935   CREATININE 0.99 02/02/2021 0935   CREATININE 0.87 07/15/2018 1111   CALCIUM 9.7 02/02/2021 0935   PROT 7.3 02/02/2021 0935   ALBUMIN 5.2 (H) 02/02/2021 0935   AST 27 02/02/2021 0935   ALT 19 02/02/2021 0935   ALKPHOS 187 (H) 02/02/2021 0935   BILITOT 0.4 02/02/2021 0935   GFRNONAA >60 05/25/2020 0849   GFRNONAA 109 07/15/2018 1111   GFRAA >60 12/23/2019 1050   GFRAA 127 07/15/2018 1111    Diabetic Labs (most recent): Lab Results  Component Value Date   HGBA1C 9.5 (A) 06/01/2022   HGBA1C 9.2 (A) 02/07/2022   HGBA1C 10.7 08/28/2021   MICROALBUR 30 10/02/2021   MICROALBUR 7.3 (H) 12/23/2019   MICROALBUR 0.3 07/15/2018     Lab Results  Component Value Date   TSH 0.115 (L) 05/31/2022   TSH 220.000 (H) 02/27/2022   TSH 298.000 (H) 02/02/2021   TSH 263.000 (H) 07/19/2020   TSH 181.194 (H) 12/23/2019   TSH 14.02 (H) 07/15/2018    TSH 134.12 (H) 08/11/2015   TSH 32.191 (H) 10/30/2011   FREET4 1.82 (H) 05/31/2022   FREET4 0.20 (L) 02/27/2022   FREET4 0.21 (L) 02/02/2021   FREET4 0.23 (L) 07/19/2020   FREET4 0.37 (L) 12/23/2019   FREET4 1.3 07/15/2018   FREET4 0.45 (L) 11/01/2011    Lipid Panel     Component Value Date/Time   CHOL 183 05/25/2020 0849   TRIG 60 05/25/2020 0849   HDL 63  05/25/2020 0849   CHOLHDL 2.9 05/25/2020 0849   VLDL 12 05/25/2020 0849   LDLCALC 108 (H) 05/25/2020 0849   LDLCALC 63 07/15/2018 1111    Assessment & Plan:   1) Uncontrolled type 1 diabetes mellitus with hyperglycemia (HCC) - Kevin Cooper. has currently uncontrolled symptomatic type 1 DM since 42 years of age.  He presents today, accompanied by his wife, with his Omnipod showing gross hyperglycemia overall.  POCT A1c today is 9.5%, increasing slightly from last visit of 9.2%.  He notes he has had trouble getting his Dexcom G6 supplies and has not been checking his glucose since beginning of December.  He did not call the office for assistance.  Therefore, he has only been relying on his Omnipod to deliver basal rate, not bolusing properly before meals.  -his diabetes is complicated by peripheral neuropathy, tobacco use/abuse and he remains at a high risk for more acute and chronic complications which include CAD, CVA, CKD, retinopathy, and neuropathy. These are all discussed in detail with him.  - Nutritional counseling repeated at each appointment due to patients tendency to fall back in to old habits.  - The patient admits there is a room for improvement in their diet and drink choices. -  Suggestion is made for the patient to avoid simple carbohydrates from their diet including Cakes, Sweet Desserts / Pastries, Ice Cream, Soda (diet and regular), Sweet Tea, Candies, Chips, Cookies, Sweet Pastries, Store Bought Juices, Alcohol in Excess of 1-2 drinks a day, Artificial Sweeteners, Coffee Creamer, and "Sugar-free" Products.  This will help patient to have stable blood glucose profile and potentially avoid unintended weight gain.   - I encouraged the patient to switch to unprocessed or minimally processed complex starch and increased protein intake (animal or plant source), fruits, and vegetables.   - Patient is advised to stick to a routine mealtimes to eat 3 meals a day and avoid unnecessary snacks (to snack only to correct hypoglycemia).  - he has been scheduled with Jearld Fenton, RDN, CDE for individualized diabetes education previously.  - I have approached him with the following individualized plan to manage diabetes and patient agrees:   -No changes will be made to his Omnipod settings today.  He is encouraged to start bolusing before meals and to stay in automatic mode as much as possible.  I did resend his Rx for Dexcom G6 to the pharmacy.  Says he was told PA needed to be done but we never received any communication.  -He is advised to continue monitoring blood glucose at least 4 times a day (using his CGM or backup method), before meals and at bedtime.  He is also instructed to call the clinic if his blood glucose is less than 70 or greater than 200 for 3 tests in a row.  - he is warned not to take insulin without proper monitoring per orders.  -He is not a candidate for non-insulin medications for diabetes management.  - Patient specific target  A1c;  LDL, HDL, Triglycerides, and  Waist Circumference were discussed in detail.  2) Blood Pressure /Hypertension: His blood pressure is controlled to target.  He is not on any antihypertensives at this time. He will be considered for low dose ACE or ARB if BP is elevated on 3 separate visits.  3) Lipids/Hyperlipidemia:  His most recent lipid panel from 04/23/22 shows controlled LDL at 21.  He is advised to continue Lipitor 20 mg po daily at bedtime.  Side effects  and precautions discussed with him.    4)  Weight/Diet:  His Body mass index is 20.61 kg/m.   He is not a candidate for weight loss.  CDE Consult is in process. Exercise, and detailed carbohydrates information provided  -  detailed on discharge instructions.  5) Hypothyroidism His previsit thyroid function tests are consistent with slight over-replacement (possibly due to increased absorption now that he is taking his Creon properly).  Will lower his Levothyroxine to 137 mcg po daily before breakfast (wife has some at home, doesn't need new script at this time).   Will repeat thyroid function tests today and adjust further if needed.  - We discussed about the correct intake of his thyroid hormone, on empty stomach at fasting, with water, separated by at least 30 minutes from breakfast and other medications,  and separated by more than 4 hours from calcium, iron, multivitamins, acid reflux medications (PPIs). -Patient is made aware of the fact that thyroid hormone replacement is needed for life, dose to be adjusted by periodic monitoring of thyroid function tests.  6) Vitamin D deficiency: His recent vitamin D level from 02/02/21 was low at 11.1.  He has finished Ergocalciferol 50000 units weekly x 12 weeks.  7) Chronic Care/Health Maintenance: -he is not on ACEI/ARB and is on Statin medications and is encouraged to initiate and continue to follow up with Ophthalmology, Dentist,  Podiatrist at least yearly or according to recommendations, and advised to quit tobacco use/abuse. I have recommended yearly flu vaccine and pneumonia vaccine at least every 5 years; moderate intensity exercise for up to 150 minutes weekly; and  sleep for at least 7 hours a day.  - he is advised to maintain close follow up with Default, Provider, MD for primary care needs, as well as his other providers for optimal and coordinated care.   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  He notes the Creon seems to be helping substantially, he is advised to  continue.       I spent 46 minutes in the care of the patient today including review of labs from CMP, Lipids, Thyroid Function, Hematology (current and previous including abstractions from other facilities); face-to-face time discussing  his blood glucose readings/logs, discussing hypoglycemia and hyperglycemia episodes and symptoms, medications doses, his options of short and long term treatment based on the latest standards of care / guidelines;  discussion about incorporating lifestyle medicine;  and documenting the encounter. Risk reduction counseling performed per USPSTF guidelines to reduce obesity and cardiovascular risk factors.     Please refer to Patient Instructions for Blood Glucose Monitoring and Insulin/Medications Dosing Guide"  in media tab for additional information. Please  also refer to " Patient Self Inventory" in the Media  tab for reviewed elements of pertinent patient history.  Kevin Canales. participated in the discussions, expressed understanding, and voiced agreement with the above plans.  All questions were answered to his satisfaction. he is encouraged to contact clinic should he have any questions or concerns prior to his return visit.     Follow up plan: - Return in about 3 months (around 08/31/2022) for Diabetes F/U with A1c in office, Previsit labs, Thyroid follow up, Bring meter and logs.  Ronny Bacon, [facility] [address] [place], [place] [address] Phone: [phone] Fax: [phone]   06/01/2022, 9:45 AM

## 2022-06-01 NOTE — Telephone Encounter (Signed)
Patient advised office his CGM needs a PA. Please conduct BIV on CGM and any necessary supplies.

## 2022-06-04 ENCOUNTER — Encounter (HOSPITAL_COMMUNITY): Payer: Self-pay | Admitting: Psychiatry

## 2022-06-04 ENCOUNTER — Ambulatory Visit (INDEPENDENT_AMBULATORY_CARE_PROVIDER_SITE_OTHER): Payer: Medicare Other | Admitting: Psychiatry

## 2022-06-04 ENCOUNTER — Ambulatory Visit (HOSPITAL_COMMUNITY): Payer: Medicare Other | Admitting: Psychiatry

## 2022-06-04 ENCOUNTER — Other Ambulatory Visit (HOSPITAL_COMMUNITY): Payer: Self-pay

## 2022-06-04 DIAGNOSIS — F410 Panic disorder [episodic paroxysmal anxiety] without agoraphobia: Secondary | ICD-10-CM | POA: Insufficient documentation

## 2022-06-04 DIAGNOSIS — R45851 Suicidal ideations: Secondary | ICD-10-CM | POA: Insufficient documentation

## 2022-06-04 DIAGNOSIS — F411 Generalized anxiety disorder: Secondary | ICD-10-CM | POA: Insufficient documentation

## 2022-06-04 DIAGNOSIS — F332 Major depressive disorder, recurrent severe without psychotic features: Secondary | ICD-10-CM

## 2022-06-04 DIAGNOSIS — Z87898 Personal history of other specified conditions: Secondary | ICD-10-CM | POA: Insufficient documentation

## 2022-06-04 DIAGNOSIS — F17220 Nicotine dependence, chewing tobacco, uncomplicated: Secondary | ICD-10-CM

## 2022-06-04 DIAGNOSIS — F10982 Alcohol use, unspecified with alcohol-induced sleep disorder: Secondary | ICD-10-CM

## 2022-06-04 DIAGNOSIS — G4700 Insomnia, unspecified: Secondary | ICD-10-CM | POA: Insufficient documentation

## 2022-06-04 DIAGNOSIS — F19982 Other psychoactive substance use, unspecified with psychoactive substance-induced sleep disorder: Secondary | ICD-10-CM | POA: Insufficient documentation

## 2022-06-04 DIAGNOSIS — F17200 Nicotine dependence, unspecified, uncomplicated: Secondary | ICD-10-CM | POA: Insufficient documentation

## 2022-06-04 DIAGNOSIS — F1090 Alcohol use, unspecified, uncomplicated: Secondary | ICD-10-CM | POA: Diagnosis not present

## 2022-06-04 DIAGNOSIS — E1040 Type 1 diabetes mellitus with diabetic neuropathy, unspecified: Secondary | ICD-10-CM

## 2022-06-04 DIAGNOSIS — E559 Vitamin D deficiency, unspecified: Secondary | ICD-10-CM

## 2022-06-04 DIAGNOSIS — F1020 Alcohol dependence, uncomplicated: Secondary | ICD-10-CM | POA: Insufficient documentation

## 2022-06-04 DIAGNOSIS — G63 Polyneuropathy in diseases classified elsewhere: Secondary | ICD-10-CM | POA: Insufficient documentation

## 2022-06-04 DIAGNOSIS — F1021 Alcohol dependence, in remission: Secondary | ICD-10-CM | POA: Insufficient documentation

## 2022-06-04 HISTORY — DX: Vitamin D deficiency, unspecified: E55.9

## 2022-06-04 HISTORY — DX: Insomnia, unspecified: G47.00

## 2022-06-04 MED ORDER — MIRTAZAPINE 15 MG PO TABS: 15.0000 mg | ORAL_TABLET | Freq: Every day | ORAL | 2 refills | Status: DC

## 2022-06-04 MED ORDER — DESVENLAFAXINE SUCCINATE ER 100 MG PO TB24: 100.0000 mg | ORAL_TABLET | Freq: Every day | ORAL | 2 refills | Status: DC

## 2022-06-04 NOTE — Telephone Encounter (Signed)
Pharmacy Patient Advocate Encounter   Received notification that prior authorization for CGM (Dexom) is required/requested.  Per Test Claim: Not covered under part D, may be covered under part B
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
## 2022-06-04 NOTE — Patient Instructions (Addendum)
We switched your Pristiq to the morning that should help with your ability to sleep at night.  We also added Remeron 15 mg nightly to your regimen to help you with sleep, nausea, appetite and will begin to help with your depression as well.  As we discussed please try to cut back on the amount of alcohol that you are drinking each day, that should help with your depressed mood and insomnia.  I put in a referral for psychotherapy please call [phone] to get set up for that.  Please fill out more of the safety plan as you see fit but the numbers to remember are [phone] and the [facility] line: [phone].

## 2022-06-05 NOTE — Telephone Encounter (Signed)
It isn't a pharmacy benefit so I did send it to [employer] to see if it is covered there as DME equipment.

## 2022-06-06 NOTE — Telephone Encounter (Signed)
Patient was called and made aware. 

## 2022-06-13 ENCOUNTER — Telehealth: Payer: Self-pay | Admitting: Nurse Practitioner

## 2022-06-13 NOTE — Telephone Encounter (Signed)
I called and shared this information with Sonia Side. He states that they will call the number for Dexcom. I told him that we would keep him updated as we hear anything.

## 2022-06-13 NOTE — Telephone Encounter (Signed)
Kevin Cooper  , have you heard anything form [employer]? You had said that other day you were going to send it to them as the it was not insurance benefit?

## 2022-06-13 NOTE — Telephone Encounter (Signed)
I sent it to [employer] and they canceled the order as it was a pharmacy benefit but previous documentation from the RX PA team says it wasn't covered as pharmacy benefit.  I am looping them in to the conversation to see if they have any idea how to proceed.  Tammy, in the meantime I recommend he reach out to [facility] patient assistance program who helps cover cost of their product if they have type 1 diabetes and are uninsured or UNDERinsured.

## 2022-06-13 NOTE — Telephone Encounter (Signed)
New message    Lavella Lemons calling have not heard  back from Medicare regarding patient  Continuous Blood Gluc Sensor (DEXCOM G6 SENSOR) MISC  Asking for a call back to discuss

## 2022-06-14 ENCOUNTER — Other Ambulatory Visit: Payer: Self-pay | Admitting: Nurse Practitioner

## 2022-06-20 NOTE — Telephone Encounter (Signed)
Talked with the patient and his G 6 sensor has expired as of today. He is out. He was made aware that the PA team has been working very hard and it is taking longer than a week. I would however follow up with them to see about the status of the PA.

## 2022-06-26 ENCOUNTER — Telehealth: Payer: Self-pay | Admitting: Nurse Practitioner

## 2022-06-26 NOTE — Telephone Encounter (Signed)
Received medical record request from [facility]. Faxed to [employer]  to release records

## 2022-06-28 ENCOUNTER — Other Ambulatory Visit (HOSPITAL_BASED_OUTPATIENT_CLINIC_OR_DEPARTMENT_OTHER): Payer: Self-pay

## 2022-06-29 NOTE — Telephone Encounter (Signed)
So we got notification from [employer] that it is not a DME benefit and the pharmacy is saying it is not covered by his Part D, therefore he will need to reach out to [facility] and go that route with the patient assistance program.  He is underinsured and has type 1 diabetes which should meet the criteria to get him assistance with this.

## 2022-07-03 ENCOUNTER — Encounter: Payer: Self-pay | Admitting: Podiatry

## 2022-07-03 ENCOUNTER — Ambulatory Visit (INDEPENDENT_AMBULATORY_CARE_PROVIDER_SITE_OTHER): Payer: 59 | Admitting: Podiatry

## 2022-07-03 VITALS — BP 110/70

## 2022-07-03 DIAGNOSIS — L97522 Non-pressure chronic ulcer of other part of left foot with fat layer exposed: Secondary | ICD-10-CM

## 2022-07-03 DIAGNOSIS — L539 Erythematous condition, unspecified: Secondary | ICD-10-CM | POA: Diagnosis not present

## 2022-07-03 DIAGNOSIS — E1065 Type 1 diabetes mellitus with hyperglycemia: Secondary | ICD-10-CM | POA: Diagnosis not present

## 2022-07-03 DIAGNOSIS — L97512 Non-pressure chronic ulcer of other part of right foot with fat layer exposed: Secondary | ICD-10-CM | POA: Diagnosis not present

## 2022-07-03 MED ORDER — DOXYCYCLINE HYCLATE 100 MG PO TABS: 100.0000 mg | ORAL_TABLET | Freq: Two times a day (BID) | ORAL | 0 refills | Status: DC

## 2022-07-03 NOTE — Progress Notes (Signed)
Subjective:  Patient ID: Kevin Cooper., male    DOB: 06/01/1980,  MRN: [id number]  Chief Complaint  Patient presents with   Nail Problem    Nail fungus. Pt stated that he has neuropathy and has sores on his toes     42 y.o. male presents for wound care.  Patient presents with bilateral hallux ulceration with fat layer exposed.  Patient states that there is some soreness he has neuropathy in his toes and wanted to discuss treatment options for it.  He is a diabetic.  He wanted to get it evaluated he has not seen anyone else prior to seeing me for this well.  He is currently not on any antibiotics.   Review of Systems: Negative except as noted in the HPI. Denies N/V/F/Ch.  Past Medical History:  Diagnosis Date   Chronic right hip pain    Depression    Diabetes mellitus    diagnosed age 26   Seizures ([place])    last one january 2016 due to low bs   Thyroid disease     Current Outpatient Medications:    doxycycline (VIBRA-TABS) 100 MG tablet, Take 1 tablet (100 mg total) by mouth 2 (two) times daily., Disp: 60 tablet, Rfl: 0   doxycycline (VIBRAMYCIN) 100 MG capsule, Take 100 mg by mouth 2 (two) times daily., Disp: , Rfl:    FLUoxetine (PROZAC) 40 MG capsule, Take 40 mg by mouth daily., Disp: , Rfl:    nystatin (MYCOSTATIN/NYSTOP) powder, Apply topically., Disp: , Rfl:    atorvastatin (LIPITOR) 20 MG tablet, Take 1 tablet (20 mg total) by mouth daily., Disp: 90 tablet, Rfl: 0   Continuous Blood Gluc Receiver ([place]) DEVI, Use to monitor glucose, Disp: 1 each, Rfl: 0   Continuous Blood Gluc Sensor (DEXCOM G6 SENSOR) MISC, APPLY 1 SENSOR AS DIRECTED, AND CHANGE EVERY 10 DAYS., Disp: 9 each, Rfl: 3   Continuous Blood Gluc Transmit (DEXCOM G6 TRANSMITTER) MISC, Change transmitter every 90 days as directed., Disp: 1 each, Rfl: 3   desvenlafaxine (PRISTIQ) 100 MG 24 hr tablet, Take 1 tablet (100 mg total) by mouth daily., Disp: 30 tablet, Rfl: 2   GLOBAL EASE INJECT PEN NEEDLES  31G X 5 MM MISC, Inject into the skin. (Patient not taking: Reported on 02/07/2022), Disp: , Rfl:    Insulin Disposable Pump (OMNIPOD 5 G6 PODS, GEN 5,) MISC, APPLY 1 POD AS DIRECTED AND, REPLACE POD EVERY 48 TO 72 HOURS., Disp: 15 each, Rfl: 6   insulin lispro (HUMALOG) 100 UNIT/ML injection, Use with pump for TDD around 50 units daily., Disp: 40 mL, Rfl: 3   insulin lispro (HUMALOG) 100 UNIT/ML injection, USE WITH PUMP FOR TDD AROUND 50 UNITS DAILY, Disp: 40 mL, Rfl: 2   levothyroxine (SYNTHROID) 137 MCG tablet, Take 1 tablet (137 mcg total) by mouth daily before breakfast., Disp: 90 tablet, Rfl:    lipase/protease/amylase 25000-79000 units CPEP, Take 2 capsules before breakfast, lunch, and supper.  Take 1 tab prior to snacks., Disp: 720 capsule, Rfl: 3   mirtazapine (REMERON) 15 MG tablet, Take 1 tablet (15 mg total) by mouth at bedtime., Disp: 30 tablet, Rfl: 2   pregabalin (LYRICA) 100 MG capsule, Take 100 mg by mouth 3 (three) times daily., Disp: , Rfl:    Vitamin D, Ergocalciferol, (DRISDOL) 1.25 MG (50000 UNIT) CAPS capsule, TAKE 1 CAPSULE BY MOUTH ONCE A WEEK, Disp: 12 capsule, Rfl: 0  Social History   Tobacco Use  Smoking Status Former  Packs/day: 2.00   Years: 4.00   Total pack years: 8.00   Types: Cigarettes   Quit date: 05/29/2003   Years since quitting: 19.1  Smokeless Tobacco Current   Types: Chew  Tobacco Comments   2 to 3 cans of dip per week.  Previously tried patches, gum, lozenges with lack of effect    Allergies  Allergen Reactions   Tomato Hives, Itching and Rash   Penicillins Other (See Comments) and Rash    UNKNOWN REACTION   Objective:   Vitals:   07/03/22 0928  BP: 110/70   There is no height or weight on file to calculate BMI. Constitutional Well developed. Well nourished.  Vascular Dorsalis pedis pulses palpable bilaterally. Posterior tibial pulses palpable bilaterally. Capillary refill normal to all digits.  No cyanosis or clubbing noted. Pedal  hair growth normal.  Neurologic Normal speech. Oriented to person, place, and time. Protective sensation absent  Dermatologic Wound Location: Bilateral hallux fat layer exposed does not probe down to bone.  No purulent drainage noted mild erythema noted. Wound Base: Mixed Granular/Fibrotic Peri-wound: Reddened Exudate: Scant/small amount Serosanguinous exudate Wound Measurements: -See above  Orthopedic: No pain to palpation either foot.   Radiographs: None Assessment:  No diagnosis found. Plan:  Patient was evaluated and treated and all questions answered.  Ulcer bilateral hallux with fat layer exposed -Debridement as below. -Dressed with triple antibiotic and a Band-Aid for now. -Continue off-loading with surgical shoe. -Doxycycline was dispensed for skin and soft tissue prophylaxis for 30 days until resolve meant of the wound.   Procedure: Excisional Debridement of Wound right hallux Tool: Sharp chisel blade/tissue nipper Rationale: Removal of non-viable soft tissue from the wound to promote healing.  Anesthesia: none Pre-Debridement Wound Measurements: 0.7 cm x 0.5 cm x 0.3 cm  Post-Debridement Wound Measurements: 0.6 cm x 0.3 cm x 0.3 cm  Type of Debridement: Sharp Excisional Tissue Removed: Non-viable soft tissue Blood loss: Minimal (<50cc) Depth of Debridement: subcutaneous tissue. Technique: Sharp excisional debridement to bleeding, viable wound base.  Wound Progress: This is my initial evaluation of continue monitor the progression of the wound Site healing conversation 7 Dressing: Dry, sterile, compression dressing. Disposition: Patient tolerated procedure well. Patient to return in 1 week for follow-up.   Procedure: Excisional Debridement of Wound left hallux Tool: Sharp chisel blade/tissue nipper Rationale: Removal of non-viable soft tissue from the wound to promote healing.  Anesthesia: none Pre-Debridement Wound Measurements: 0.5 cm x 0.4 cm x 0.3 cm   Post-Debridement Wound Measurements: 0.5 cm x 0.3 cm x 0.3 cm  Type of Debridement: Sharp Excisional Tissue Removed: Non-viable soft tissue Blood loss: Minimal (<50cc) Depth of Debridement: subcutaneous tissue. Technique: Sharp excisional debridement to bleeding, viable wound base.  Wound Progress: This is my initial evaluation of continue to the monitor the progression of the wound Site healing conversation 7 Dressing: Dry, sterile, compression dressing. Disposition: Patient tolerated procedure well. Patient to return in 1 week for follow-up. No follow-ups on file.

## 2022-07-03 NOTE — Telephone Encounter (Signed)
I called the patient's wife, Jenny Reichmann and shared with her this message. I also shared that we had 2 G 6 sensors for Kevin Cooper to pick up. Also, stressed that they need to call Dexcom as Whitney had strongly suggested.

## 2022-07-06 ENCOUNTER — Telehealth (HOSPITAL_COMMUNITY): Payer: Medicare Other | Admitting: Psychiatry

## 2022-07-10 ENCOUNTER — Other Ambulatory Visit: Payer: Self-pay | Admitting: Nurse Practitioner

## 2022-07-11 ENCOUNTER — Other Ambulatory Visit: Payer: Self-pay | Admitting: "Endocrinology

## 2022-07-12 ENCOUNTER — Telehealth: Payer: Self-pay | Admitting: *Deleted

## 2022-07-12 ENCOUNTER — Other Ambulatory Visit (HOSPITAL_COMMUNITY): Payer: Self-pay

## 2022-07-12 MED ORDER — DEXCOM G6 RECEIVER DEVI: 0 refills | Status: DC

## 2022-07-12 MED ORDER — DEXCOM G6 TRANSMITTER MISC: 3 refills | Status: DC

## 2022-07-12 MED ORDER — DEXCOM G6 SENSOR MISC: 3 refills | Status: DC

## 2022-07-12 NOTE — Telephone Encounter (Signed)
Patient left a message that his pharmacy told him that he would be covered for his sensors under his part D on his insurance. He is asking if Loree Fee would send this into them and also that it would have to come form a [employer]. I am also forwarding to the RX PA prior auth team to see if they know anything about this.

## 2022-07-12 NOTE — Telephone Encounter (Signed)
I sent his Dexcom supplies prescriptions to [place] who can assist in helping [place] find who covers his DME supplies.  We have been told by pharmacy that it is not a pharmacy benefit, been told by DME that it is not a DME benefit.  So I am at a loss.  He NEEDS to try filling out the [facility] patient assistance paperwork, which can be found at [email or web address] in the meantime too.

## 2022-07-13 ENCOUNTER — Telehealth: Payer: Self-pay | Admitting: Pharmacy Technician

## 2022-07-13 ENCOUNTER — Other Ambulatory Visit (HOSPITAL_COMMUNITY): Payer: Self-pay

## 2022-07-13 NOTE — Telephone Encounter (Signed)
Pharmacy Patient Advocate Encounter   Received notification from Pt calls msgs/provider that authorization for Dexcom is required/requested.  Per Test Claim: Part B Transition, Transition, Dr. Delilah Shan [email or web address] [email or web address] Transition max 30 day unless unbreakable Consider 100 DS for same copay as 90 DS Part B claim; If BSQ=51, bill Medicaid for copay. Balance Billing prohibited Remaining CMS Transition days supply is 0.   PA submitted on 07/13/22 to (ins) [employer] via CoverMyMeds Key [id number] Status is pending

## 2022-07-13 NOTE — Telephone Encounter (Signed)
Patient was called and a detailed message was left with the PA teams comment and also Whitney's recommendation.

## 2022-07-19 ENCOUNTER — Encounter (HOSPITAL_COMMUNITY): Payer: Self-pay | Admitting: Psychiatry

## 2022-07-19 ENCOUNTER — Telehealth (INDEPENDENT_AMBULATORY_CARE_PROVIDER_SITE_OTHER): Payer: 59 | Admitting: Psychiatry

## 2022-07-19 DIAGNOSIS — F17200 Nicotine dependence, unspecified, uncomplicated: Secondary | ICD-10-CM

## 2022-07-19 DIAGNOSIS — F411 Generalized anxiety disorder: Secondary | ICD-10-CM | POA: Diagnosis not present

## 2022-07-19 DIAGNOSIS — F332 Major depressive disorder, recurrent severe without psychotic features: Secondary | ICD-10-CM | POA: Diagnosis not present

## 2022-07-19 DIAGNOSIS — F17290 Nicotine dependence, other tobacco product, uncomplicated: Secondary | ICD-10-CM | POA: Diagnosis not present

## 2022-07-19 DIAGNOSIS — E559 Vitamin D deficiency, unspecified: Secondary | ICD-10-CM

## 2022-07-19 DIAGNOSIS — R45851 Suicidal ideations: Secondary | ICD-10-CM

## 2022-07-19 NOTE — Patient Instructions (Addendum)
We discontinued the Remeron today since it was causing you to be excessively groggy throughout the day.  Keep up the good work with cutting back on alcohol this should help with both her depression, anxiety and insomnia.  I will try to get in touch with your PCP to see if they will send over the most recent results of your EKG, lipid panel, A1c but they may require records release so if you can swing by the clinic to provide that would probably speed things along.  When you have you next CMP, specifically we are looking for the eGFR, AST, ALT results.  While you are waiting to start psychotherapy you can start to look through this PDF and it should help with some of the reactivity that you have been noticing: [email or web address]
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
## 2022-07-24 ENCOUNTER — Other Ambulatory Visit (HOSPITAL_COMMUNITY): Payer: Self-pay

## 2022-07-24 NOTE — Telephone Encounter (Signed)
Patient was called and made aware. 

## 2022-07-24 NOTE — Telephone Encounter (Signed)
Wonderful, thanks for your help!  Kevin Cooper, [name], it was approved!

## 2022-07-24 NOTE — Telephone Encounter (Addendum)
Pharmacy Patient Advocate Encounter  Prior Authorization for [place] sensors has been approved.   Continuous glucose monitor system (reader, transmitter, and sensor), use as directed, is approved under your Part B benefit through 05/28/2023. Reviewed by: Celene SkeenPh. **Please note: This review applies to [employer], Dexcom G7, Freestyle Venturia, Freestyle Libre 2, Freestyle Libre 3, and [place] 14.  PA#  [id number]  Looks like rx was already filled yesterday. I'm not able to tell where.   Approval letter indexed to pt's chart.

## 2022-07-25 ENCOUNTER — Encounter (HOSPITAL_COMMUNITY): Payer: Self-pay

## 2022-07-25 ENCOUNTER — Other Ambulatory Visit: Payer: Self-pay

## 2022-07-25 ENCOUNTER — Other Ambulatory Visit (HOSPITAL_COMMUNITY): Payer: Self-pay | Admitting: Psychiatry

## 2022-07-25 DIAGNOSIS — F411 Generalized anxiety disorder: Secondary | ICD-10-CM

## 2022-07-25 DIAGNOSIS — R45851 Suicidal ideations: Secondary | ICD-10-CM

## 2022-07-25 DIAGNOSIS — F332 Major depressive disorder, recurrent severe without psychotic features: Secondary | ICD-10-CM

## 2022-07-25 MED ORDER — DEXCOM G6 SENSOR MISC: 3 refills | Status: DC

## 2022-07-25 MED ORDER — ARIPIPRAZOLE 2 MG PO TABS: 2.0000 mg | ORAL_TABLET | Freq: Every day | ORAL | 1 refills | Status: DC

## 2022-07-25 NOTE — Progress Notes (Signed)
Patient able to get blood work from PCP. A1c of 9.2 on 05/07/22, Alk phos 182, AST 30, ALT 32 with EGFR 108 on 04/23/22, lipid panel WNL on 04/23/22, TSH low 0.219 on 04/23/22, QTc  of 357m on 05/14/22 with normal sinus rhythm. Patient safe to start abilify '2mg'$  which was [facility] to [facility]

## 2022-07-31 ENCOUNTER — Encounter (HOSPITAL_COMMUNITY): Payer: Self-pay

## 2022-07-31 ENCOUNTER — Ambulatory Visit (INDEPENDENT_AMBULATORY_CARE_PROVIDER_SITE_OTHER): Payer: 59 | Admitting: Clinical

## 2022-07-31 DIAGNOSIS — F419 Anxiety disorder, unspecified: Secondary | ICD-10-CM | POA: Diagnosis not present

## 2022-07-31 DIAGNOSIS — F331 Major depressive disorder, recurrent, moderate: Secondary | ICD-10-CM | POA: Diagnosis not present

## 2022-07-31 DIAGNOSIS — F1020 Alcohol dependence, uncomplicated: Secondary | ICD-10-CM | POA: Diagnosis not present
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
## 2022-08-14 ENCOUNTER — Other Ambulatory Visit: Payer: Self-pay

## 2022-08-14 ENCOUNTER — Ambulatory Visit: Payer: 59 | Admitting: Podiatry

## 2022-08-14 MED ORDER — OMNIPOD 5 DEXG7G6 PODS GEN 5 MISC: 3 refills | Status: DC

## 2022-08-14 MED ORDER — INSULIN LISPRO 100 UNIT/ML IJ SOLN: INTRAMUSCULAR | 1 refills | Status: DC

## 2022-08-14 MED ORDER — PANCRELIPASE (LIP-PROT-AMYL) 25000-79000 UNITS PO CPEP: ORAL_CAPSULE | ORAL | 1 refills | Status: DC

## 2022-08-15 ENCOUNTER — Other Ambulatory Visit: Payer: Self-pay | Admitting: "Endocrinology

## 2022-08-17 ENCOUNTER — Telehealth (HOSPITAL_COMMUNITY): Payer: No Typology Code available for payment source | Admitting: Psychiatry

## 2022-08-20 ENCOUNTER — Encounter (HOSPITAL_COMMUNITY): Payer: Self-pay | Admitting: Psychiatry

## 2022-08-20 ENCOUNTER — Telehealth (INDEPENDENT_AMBULATORY_CARE_PROVIDER_SITE_OTHER): Payer: No Typology Code available for payment source | Admitting: Psychiatry

## 2022-08-20 DIAGNOSIS — R45851 Suicidal ideations: Secondary | ICD-10-CM | POA: Diagnosis not present

## 2022-08-20 DIAGNOSIS — F332 Major depressive disorder, recurrent severe without psychotic features: Secondary | ICD-10-CM | POA: Diagnosis not present

## 2022-08-20 DIAGNOSIS — F1021 Alcohol dependence, in remission: Secondary | ICD-10-CM

## 2022-08-20 DIAGNOSIS — F17200 Nicotine dependence, unspecified, uncomplicated: Secondary | ICD-10-CM

## 2022-08-20 DIAGNOSIS — F411 Generalized anxiety disorder: Secondary | ICD-10-CM | POA: Diagnosis not present

## 2022-08-20 MED ORDER — DESVENLAFAXINE SUCCINATE ER 100 MG PO TB24: 100.0000 mg | ORAL_TABLET | Freq: Every day | ORAL | 2 refills | Status: DC

## 2022-08-20 NOTE — Patient Instructions (Signed)
No medication changes today to give abilify more of a chance to work at its current dose. Keep up the good work with cutting back on caffeine and alcohol!
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
## 2022-08-28 LAB — TSH: TSH: 0.047 u[IU]/mL — ABNORMAL LOW (ref 0.450–4.500)

## 2022-08-28 LAB — T4, FREE: Free T4: 1.85 ng/dL — ABNORMAL HIGH (ref 0.82–1.77)

## 2022-08-31 ENCOUNTER — Encounter: Payer: Self-pay | Admitting: Nurse Practitioner

## 2022-08-31 ENCOUNTER — Ambulatory Visit (INDEPENDENT_AMBULATORY_CARE_PROVIDER_SITE_OTHER): Payer: 59 | Admitting: Nurse Practitioner

## 2022-08-31 VITALS — BP 110/72 | HR 85 | Ht 64.25 in | Wt 125.4 lb

## 2022-08-31 DIAGNOSIS — E559 Vitamin D deficiency, unspecified: Secondary | ICD-10-CM

## 2022-08-31 DIAGNOSIS — E1065 Type 1 diabetes mellitus with hyperglycemia: Secondary | ICD-10-CM | POA: Diagnosis not present

## 2022-08-31 DIAGNOSIS — E039 Hypothyroidism, unspecified: Secondary | ICD-10-CM

## 2022-08-31 DIAGNOSIS — E782 Mixed hyperlipidemia: Secondary | ICD-10-CM

## 2022-08-31 LAB — POCT GLYCOSYLATED HEMOGLOBIN (HGB A1C): Hemoglobin A1C: 10.4 % — AB (ref 4.0–5.6)

## 2022-08-31 MED ORDER — PANCRELIPASE (LIP-PROT-AMYL) 25000-79000 UNITS PO CPEP: ORAL_CAPSULE | ORAL | 1 refills | Status: DC

## 2022-08-31 MED ORDER — LEVOTHYROXINE SODIUM 100 MCG PO TABS
100.0000 ug | ORAL_TABLET | Freq: Every day | ORAL | 1 refills | Status: DC
Start: 2022-08-31 — End: 2022-09-05

## 2022-08-31 NOTE — Progress Notes (Signed)
Endocrinology follow-up note       08/31/2022, 9:38 AM   Subjective:    Patient ID: Kevin Choe., male    DOB: 02-20-1981.  Kevin Cooper. is being seen in follow-up for management of currently uncontrolled symptomatic type 1 diabetes, hypothyroidism. PMD:   Golden Pop, FNP.   Past Medical History:  Diagnosis Date   Chronic right hip pain    Depression    Diabetes mellitus    diagnosed age 42   Insomnia 06/04/2022   Seizures    last one january 2016 due to low bs   Thyroid disease    Past Surgical History:  Procedure Laterality Date   EYE SURGERY Right age 79   Social History   Socioeconomic History   Marital status: Legally Separated    Spouse name: Not on file   Number of children: Not on file   Years of education: Not on file   Highest education level: Not on file  Occupational History   Not on file  Tobacco Use   Smoking status: Former    Packs/day: 2.00    Years: 4.00    Additional pack years: 0.00    Total pack years: 8.00    Types: Cigarettes    Quit date: 05/29/2003    Years since quitting: 19.2   Smokeless tobacco: Current    Types: Chew   Tobacco comments:    2 to 3 cans of dip per week.  Previously tried patches, gum, lozenges with lack of effect  Vaping Use   Vaping Use: Never used  Substance and Sexual Activity   Alcohol use: Yes    Comment: Abstinent from alcohol as of 08/13/2022.  Previously drinking at least 1 shot of liquor nightly, sometimes 2-3.  Other times will have heavier episodes of drinking   Drug use: No   Sexual activity: Yes    Birth control/protection: Condom  Other Topics Concern   Not on file  Social History Narrative   Not on file   Social Determinants of Health   Financial Resource Strain: Not on file  Food Insecurity: Not on file  Transportation Needs: Not on file  Physical Activity: Not on file  Stress: Not on file  Social Connections: Not  on file   Outpatient Encounter Medications as of 08/31/2022  Medication Sig   ARIPiprazole (ABILIFY) 2 MG tablet Take 1 tablet (2 mg total) by mouth daily.   atorvastatin (LIPITOR) 20 MG tablet Take 1 tablet (20 mg total) by mouth daily.   Continuous Blood Gluc Receiver (DEXCOM G6 RECEIVER) DEVI Use to monitor glucose   Continuous Blood Gluc Sensor (DEXCOM G6 SENSOR) MISC APPLY 1 SENSOR AS DIRECTED, AND CHANGE EVERY 10 DAYS.   Continuous Blood Gluc Transmit (DEXCOM G6 TRANSMITTER) MISC Change transmitter every 90 days as directed.   desvenlafaxine (PRISTIQ) 100 MG 24 hr tablet Take 1 tablet (100 mg total) by mouth daily.   Insulin Disposable Pump (OMNIPOD 5 G6 PODS, GEN 5,) MISC APPLY 1 POD AS DIRECTED AND, REPLACE POD EVERY 48 TO 72 HOURS.   insulin lispro (HUMALOG) 100 UNIT/ML injection USE WITH PUMP FOR TDD AROUND 50 UNITS DAILY   pregabalin (  LYRICA) 100 MG capsule Take 100 mg by mouth 3 (three) times daily.   sildenafil (VIAGRA) 50 MG tablet Take 100 mg by mouth daily as needed for erectile dysfunction.   Vitamin D, Ergocalciferol, (DRISDOL) 1.25 MG (50000 UNIT) CAPS capsule TAKE 1 CAPSULE BY MOUTH ONCE A WEEK   [DISCONTINUED] levothyroxine (SYNTHROID) 137 MCG tablet Take 1 tablet (137 mcg total) by mouth daily before breakfast.   [DISCONTINUED] Pancrelipase, Lip-Prot-Amyl, 25000-79000 units CPEP Take 2 capsules before breakfast, lunch, and supper.  Take 1 tab prior to snacks.   levothyroxine (SYNTHROID) 100 MCG tablet Take 1 tablet (100 mcg total) by mouth daily before breakfast.   Pancrelipase, Lip-Prot-Amyl, 25000-79000 units CPEP Take 2 capsules before breakfast, lunch, and supper.  Take 1 capsule prior to snacks. TDD around 9 capsules per day   [DISCONTINUED] GLOBAL EASE INJECT PEN NEEDLES 31G X 5 MM MISC Inject into the skin. (Patient not taking: Reported on 02/07/2022)   [DISCONTINUED] Pancrelipase, Lip-Prot-Amyl, 25000-79000 units CPEP Take 2 capsules before breakfast, lunch, and supper.   Take 1 capsule prior to snacks. TDD around 9 capsules per day   No facility-administered encounter medications on file as of 08/31/2022.    ALLERGIES: Allergies  Allergen Reactions   Tomato Hives, Itching and Rash   Penicillins Other (See Comments) and Rash    UNKNOWN REACTION    VACCINATION STATUS:  There is no immunization history on file for this patient.  Diabetes He presents for his follow-up diabetic visit. He has type 1 diabetes mellitus. Onset time: He was diagnosed at approximate age of 8 years. His disease course has been worsening. There are no hypoglycemic associated symptoms. Pertinent negatives for hypoglycemia include no confusion, dizziness, nervousness/anxiousness or pallor. Associated symptoms include fatigue. Pertinent negatives for diabetes include no chest pain, no polydipsia, no polyphagia, no polyuria, no weakness and no weight loss. There are no hypoglycemic complications. Symptoms are stable. Diabetic complications include impotence and peripheral neuropathy. Risk factors for coronary artery disease include diabetes mellitus, tobacco exposure, sedentary lifestyle, family history, male sex and dyslipidemia. Current diabetic treatment includes insulin pump. He is compliant with treatment some of the time. His weight is increasing steadily. He is following a generally unhealthy diet. Meal planning includes carbohydrate counting and ADA exchanges. He has not had a previous visit with a dietitian. He rarely participates in exercise. His home blood glucose trend is increasing rapidly. His breakfast blood glucose range is generally >200 mg/dl. His lunch blood glucose range is generally >200 mg/dl. His dinner blood glucose range is generally >200 mg/dl. His bedtime blood glucose range is generally >200 mg/dl. His overall blood glucose range is >200 mg/dl. (He presents today, accompanied by his wife, with his Omnipod showing gross hyperglycemia overall.  POCT A1c today is 10.4%,  increasing from last visit of 9.5%.  During evaluation, it was noticed that his pump settings were way off from the original prescribed settings, apparently they had to re-enter settings on a new PDM and they were put in incorrectly.  He has also NOT been bolusing before meals as recommended.  Analysis of his CGM shows TIR 14%, TAR 86%, TBR 0% with a GMI of 9.9%.) An ACE inhibitor/angiotensin II receptor blocker is not being taken. He does not see a podiatrist.Eye exam is current.  Thyroid Problem Presents for follow-up visit. The condition has lasted for  years. Symptoms include diarrhea and fatigue. Patient reports no anxiety, cold intolerance, constipation, heat intolerance, palpitations, weight gain or weight loss. (Joint aches)  The symptoms have been stable. Past treatments include levothyroxine. The treatment provided mild relief. His past medical history is significant for diabetes and hyperlipidemia. Risk factors include family history of hypothyroidism.  Erectile Dysfunction This is a chronic problem. The current episode started more than 1 year ago. The problem has been gradually worsening since onset. The nature of his difficulty is achieving erection and maintaining erection. He reports no anxiety or decreased libido. Irritative symptoms do not include urgency. Pertinent negatives include no chills, dysuria or hematuria. Past treatments include nothing. Risk factors include diabetes mellitus, penile trauma and tobacco use (was stepped on by a horse as a teenager in the groin causing trauma).  Hyperlipidemia This is a chronic problem. The current episode started more than 1 year ago. The problem is uncontrolled. Recent lipid tests were reviewed and are high. Exacerbating diseases include diabetes and hypothyroidism. There are no known factors aggravating his hyperlipidemia. Pertinent negatives include no chest pain or myalgias. Current antihyperlipidemic treatment includes statins. There are no  compliance problems.  Risk factors for coronary artery disease include diabetes mellitus, dyslipidemia, male sex and a sedentary lifestyle.    Review of systems  Constitutional: + increasing body weight,  current Body mass index is 21.36 kg/m. , no fatigue, no subjective hyperthermia, no subjective hypothermia Eyes: no blurry vision, no xerophthalmia ENT: no sore throat, no nodules palpated in throat, no dysphagia/odynophagia, no hoarseness Cardiovascular: no chest pain, no shortness of breath, no palpitations, no leg swelling Respiratory: no cough, no shortness of breath Gastrointestinal: no nausea/vomiting/diarrhea Musculoskeletal: + generalized muscle/joint aches, walks with cane Skin: no rashes, no hyperemia Neurological: no tremors, no numbness, no tingling, no dizziness Psychiatric: no depression, no anxiety  Objective:    BP 110/72 (BP Location: Right Arm, Patient Position: Sitting, Cuff Size: Normal)   Pulse 85   Ht 5' 4.25" (1.632 m)   Wt 125 lb 6.4 oz (56.9 kg)   BMI 21.36 kg/m   Wt Readings from Last 3 Encounters:  08/31/22 125 lb 6.4 oz (56.9 kg)  06/01/22 121 lb (54.9 kg)  02/07/22 120 lb 3.2 oz (54.5 kg)    BP Readings from Last 3 Encounters:  08/31/22 110/72  07/03/22 110/70  06/01/22 124/75     Physical Exam- Limited  Constitutional:  Body mass index is 21.36 kg/m. , not in acute distress, normal state of mind Eyes:  EOMI, no exophthalmos Musculoskeletal: no gross deformities, strength intact in all four extremities, no gross restriction of joint movements, walks with cane Skin:  no rashes, no hyperemia, extensive tattoos Neurological: no tremor with outstretched hands     CMP ( most recent) CMP     Component Value Date/Time   NA 136 02/02/2021 0935   K 4.4 02/02/2021 0935   CL 96 02/02/2021 0935   CO2 23 02/02/2021 0935   GLUCOSE 428 (H) 02/02/2021 0935   GLUCOSE 177 (H) 05/25/2020 0849   BUN 8 02/02/2021 0935   CREATININE 0.99 02/02/2021  0935   CREATININE 0.87 07/15/2018 1111   CALCIUM 9.7 02/02/2021 0935   PROT 7.3 02/02/2021 0935   ALBUMIN 5.2 (H) 02/02/2021 0935   AST 27 02/02/2021 0935   ALT 19 02/02/2021 0935   ALKPHOS 187 (H) 02/02/2021 0935   BILITOT 0.4 02/02/2021 0935   GFRNONAA >60 05/25/2020 0849   GFRNONAA 109 07/15/2018 1111   GFRAA >60 12/23/2019 1050   GFRAA 127 07/15/2018 1111    Diabetic Labs (most recent): Lab Results  Component Value Date  HGBA1C 10.4 (A) 08/31/2022   HGBA1C 9.5 (A) 06/01/2022   HGBA1C 9.2 (A) 02/07/2022   MICROALBUR 30 10/02/2021   MICROALBUR 7.3 (H) 12/23/2019   MICROALBUR 0.3 07/15/2018     Lab Results  Component Value Date   TSH 0.047 (L) 08/27/2022   TSH 0.115 (L) 05/31/2022   TSH 220.000 (H) 02/27/2022   TSH 298.000 (H) 02/02/2021   TSH 263.000 (H) 07/19/2020   TSH 181.194 (H) 12/23/2019   TSH 14.02 (H) 07/15/2018   TSH 134.12 (H) 08/11/2015   TSH 32.191 (H) 10/30/2011   FREET4 1.85 (H) 08/27/2022   FREET4 1.82 (H) 05/31/2022   FREET4 0.20 (L) 02/27/2022   FREET4 0.21 (L) 02/02/2021   FREET4 0.23 (L) 07/19/2020   FREET4 0.37 (L) 12/23/2019   FREET4 1.3 07/15/2018   FREET4 0.45 (L) 11/01/2011    Lipid Panel     Component Value Date/Time   CHOL 183 05/25/2020 0849   TRIG 60 05/25/2020 0849   HDL 63 05/25/2020 0849   CHOLHDL 2.9 05/25/2020 0849   VLDL 12 05/25/2020 0849   LDLCALC 108 (H) 05/25/2020 0849   LDLCALC 63 07/15/2018 1111    Assessment & Plan:   1) Uncontrolled type 1 diabetes mellitus with hyperglycemia (HCC) - Kevin Cooper. has currently uncontrolled symptomatic type 1 DM since 42 years of age.  He presents today, accompanied by his wife, with his Omnipod showing gross hyperglycemia overall.  POCT A1c today is 10.4%, increasing from last visit of 9.5%.  During evaluation, it was noticed that his pump settings were way off from the original prescribed settings, apparently they had to re-enter settings on a new PDM and they were put in  incorrectly.  He has also NOT been bolusing before meals as recommended.  Analysis of his CGM shows TIR 14%, TAR 86%, TBR 0% with a GMI of 9.9%.  -his diabetes is complicated by peripheral neuropathy, tobacco use/abuse and he remains at a high risk for more acute and chronic complications which include CAD, CVA, CKD, retinopathy, and neuropathy. These are all discussed in detail with him.  - Nutritional counseling repeated at each appointment due to patients tendency to fall back in to old habits.  - The patient admits there is a room for improvement in their diet and drink choices. -  Suggestion is made for the patient to avoid simple carbohydrates from their diet including Cakes, Sweet Desserts / Pastries, Ice Cream, Soda (diet and regular), Sweet Tea, Candies, Chips, Cookies, Sweet Pastries, Store Bought Juices, Alcohol in Excess of 1-2 drinks a day, Artificial Sweeteners, Coffee Creamer, and "Sugar-free" Products. This will help patient to have stable blood glucose profile and potentially avoid unintended weight gain.   - I encouraged the patient to switch to unprocessed or minimally processed complex starch and increased protein intake (animal or plant source), fruits, and vegetables.   - Patient is advised to stick to a routine mealtimes to eat 3 meals a day and avoid unnecessary snacks (to snack only to correct hypoglycemia).  - he has been scheduled with Norm Salt, RDN, CDE for individualized diabetes education previously.  - I have approached him with the following individualized plan to manage diabetes and patient agrees:   -I did assist in putting in correct pump settings for him today, this is likely why his glucose has been out of control recently.  He is encouraged to start bolusing before meals and to stay in automatic mode as much as possible.    -  He is advised to continue monitoring blood glucose at least 4 times a day (using his CGM or backup method), before meals and at  bedtime.  He is also instructed to call the clinic if his blood glucose is less than 70 or greater than 200 for 3 tests in a row.  - he is warned not to take insulin without proper monitoring per orders.  -He is not a candidate for non-insulin medications for diabetes management.  - Patient specific target  A1c;  LDL, HDL, Triglycerides, and  Waist Circumference were discussed in detail.  2) Blood Pressure /Hypertension: His blood pressure is controlled to target.  He is not on any antihypertensives at this time. He will be considered for low dose ACE or ARB if BP is elevated on 3 separate visits.  3) Lipids/Hyperlipidemia:  His most recent lipid panel from 04/23/22 shows controlled LDL at 21.  He is advised to continue Lipitor 20 mg po daily at bedtime.  Side effects and precautions discussed with him.  Will recheck lipid panel prior to next visit.  4)  Weight/Diet:  His Body mass index is 21.36 kg/m.  He is not a candidate for weight loss.  CDE Consult is in process. Exercise, and detailed carbohydrates information provided  -  detailed on discharge instructions.  5) Hypothyroidism His previsit thyroid function tests are consistent with slight over-replacement (possibly due to increased absorption now that he is taking his Creon properly).  Will lower his Levothyroxine to 100 mcg po daily before breakfast (wife has some at home, doesn't need new script at this time).   Will repeat thyroid function tests today and adjust further if needed.  - We discussed about the correct intake of his thyroid hormone, on empty stomach at fasting, with water, separated by at least 30 minutes from breakfast and other medications,  and separated by more than 4 hours from calcium, iron, multivitamins, acid reflux medications (PPIs). -Patient is made aware of the fact that thyroid hormone replacement is needed for life, dose to be adjusted by periodic monitoring of thyroid function tests.  6) Vitamin D  deficiency: His recent vitamin D level from 02/02/21 was low at 11.1.  He has finished Ergocalciferol 50000 units weekly x 12 weeks.  I encouraged him to start OTC Vitamin D 5000 units daily as maintenance dose, will recheck Vitamin D level prior to next visit.  7) Chronic Care/Health Maintenance: -he is not on ACEI/ARB and is on Statin medications and is encouraged to initiate and continue to follow up with Ophthalmology, Dentist,  Podiatrist at least yearly or according to recommendations, and advised to quit tobacco use/abuse. I have recommended yearly flu vaccine and pneumonia vaccine at least every 5 years; moderate intensity exercise for up to 150 minutes weekly; and  sleep for at least 7 hours a day.  - he is advised to maintain close follow up with Golden Pop, FNP for primary care needs, as well as his other providers for optimal and coordinated care.   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  He notes the Creon seems to be helping substantially, he is advised to continue.       I spent  51  minutes in the care of the patient today including review of labs from CMP, Lipids, Thyroid Function, Hematology (current and previous including abstractions from other facilities); face-to-face time discussing  his blood glucose readings/logs, discussing hypoglycemia  and hyperglycemia episodes and symptoms, medications doses, his options of short and long term treatment based on the latest standards of care / guidelines;  discussion about incorporating lifestyle medicine;  and documenting the encounter. Risk reduction counseling performed per USPSTF guidelines to reduce obesity and cardiovascular risk factors.     Please refer to Patient Instructions for Blood Glucose Monitoring and Insulin/Medications Dosing Guide"  in media tab for additional information. Please  also refer to " Patient Self Inventory" in the Media  tab for  reviewed elements of pertinent patient history.  Kevin CanalesJerry Raupp Jr. participated in the discussions, expressed understanding, and voiced agreement with the above plans.  All questions were answered to his satisfaction. he is encouraged to contact clinic should he have any questions or concerns prior to his return visit.     Follow up plan: - Return in about 3 months (around 11/30/2022) for Diabetes F/U with A1c in office, Bring meter and logs, Previsit labs.  Kevin Cooper, [facility] CampusFNP-BC [facility] [address] [place], [place] [address] Phone: [phone] Fax: [phone]   08/31/2022, 9:38 AM

## 2022-09-04 ENCOUNTER — Ambulatory Visit (INDEPENDENT_AMBULATORY_CARE_PROVIDER_SITE_OTHER): Payer: 59 | Admitting: Clinical

## 2022-09-04 DIAGNOSIS — F331 Major depressive disorder, recurrent, moderate: Secondary | ICD-10-CM

## 2022-09-04 DIAGNOSIS — F1021 Alcohol dependence, in remission: Secondary | ICD-10-CM | POA: Diagnosis not present

## 2022-09-04 DIAGNOSIS — F419 Anxiety disorder, unspecified: Secondary | ICD-10-CM

## 2022-09-04 NOTE — Progress Notes (Signed)
Virtual Visit via Video Note  I connected with Kevin Cooper. on 09/04/22 at 11:00 AM EDT by a video enabled telemedicine application and verified that I am speaking with the correct person using two identifiers.  Location: Patient: Home Provider: Office   I discussed the limitations of evaluation and management by telemedicine and the availability of in person appointments. The patient expressed understanding and agreed to proceed.  THERAPIST PROGRESS NOTE   Session Time: 11:00 AM-11:45 AM   Participation Level: Active   Behavioral Response: CasualAlertDepressed/Irratable   Type of Therapy: Individual Therapy   Treatment Goals addressed: Coping for MH diagnosis   Interventions: CBT, Solution Focused, Strength-based, Supportive and Social Skills Training   Summary: Kevin Cooper. is a 42 y.o. 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 The OPT therapist worked with the patient for his ongoing treatment. The OPT therapist utilized Motivational Interviewing to assist in creating therapeutic repore. The patient in the session was engaged and work in collaboration giving feedback about his triggers and symptoms over the past few weeks.The patient spoke about difficulty from conflict within his relationship over his partner not trusting him , going through his phone. The OPT therapist utilized Cognitive Behavioral Therapy through cognitive restructuring as well as worked with the patient on coping strategies to assist in management of mood, improve coping, and improve positive thinking while empowering the patient. The OPT therapist worked with the patient on his basic health needs in the areas of sleep, eating, hygiene, and physical health.The OPT therapist worked on exercise fact checking and worked with the patient on challenging negative thoughts. The patient spoke about being able to manage mood better with his medication and he has realized there are things in his  control and not in his control and is doing better currently with working on things in his control. The OPT therapist worked with the patient on communication within his relationship and working with his partner on building a healthier relationship.   Suicidal/Homicidal: Nowithout intent/plan   Therapist Response: The OPT therapist worked with the patient for the patients  scheduled session. The patient was engaged in his session and gave feedback in relation to triggers, symptoms, and behavior responses over the past few weeks. The OPT therapist worked with the patient utilizing an in session Cognitive Behavioral Therapy exercise. The patient spoke about realized benefits from involvement with med management. The patient was responsive in the session and verbalized, " Things overall have been going well just me and my partner have been in conflict and its over mostly just her not trusting me and going through my phone and we have said we are going to let things from our past". The OPT therapist talked with the patient reviewing coping skills to assist with managing mood and giving encouragement to continue to implement to assist with leveling his mood. The patient worked in session with the OPT therapist on communication within his relationship and working towards a healthier relationship.The OPT therapist will continue treatment work with the patient in his next scheduled session.     Plan: Return again in 3 weeks.   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 Adrian Blackwater.   Patient/Guardian was advised Release of Information must be obtained prior to any record release in order to collaborate their care with an outside provider. Patient/Guardian was advised if they have not already done so to contact the registration  department to sign all necessary forms in order for [place] to release information regarding their care.    Consent: Patient/Guardian gives verbal consent for treatment and assignment of benefits for services provided during this visit. Patient/Guardian expressed understanding and agreed to proceed.    I discussed the assessment and treatment plan with the patient. The patient was provided an opportunity to ask questions and all were answered. The patient agreed with the plan and demonstrated an understanding of the instructions.   The patient was advised to call back or seek an in-person evaluation if the symptoms worsen or if the condition fails to improve as anticipated.   I provided 45 minutes of non-face-to-face time during this encounter.   [name], LCSW   09/04/2022

## 2022-09-05 ENCOUNTER — Other Ambulatory Visit: Payer: Self-pay | Admitting: *Deleted

## 2022-09-05 DIAGNOSIS — E039 Hypothyroidism, unspecified: Secondary | ICD-10-CM

## 2022-09-05 MED ORDER — LEVOTHYROXINE SODIUM 100 MCG PO TABS
100.0000 ug | ORAL_TABLET | Freq: Every day | ORAL | 1 refills | Status: DC
Start: 2022-09-05 — End: 2022-09-12

## 2022-09-05 NOTE — Telephone Encounter (Signed)
Patient was called and made aware that the thyroid medication  100 mcg was being sent to [facility].

## 2022-09-12 ENCOUNTER — Other Ambulatory Visit: Payer: Self-pay

## 2022-09-12 DIAGNOSIS — E039 Hypothyroidism, unspecified: Secondary | ICD-10-CM

## 2022-09-12 MED ORDER — LEVOTHYROXINE SODIUM 100 MCG PO TABS
100.0000 ug | ORAL_TABLET | Freq: Every day | ORAL | 1 refills | Status: DC
Start: 2022-09-12 — End: 2022-12-12

## 2022-09-19 NOTE — Telephone Encounter (Signed)
Pt's wife said the pharmacy is stating that they never received this RX . Can you re send it , please to [facility]

## 2022-10-03 ENCOUNTER — Ambulatory Visit: Payer: 59 | Admitting: Nurse Practitioner

## 2022-10-09 ENCOUNTER — Ambulatory Visit (INDEPENDENT_AMBULATORY_CARE_PROVIDER_SITE_OTHER): Payer: 59 | Admitting: Clinical

## 2022-10-09 DIAGNOSIS — F1021 Alcohol dependence, in remission: Secondary | ICD-10-CM

## 2022-10-09 DIAGNOSIS — F1090 Alcohol use, unspecified, uncomplicated: Secondary | ICD-10-CM

## 2022-10-09 DIAGNOSIS — F331 Major depressive disorder, recurrent, moderate: Secondary | ICD-10-CM | POA: Diagnosis not present

## 2022-10-09 DIAGNOSIS — F419 Anxiety disorder, unspecified: Secondary | ICD-10-CM

## 2022-10-09 NOTE — Progress Notes (Signed)
Virtual Visit via Video Note   I connected with Kevin Cooper. on 10/09/22 at 11:00 AM EDT by a video enabled telemedicine application and verified that I am speaking with the correct person using two identifiers.   Location: Patient: Home Provider: Office   I discussed the limitations of evaluation and management by telemedicine and the availability of in person appointments. The patient expressed understanding and agreed to proceed.   THERAPIST PROGRESS NOTE   Session Time: 11:00 AM-11:45 AM   Participation Level: Active   Behavioral Response: CasualAlertDepressed/Irratable   Type of Therapy: Individual Therapy   Treatment Goals addressed: Coping for MH diagnosis   Interventions: CBT, Solution Focused, Strength-based, Supportive and Social Skills Training   Summary: Kevin Cooper. is a 42 y.o. 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 The OPT therapist worked with the patient for his ongoing treatment. The OPT therapist utilized Motivational Interviewing to assist in creating therapeutic repore. The patient in the session was engaged and work in collaboration giving feedback about his triggers and symptoms over the past few weeks.The patient spoke about his partner getting a job taking care of him and this adding a stream of income and helping to reduce financial stress. The OPT therapist utilized Cognitive Behavioral Therapy through cognitive restructuring as well as worked with the patient on coping strategies to assist in management of mood, improve coping, and improve positive thinking while empowering the patient. The OPT therapist worked with the patient on his basic health needs in the areas of sleep, eating, hygiene, and physical health.The OPT therapist worked on exercise fact checking and worked with the patient on challenging negative thoughts. The patient spoke about being able to manage mood better with his medication and he has realized there are  things in his control and not in his control and is doing better currently with working on things in his control. The OPT therapist worked with the patient on communication within his relationship and working with his partner on building a healthier relationship. The patient is currently examining options for hobbies and will be starting gardening. 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 The patient is going to be adding Pain Management as part of his overall health treatment.    Suicidal/Homicidal: Nowithout intent/plan   Therapist Response: The OPT therapist worked with the patient for the patients  scheduled session. The patient was engaged in his session and gave feedback in relation to triggers, symptoms, and behavior responses over the past few weeks. The OPT therapist worked with the patient utilizing an in session Cognitive Behavioral Therapy exercise. The patient spoke about realized benefits from involvement with med management. The patient was responsive in the session and verbalized, " I have been decreasing my alcohol use but I do that to help me relax and manage pain, but I want to try to stop drinking on a daily basis and I am hoping with Pain Management we can find something that helps so I can get off of drinking". The OPT therapist talked with the patient reviewing coping skills to assist with managing mood and giving encouragement to continue to implement to assist with leveling his mood. The patient worked in session with the OPT therapist on communication within his relationship and working towards a healthier relationship.The OPT therapist will continue treatment work with the patient in his next scheduled session.     Plan: Return again in 3 weeks.  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 Adrian Blackwater.   Patient/Guardian was advised Release of Information must be obtained prior to any record release in order to collaborate their care with an outside provider. Patient/Guardian was advised if they have not already done so to contact the registration department to sign all necessary forms in order for [place] to release information regarding their care.    Consent: Patient/Guardian gives verbal consent for treatment and assignment of benefits for services provided during this visit. Patient/Guardian expressed understanding and agreed to proceed.    I discussed the assessment and treatment plan with the patient. The patient was provided an opportunity to ask questions and all were answered. The patient agreed with the plan and demonstrated an understanding of the instructions.   The patient was advised to call back or seek an in-person evaluation if the symptoms worsen or if the condition fails to improve as anticipated.   I provided 45 minutes of non-face-to-face time during this encounter.   [name], LCSW   10/09/2022

## 2022-10-18 ENCOUNTER — Encounter (HOSPITAL_COMMUNITY): Payer: Self-pay

## 2022-10-18 DIAGNOSIS — F411 Generalized anxiety disorder: Secondary | ICD-10-CM

## 2022-10-18 DIAGNOSIS — R45851 Suicidal ideations: Secondary | ICD-10-CM

## 2022-10-18 DIAGNOSIS — F332 Major depressive disorder, recurrent severe without psychotic features: Secondary | ICD-10-CM

## 2022-10-18 MED ORDER — ARIPIPRAZOLE 2 MG PO TABS: 2.0000 mg | ORAL_TABLET | Freq: Every day | ORAL | 0 refills | Status: DC

## 2022-11-07 ENCOUNTER — Other Ambulatory Visit: Payer: Self-pay | Admitting: Nurse Practitioner

## 2022-11-09 ENCOUNTER — Encounter (HOSPITAL_COMMUNITY): Payer: Self-pay | Admitting: Psychiatry

## 2022-11-09 ENCOUNTER — Telehealth (INDEPENDENT_AMBULATORY_CARE_PROVIDER_SITE_OTHER): Payer: 59 | Admitting: Psychiatry

## 2022-11-09 DIAGNOSIS — R45851 Suicidal ideations: Secondary | ICD-10-CM | POA: Diagnosis not present

## 2022-11-09 DIAGNOSIS — F17200 Nicotine dependence, unspecified, uncomplicated: Secondary | ICD-10-CM

## 2022-11-09 DIAGNOSIS — F332 Major depressive disorder, recurrent severe without psychotic features: Secondary | ICD-10-CM | POA: Diagnosis not present

## 2022-11-09 DIAGNOSIS — F410 Panic disorder [episodic paroxysmal anxiety] without agoraphobia: Secondary | ICD-10-CM

## 2022-11-09 DIAGNOSIS — F411 Generalized anxiety disorder: Secondary | ICD-10-CM

## 2022-11-09 MED ORDER — HYDROXYZINE HCL 25 MG PO TABS
25.0000 mg | ORAL_TABLET | Freq: Two times a day (BID) | ORAL | 1 refills | Status: DC | PRN
Start: 2022-11-09 — End: 2022-12-10

## 2022-11-09 MED ORDER — ARIPIPRAZOLE 5 MG PO TABS
5.0000 mg | ORAL_TABLET | Freq: Every day | ORAL | 2 refills | Status: DC
Start: 2022-11-09 — End: 2023-01-10

## 2022-11-09 MED ORDER — DESVENLAFAXINE SUCCINATE ER 100 MG PO TB24: 100.0000 mg | ORAL_TABLET | Freq: Every day | ORAL | 2 refills | Status: DC

## 2022-11-09 NOTE — Patient Instructions (Signed)
We increased the Abilify to 5 mg once daily today.  We also added hydroxyzine 25 mg twice daily as needed for panic attack.  If you can, please check in with your PCP or endocrinologist to let them know about the panic attacks and we may want to get an updated EKG from your PCP just to rule out any thyroid induced arrhythmias.
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
## 2022-11-12 ENCOUNTER — Telehealth: Payer: Self-pay | Admitting: Nurse Practitioner

## 2022-11-12 NOTE — Telephone Encounter (Signed)
Can someone call and let him know this?

## 2022-11-12 NOTE — Telephone Encounter (Signed)
He does not need any pump adjustments.  I pulled his report and see that 89% of his insulin needs are coming from the basal rate and it should be split evenly 50/50.  He is hardly bolusing at all.   This means he needs to calculate his carbs prior to eating, input the carbs into the Omnipod and bolus for a meal PRIOR to eating.  This will help bring meal time highs down.

## 2022-11-12 NOTE — Telephone Encounter (Signed)
States that pt thinks he needs his insulin adjusted.  Says his omnipod is connected to you so you should be able to see his readings.

## 2022-11-13 ENCOUNTER — Ambulatory Visit (HOSPITAL_COMMUNITY): Payer: 59 | Admitting: Clinical

## 2022-11-13 NOTE — Telephone Encounter (Signed)
Patient was called and a message was left with Kevin Cooper's instructions as noted in her response.

## 2022-11-26 ENCOUNTER — Ambulatory Visit (INDEPENDENT_AMBULATORY_CARE_PROVIDER_SITE_OTHER): Payer: 59 | Admitting: Clinical

## 2022-11-26 DIAGNOSIS — F419 Anxiety disorder, unspecified: Secondary | ICD-10-CM

## 2022-11-26 DIAGNOSIS — F331 Major depressive disorder, recurrent, moderate: Secondary | ICD-10-CM | POA: Diagnosis not present

## 2022-11-26 DIAGNOSIS — F1021 Alcohol dependence, in remission: Secondary | ICD-10-CM

## 2022-11-26 NOTE — Progress Notes (Signed)
Virtual Visit via Video Note   I connected with Kevin Cooper. on 11/26/22 at 8:00 AM EDT by a video enabled telemedicine application and verified that I am speaking with the correct person using two identifiers.   Location: Patient: Home Provider: Office   I discussed the limitations of evaluation and management by telemedicine and the availability of in person appointments. The patient expressed understanding and agreed to proceed.   THERAPIST PROGRESS NOTE   Session Time: 8:00 AM-8:30 AM   Participation Level: Active   Behavioral Response: CasualAlertDepressed/Irratable   Type of Therapy: Individual Therapy   Treatment Goals addressed: Coping for MH diagnosis   Interventions: CBT, Solution Focused, Strength-based, Supportive and Social Skills Training   Summary: Kevin Yung. is a 42 y.o. 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 The OPT therapist worked with the patient for his ongoing treatment. The OPT therapist utilized Motivational Interviewing to assist in creating therapeutic repore. The patient in the session was engaged and work in collaboration giving feedback about his triggers and symptoms over the past few weeks.The patient spoke about his partner getting a job with the focus currently to save for a car as they have been borrowing a family members car for transportation, as well as the income going to current house remodeling. The OPT therapist utilized Cognitive Behavioral Therapy through cognitive restructuring as well as worked with the patient on coping strategies to assist in management of mood, improve coping, and improve positive thinking while empowering the patient. The OPT therapist worked with the patient on his basic health needs in the areas of sleep, eating, hygiene, and physical health.The OPT therapist worked on exercise fact checking and worked with the patient on challenging negative thoughts. The patient has been doing better in  working with his partner on building a healthier relationship. 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 The patient spoke about his plans for the upcoming 4th of July holiday and spending time with family.  Suicidal/Homicidal: Nowithout intent/plan   Therapist Response: The OPT therapist worked with the patient for the patients  scheduled session. The patient was engaged in his session and gave feedback in relation to triggers, symptoms, and behavior responses over the past few weeks. The OPT therapist worked with the patient utilizing an in session Cognitive Behavioral Therapy exercise. The patient spoke about realized benefits from involvement with med management. The patient was responsive in the session and verbalized, " I have been more active in trying to stay busy indside the house during the day and trying not to be ridged in getting things accomplished and that flexability like we talked about". The OPT therapist talked with the patient reviewing coping skills to assist with managing mood and giving encouragement to continue to implement to assist with leveling his mood. The patient worked in session with the OPT therapist on communication within his relationship and noted progress within his interaction in his relationship.The OPT therapist will continue treatment work with the patient in his next scheduled session.     Plan: Return again in 3 weeks.   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 Adrian Blackwater.   Patient/Guardian was advised Release of Information must be obtained prior to any record release in order to collaborate their care with an outside provider. Patient/Guardian was advised if they have not already done so to contact the registration department to sign all  necessary forms in order for [place] to release information regarding their care.    Consent: Patient/Guardian gives verbal consent for treatment and assignment of benefits for services provided during this visit. Patient/Guardian expressed understanding and agreed to proceed.    I discussed the assessment and treatment plan with the patient. The patient was provided an opportunity to ask questions and all were answered. The patient agreed with the plan and demonstrated an understanding of the instructions.   The patient was advised to call back or seek an in-person evaluation if the symptoms worsen or if the condition fails to improve as anticipated.   I provided 45 minutes of non-face-to-face time during this encounter.   Kevin Burn, LCSW   11/26/2022

## 2022-12-10 ENCOUNTER — Telehealth (HOSPITAL_COMMUNITY): Payer: 59 | Admitting: Psychiatry

## 2022-12-10 ENCOUNTER — Encounter (HOSPITAL_COMMUNITY): Payer: Self-pay | Admitting: Psychiatry

## 2022-12-10 DIAGNOSIS — F411 Generalized anxiety disorder: Secondary | ICD-10-CM | POA: Diagnosis not present

## 2022-12-10 DIAGNOSIS — E889 Metabolic disorder, unspecified: Secondary | ICD-10-CM

## 2022-12-10 DIAGNOSIS — F17200 Nicotine dependence, unspecified, uncomplicated: Secondary | ICD-10-CM

## 2022-12-10 DIAGNOSIS — F410 Panic disorder [episodic paroxysmal anxiety] without agoraphobia: Secondary | ICD-10-CM

## 2022-12-10 DIAGNOSIS — R45851 Suicidal ideations: Secondary | ICD-10-CM

## 2022-12-10 DIAGNOSIS — F332 Major depressive disorder, recurrent severe without psychotic features: Secondary | ICD-10-CM

## 2022-12-10 DIAGNOSIS — Z79899 Other long term (current) drug therapy: Secondary | ICD-10-CM

## 2022-12-10 DIAGNOSIS — G63 Polyneuropathy in diseases classified elsewhere: Secondary | ICD-10-CM

## 2022-12-10 MED ORDER — HYDROXYZINE HCL 50 MG PO TABS
50.0000 mg | ORAL_TABLET | Freq: Two times a day (BID) | ORAL | 2 refills | Status: DC | PRN
Start: 2022-12-10 — End: 2023-02-05
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
## 2022-12-10 NOTE — Patient Instructions (Signed)
We increased the hydroxyzine to 50 mg twice daily as needed for anxiety or insomnia.  We otherwise kept her medications the same.

## 2022-12-11 LAB — COMPREHENSIVE METABOLIC PANEL
ALT: 24 IU/L (ref 0–44)
AST: 40 IU/L (ref 0–40)
Albumin: 4.7 g/dL (ref 4.1–5.1)
Alkaline Phosphatase: 168 IU/L — ABNORMAL HIGH (ref 44–121)
BUN/Creatinine Ratio: 14 (ref 9–20)
BUN: 13 mg/dL (ref 6–24)
Bilirubin Total: 0.3 mg/dL (ref 0.0–1.2)
CO2: 21 mmol/L (ref 20–29)
Calcium: 9.2 mg/dL (ref 8.7–10.2)
Chloride: 100 mmol/L (ref 96–106)
Creatinine, Ser: 0.9 mg/dL (ref 0.76–1.27)
Globulin, Total: 2.4 g/dL (ref 1.5–4.5)
Glucose: 114 mg/dL — ABNORMAL HIGH (ref 70–99)
Potassium: 4.4 mmol/L (ref 3.5–5.2)
Sodium: 137 mmol/L (ref 134–144)
Total Protein: 7.1 g/dL (ref 6.0–8.5)
eGFR: 109 mL/min/{1.73_m2} (ref >59)

## 2022-12-11 LAB — LIPID PANEL
Chol/HDL Ratio: 1.5 ratio (ref 0.0–5.0)
Cholesterol, Total: 135 mg/dL (ref 100–199)
HDL: 88 mg/dL (ref >39)
LDL Chol Calc (NIH): 36 mg/dL (ref 0–99)
Triglycerides: 47 mg/dL (ref 0–149)
VLDL Cholesterol Cal: 11 mg/dL (ref 5–40)

## 2022-12-11 LAB — T4, FREE: Free T4: 1.17 ng/dL (ref 0.82–1.77)

## 2022-12-11 LAB — TSH: TSH: 0.937 u[IU]/mL (ref 0.450–4.500)

## 2022-12-11 LAB — VITAMIN D 25 HYDROXY (VIT D DEFICIENCY, FRACTURES): Vit D, 25-Hydroxy: 30 ng/mL (ref 30.0–100.0)

## 2022-12-12 ENCOUNTER — Encounter: Payer: Self-pay | Admitting: Nurse Practitioner

## 2022-12-12 ENCOUNTER — Ambulatory Visit (INDEPENDENT_AMBULATORY_CARE_PROVIDER_SITE_OTHER): Payer: 59 | Admitting: Nurse Practitioner

## 2022-12-12 VITALS — BP 101/62 | HR 91 | Ht 64.25 in | Wt 138.4 lb

## 2022-12-12 DIAGNOSIS — E782 Mixed hyperlipidemia: Secondary | ICD-10-CM | POA: Diagnosis not present

## 2022-12-12 DIAGNOSIS — E1065 Type 1 diabetes mellitus with hyperglycemia: Secondary | ICD-10-CM | POA: Diagnosis not present

## 2022-12-12 DIAGNOSIS — E559 Vitamin D deficiency, unspecified: Secondary | ICD-10-CM | POA: Diagnosis not present

## 2022-12-12 DIAGNOSIS — E039 Hypothyroidism, unspecified: Secondary | ICD-10-CM | POA: Diagnosis not present

## 2022-12-12 LAB — POCT GLYCOSYLATED HEMOGLOBIN (HGB A1C): Hemoglobin A1C: 8.5 % — AB (ref 4.0–5.6)

## 2022-12-12 MED ORDER — INSULIN LISPRO 100 UNIT/ML IJ SOLN: INTRAMUSCULAR | 1 refills | Status: DC

## 2022-12-12 MED ORDER — LEVOTHYROXINE SODIUM 100 MCG PO TABS
100.0000 ug | ORAL_TABLET | Freq: Every day | ORAL | 1 refills | Status: DC
Start: 2022-12-12 — End: 2023-05-23

## 2022-12-12 MED ORDER — OMNIPOD 5 DEXG7G6 PODS GEN 5 MISC: 3 refills | Status: DC

## 2022-12-12 NOTE — Progress Notes (Signed)
Endocrinology follow-up note       12/12/2022, 10:07 AM   Subjective:    Patient ID: Kevin Cooper., male    DOB: May 14, 1981.  Kevin Cooper. is being seen in follow-up for management of currently uncontrolled symptomatic type 1 diabetes, hypothyroidism. PMD:   Golden Pop, FNP.   Past Medical History:  Diagnosis Date   Chronic right hip pain    Depression    Diabetes mellitus    diagnosed age 42   Insomnia 06/04/2022   Seizures (HCC)    last one january 2016 due to low bs   Thyroid disease    Vitamin D deficiency 06/04/2022   Past Surgical History:  Procedure Laterality Date   EYE SURGERY Right age 1   Social History   Socioeconomic History   Marital status: Legally Separated    Spouse name: Not on file   Number of children: Not on file   Years of education: Not on file   Highest education level: Not on file  Occupational History   Not on file  Tobacco Use   Smoking status: Former    Current packs/day: 0.00    Average packs/day: 2.0 packs/day for 4.0 years (8.0 ttl pk-yrs)    Types: Cigarettes    Start date: 05/29/1999    Quit date: 05/29/2003    Years since quitting: 19.5   Smokeless tobacco: Current    Types: Chew   Tobacco comments:    3 cans of dip per week.  Previously tried patches, gum, lozenges with lack of effect  Vaping Use   Vaping status: Never Used  Substance and Sexual Activity   Alcohol use: Yes    Comment: Drinking 32 ounces to 1 pint every 2 to 3 weeks as of 11/09/2022.Marland Kitchen  Previously drinking at least 1 shot of liquor nightly, sometimes 2-3.  Other times will have heavier episodes of drinking   Drug use: No   Sexual activity: Yes    Birth control/protection: Condom  Other Topics Concern   Not on file  Social History Narrative   Not on file   Social Determinants of Health   Financial Resource Strain: Not on file  Food Insecurity: Not on file  Transportation Needs:  Not on file  Physical Activity: Not on file  Stress: Not on file  Social Connections: Not on file   Outpatient Encounter Medications as of 12/12/2022  Medication Sig   ARIPiprazole (ABILIFY) 5 MG tablet Take 1 tablet (5 mg total) by mouth daily.   atorvastatin (LIPITOR) 20 MG tablet Take 1 tablet (20 mg total) by mouth daily.   baclofen (LIORESAL) 10 MG tablet Take 10 mg by mouth 3 (three) times daily.   Continuous Blood Gluc Receiver (DEXCOM G6 RECEIVER) DEVI Use to monitor glucose   Continuous Blood Gluc Sensor (DEXCOM G6 SENSOR) MISC APPLY 1 SENSOR AS DIRECTED, AND CHANGE EVERY 10 DAYS.   Continuous Blood Gluc Transmit (DEXCOM G6 TRANSMITTER) MISC Change transmitter every 90 days as directed.   desvenlafaxine (PRISTIQ) 100 MG 24 hr tablet Take 1 tablet (100 mg total) by mouth daily.   hydrOXYzine (ATARAX) 50 MG tablet Take 1 tablet (50 mg total) by mouth  2 (two) times daily as needed (Panic attacks/insomnia).   lidocaine (LIDODERM) 5 % Place onto the skin daily.   oxyCODONE-acetaminophen (PERCOCET) 7.5-325 MG tablet Take 1 tablet by mouth every 8 (eight) hours as needed for severe pain.   Pancrelipase, Lip-Prot-Amyl, (ZENPEP) 25000-79000 units CPEP TAKE 2 CAPSULES BY MOUTH BEFORE  BREAKFAST LUNCH AND SUPPER. TAKE 1 CAPSULE PRIOR TO SNACKS. TOTAL DAILY DOSE AROUND 9 CAPSULES  DAILY   pregabalin (LYRICA) 100 MG capsule Take 100 mg by mouth 3 (three) times daily.   sildenafil (VIAGRA) 50 MG tablet Take 100 mg by mouth daily as needed for erectile dysfunction.   [DISCONTINUED] Insulin Disposable Pump (OMNIPOD 5 G6 PODS, GEN 5,) MISC APPLY 1 POD AS DIRECTED AND, REPLACE POD EVERY 48 TO 72 HOURS.   [DISCONTINUED] insulin lispro (HUMALOG) 100 UNIT/ML injection USE WITH PUMP FOR TDD AROUND 50 UNITS DAILY   [DISCONTINUED] levothyroxine (SYNTHROID) 100 MCG tablet Take 1 tablet (100 mcg total) by mouth daily before breakfast.   Insulin Disposable Pump (OMNIPOD 5 G6 PODS, GEN 5,) MISC APPLY 1 POD AS  DIRECTED AND, REPLACE POD EVERY 48 TO 72 HOURS.   insulin lispro (HUMALOG) 100 UNIT/ML injection USE WITH PUMP FOR TDD AROUND 50 UNITS DAILY   levothyroxine (SYNTHROID) 100 MCG tablet Take 1 tablet (100 mcg total) by mouth daily before breakfast.   [DISCONTINUED] oxyCODONE-acetaminophen (PERCOCET) 7.5-325 MG tablet Take 1 tablet by mouth 3 (three) times daily as needed. (Patient not taking: Reported on 12/12/2022)   [DISCONTINUED] traMADol (ULTRAM) 50 MG tablet Take 50 mg by mouth 3 (three) times daily as needed for moderate pain. (Patient not taking: Reported on 12/12/2022)   No facility-administered encounter medications on file as of 12/12/2022.    ALLERGIES: Allergies  Allergen Reactions   Tomato Hives, Itching and Rash   Penicillins Other (See Comments) and Rash    UNKNOWN REACTION    VACCINATION STATUS:  There is no immunization history on file for this patient.  Diabetes He presents for his follow-up diabetic visit. He has type 1 diabetes mellitus. Onset time: He was diagnosed at approximate age of 8 years. His disease course has been fluctuating. There are no hypoglycemic associated symptoms. Pertinent negatives for hypoglycemia include no confusion, dizziness, nervousness/anxiousness or pallor. Associated symptoms include fatigue. Pertinent negatives for diabetes include no chest pain, no polydipsia, no polyphagia, no polyuria, no weakness and no weight loss. There are no hypoglycemic complications. Symptoms are stable. Diabetic complications include impotence and peripheral neuropathy. Risk factors for coronary artery disease include diabetes mellitus, tobacco exposure, sedentary lifestyle, family history, male sex and dyslipidemia. Current diabetic treatment includes insulin pump. He is compliant with treatment some of the time (does not bolus prior to meals). His weight is increasing steadily. He is following a generally unhealthy diet. Meal planning includes carbohydrate counting and  ADA exchanges. He has not had a previous visit with a dietitian. He rarely participates in exercise. His home blood glucose trend is fluctuating dramatically. His breakfast blood glucose range is generally >200 mg/dl. His lunch blood glucose range is generally >200 mg/dl. His dinner blood glucose range is generally >200 mg/dl. His bedtime blood glucose range is generally >200 mg/dl. His overall blood glucose range is >200 mg/dl. (He presents today, accompanied by his wife, with his Omnipod showing gross hyperglycemia overall.  POCT A1c today is 8.5%, improving from last visit of 10.4%.  He has NOT been bolusing before meals as recommended.  Analysis of his CGM shows TIR 35%, TAR  65%, TBR 0% with a GMI of 9.4%.) An ACE inhibitor/angiotensin II receptor blocker is not being taken. He does not see a podiatrist.Eye exam is current.  Thyroid Problem Presents for follow-up visit. The condition has lasted for  years. Symptoms include diarrhea and fatigue. Patient reports no anxiety, cold intolerance, constipation, heat intolerance, palpitations, weight gain or weight loss. (Joint aches) The symptoms have been stable. Past treatments include levothyroxine. The treatment provided mild relief. His past medical history is significant for diabetes and hyperlipidemia. Risk factors include family history of hypothyroidism.  Erectile Dysfunction This is a chronic problem. The current episode started more than 1 year ago. The problem has been gradually worsening since onset. The nature of his difficulty is achieving erection and maintaining erection. He reports no anxiety or decreased libido. Irritative symptoms do not include urgency. Pertinent negatives include no chills, dysuria or hematuria. Past treatments include nothing. Risk factors include diabetes mellitus, penile trauma and tobacco use (was stepped on by a horse as a teenager in the groin causing trauma).  Hyperlipidemia This is a chronic problem. The current  episode started more than 1 year ago. The problem is uncontrolled. Recent lipid tests were reviewed and are high. Exacerbating diseases include diabetes and hypothyroidism. There are no known factors aggravating his hyperlipidemia. Pertinent negatives include no chest pain or myalgias. Current antihyperlipidemic treatment includes statins. There are no compliance problems.  Risk factors for coronary artery disease include diabetes mellitus, dyslipidemia, male sex and a sedentary lifestyle.    Review of systems  Constitutional: + increasing body weight (good development for him),  current Body mass index is 23.57 kg/m. , no fatigue, no subjective hyperthermia, no subjective hypothermia Eyes: no blurry vision, no xerophthalmia ENT: no sore throat, no nodules palpated in throat, no dysphagia/odynophagia, no hoarseness Cardiovascular: no chest pain, no shortness of breath, no palpitations, no leg swelling Respiratory: no cough, no shortness of breath Gastrointestinal: no nausea/vomiting/diarrhea Musculoskeletal: + generalized muscle/joint aches, walks with cane Skin: no rashes, no hyperemia Neurological: no tremors, no numbness, no tingling, no dizziness Psychiatric: no depression, no anxiety  Objective:    BP 101/62 (BP Location: Left Arm, Patient Position: Sitting, Cuff Size: Normal)   Pulse 91   Ht 5' 4.25" (1.632 m)   Wt 138 lb 6.4 oz (62.8 kg)   BMI 23.57 kg/m   Wt Readings from Last 3 Encounters:  12/12/22 138 lb 6.4 oz (62.8 kg)  08/31/22 125 lb 6.4 oz (56.9 kg)  06/01/22 121 lb (54.9 kg)    BP Readings from Last 3 Encounters:  12/12/22 101/62  08/31/22 110/72  07/03/22 110/70     Physical Exam- Limited  Constitutional:  Body mass index is 23.57 kg/m. , not in acute distress, normal state of mind Eyes:  EOMI, no exophthalmos Musculoskeletal: no gross deformities, strength intact in all four extremities, no gross restriction of joint movements, walks with cane Skin:  no  rashes, no hyperemia, extensive tattoos Neurological: no tremor with outstretched hands   Diabetic Foot Exam - Simple   Simple Foot Form Diabetic Foot exam was performed with the following findings: Yes 12/12/2022  9:26 AM  Visual Inspection See comments: Yes Sensation Testing Intact to touch and monofilament testing bilaterally: Yes Pulse Check Posterior Tibialis and Dorsalis pulse intact bilaterally: Yes Comments Onychomycosis bilaterally, right great toe-healing wound     CMP ( most recent) CMP     Component Value Date/Time   NA 137 12/10/2022 1334   K 4.4 12/10/2022 1334  CL 100 12/10/2022 1334   CO2 21 12/10/2022 1334   GLUCOSE 114 (H) 12/10/2022 1334   GLUCOSE 177 (H) 05/25/2020 0849   BUN 13 12/10/2022 1334   CREATININE 0.90 12/10/2022 1334   CREATININE 0.87 07/15/2018 1111   CALCIUM 9.2 12/10/2022 1334   PROT 7.1 12/10/2022 1334   ALBUMIN 4.7 12/10/2022 1334   AST 40 12/10/2022 1334   ALT 24 12/10/2022 1334   ALKPHOS 168 (H) 12/10/2022 1334   BILITOT 0.3 12/10/2022 1334   GFRNONAA >60 05/25/2020 0849   GFRNONAA 109 07/15/2018 1111   GFRAA >60 12/23/2019 1050   GFRAA 127 07/15/2018 1111    Diabetic Labs (most recent): Lab Results  Component Value Date   HGBA1C 8.5 (A) 12/12/2022   HGBA1C 10.4 (A) 08/31/2022   HGBA1C 9.5 (A) 06/01/2022   MICROALBUR 30 10/02/2021   MICROALBUR 7.3 (H) 12/23/2019   MICROALBUR 0.3 07/15/2018     Lab Results  Component Value Date   TSH 0.937 12/10/2022   TSH 0.047 (L) 08/27/2022   TSH 0.115 (L) 05/31/2022   TSH 220.000 (H) 02/27/2022   TSH 298.000 (H) 02/02/2021   TSH 263.000 (H) 07/19/2020   TSH 181.194 (H) 12/23/2019   TSH 14.02 (H) 07/15/2018   TSH 134.12 (H) 08/11/2015   TSH 32.191 (H) 10/30/2011   FREET4 1.17 12/10/2022   FREET4 1.85 (H) 08/27/2022   FREET4 1.82 (H) 05/31/2022   FREET4 0.20 (L) 02/27/2022   FREET4 0.21 (L) 02/02/2021   FREET4 0.23 (L) 07/19/2020   FREET4 0.37 (L) 12/23/2019   FREET4 1.3  07/15/2018   FREET4 0.45 (L) 11/01/2011    Lipid Panel     Component Value Date/Time   CHOL 135 12/10/2022 1334   TRIG 47 12/10/2022 1334   HDL 88 12/10/2022 1334   CHOLHDL 1.5 12/10/2022 1334   CHOLHDL 2.9 05/25/2020 0849   VLDL 12 05/25/2020 0849   LDLCALC 36 12/10/2022 1334   LDLCALC 63 07/15/2018 1111    Assessment & Plan:   1) Uncontrolled type 1 diabetes mellitus with hyperglycemia (HCC) - [name]. has currently uncontrolled symptomatic type 1 DM since 42 years of age.  He presents today, accompanied by his wife, with his Omnipod showing gross hyperglycemia overall.  POCT A1c today is 8.5%, improving from last visit of 10.4%.  He has NOT been bolusing before meals as recommended.  Analysis of his CGM shows TIR 35%, TAR 65%, TBR 0% with a GMI of 9.4%.  -his diabetes is complicated by peripheral neuropathy, tobacco use/abuse and he remains at a high risk for more acute and chronic complications which include CAD, CVA, CKD, retinopathy, and neuropathy. These are all discussed in detail with him.  - Nutritional counseling repeated at each appointment due to patients tendency to fall back in to old habits.  - The patient admits there is a room for improvement in their diet and drink choices. -  Suggestion is made for the patient to avoid simple carbohydrates from their diet including Cakes, Sweet Desserts / Pastries, Ice Cream, Soda (diet and regular), Sweet Tea, Candies, Chips, Cookies, Sweet Pastries, Store Bought Juices, Alcohol in Excess of 1-2 drinks a day, Artificial Sweeteners, Coffee Creamer, and "Sugar-free" Products. This will help patient to have stable blood glucose profile and potentially avoid unintended weight gain.   - I encouraged the patient to switch to unprocessed or minimally processed complex starch and increased protein intake (animal or plant source), fruits, and vegetables.   - Patient is  advised to stick to a routine mealtimes to eat 3 meals a day and  avoid unnecessary snacks (to snack only to correct hypoglycemia).  - he has been scheduled with Norm Salt, RDN, CDE for individualized diabetes education previously.  - I have approached him with the following individualized plan to manage diabetes and patient agrees:   -No changes will be made to his pump settings today. He is, once again, encouraged to start bolusing before meals and to stay in automatic mode as much as possible.    -He is advised to continue monitoring blood glucose at least 4 times a day (using his CGM or backup method), before meals and at bedtime.  He is also instructed to call the clinic if his blood glucose is less than 70 or greater than 200 for 3 tests in a row.  - he is warned not to take insulin without proper monitoring per orders.  -He is not a candidate for non-insulin medications for diabetes management.  - Patient specific target  A1c;  LDL, HDL, Triglycerides, and  Waist Circumference were discussed in detail.  2) Blood Pressure /Hypertension: His blood pressure is controlled to target.  He is not on any antihypertensives at this time. He will be considered for low dose ACE or ARB if BP is elevated on 3 separate visits.  3) Lipids/Hyperlipidemia:  His most recent lipid panel from 12/10/22 shows controlled LDL at 36.  He is advised to continue Lipitor 20 mg po daily at bedtime.  Side effects and precautions discussed with him.   4)  Weight/Diet:  His Body mass index is 23.57 kg/m.  He is not a candidate for weight loss.  CDE Consult is in process. Exercise, and detailed carbohydrates information provided  -  detailed on discharge instructions.  5) Hypothyroidism His previsit thyroid function tests are consistent with appropriate hormone replacement.  He is advised to continue Levothyroxine 100 mcg po daily before breakfast.     - We discussed about the correct intake of his thyroid hormone, on empty stomach at fasting, with water, separated by at  least 30 minutes from breakfast and other medications,  and separated by more than 4 hours from calcium, iron, multivitamins, acid reflux medications (PPIs). -Patient is made aware of the fact that thyroid hormone replacement is needed for life, dose to be adjusted by periodic monitoring of thyroid function tests.  6) Vitamin D deficiency: His recent vitamin D level from 12/10/22 was improved at 30.   I encouraged him to continue OTC Vitamin D 5000 units daily as maintenance dose.  7) Chronic Care/Health Maintenance: -he is not on ACEI/ARB and is on Statin medications and is encouraged to initiate and continue to follow up with Ophthalmology, Dentist,  Podiatrist at least yearly or according to recommendations, and advised to quit tobacco use/abuse. I have recommended yearly flu vaccine and pneumonia vaccine at least every 5 years; moderate intensity exercise for up to 150 minutes weekly; and  sleep for at least 7 hours a day.  - he is advised to maintain close follow up with Golden Pop, FNP for primary care needs, as well as his other providers for optimal and coordinated care.   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  He notes the Creon seems to be helping substantially, he is advised to continue.      I spent  41  minutes in the care of the  patient today including review of labs from CMP, Lipids, Thyroid Function, Hematology (current and previous including abstractions from other facilities); face-to-face time discussing  his blood glucose readings/logs, discussing hypoglycemia and hyperglycemia episodes and symptoms, medications doses, his options of short and long term treatment based on the latest standards of care / guidelines;  discussion about incorporating lifestyle medicine;  and documenting the encounter. Risk reduction counseling performed per USPSTF guidelines to reduce obesity and cardiovascular risk  factors.     Please refer to Patient Instructions for Blood Glucose Monitoring and Insulin/Medications Dosing Guide"  in media tab for additional information. Please  also refer to " Patient Self Inventory" in the Media  tab for reviewed elements of pertinent patient history.  [name]. participated in the discussions, expressed understanding, and voiced agreement with the above plans.  All questions were answered to his satisfaction. he is encouraged to contact clinic should he have any questions or concerns prior to his return visit.     Follow up plan: - Return in about 3 months (around 03/14/2023) for Diabetes F/U with A1c in office, No previsit labs, Bring meter and logs.  Ronny Bacon, [facility] [address] [place], [place] [address] Phone: [phone] Fax: [phone]   12/12/2022, 10:07 AM

## 2022-12-17 ENCOUNTER — Ambulatory Visit (HOSPITAL_COMMUNITY): Payer: 59 | Admitting: Clinical

## 2022-12-17 DIAGNOSIS — F1090 Alcohol use, unspecified, uncomplicated: Secondary | ICD-10-CM | POA: Diagnosis not present

## 2022-12-17 DIAGNOSIS — F331 Major depressive disorder, recurrent, moderate: Secondary | ICD-10-CM

## 2022-12-17 DIAGNOSIS — F419 Anxiety disorder, unspecified: Secondary | ICD-10-CM

## 2022-12-17 DIAGNOSIS — F1021 Alcohol dependence, in remission: Secondary | ICD-10-CM

## 2022-12-17 NOTE — Progress Notes (Signed)
Virtual Visit via Video Note   I connected with [name]. on 12/17/22 at 10:00 AM EDT by a video enabled telemedicine application and verified that I am speaking with the correct person using two identifiers.   Location: Patient: Home Provider: Office   I discussed the limitations of evaluation and management by telemedicine and the availability of in person appointments. The patient expressed understanding and agreed to proceed.   THERAPIST PROGRESS NOTE   Session Time: 10:00 AM-10:30 AM   Participation Level: Active   Behavioral Response: CasualAlertDepressed/Irratable   Type of Therapy: Individual Therapy   Treatment Goals addressed: Coping for MH diagnosis   Interventions: CBT, Solution Focused, Strength-based, Supportive and Social Skills Training   Summary: Kevin Cooper. is a 42 y.o. 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 The OPT therapist worked with the patient for his ongoing treatment. The OPT therapist utilized Motivational Interviewing to assist in creating therapeutic repore. The patient in the session was engaged and work in collaboration giving feedback about his triggers and symptoms over the past few weeks.The patient spoke about having less frequency with his mental health episodes. The patient spoke about buying a car which allows for the patient and his partner to have transportation.The OPT therapist utilized Cognitive Behavioral Therapy through cognitive restructuring as well as worked with the patient on coping strategies to assist in management of mood, improve coping, and improve positive thinking while empowering the patient. The OPT therapist worked with the patient on his basic health needs in the areas of sleep, eating, hygiene, and physical health.The OPT therapist worked on exercise in my control vs out of my control. The patient spoke about intent to continue to utilize his support system including a best friend and his  partner, as well as his coping including gardening and working in the yard.    Suicidal/Homicidal: Nowithout intent/plan   Therapist Response: The OPT therapist worked with the patient for the patients  scheduled session. The patient was engaged in his session and gave feedback in relation to triggers, symptoms, and behavior responses over the past few weeks. The OPT therapist worked with the patient utilizing an in session Cognitive Behavioral Therapy exercise. The patient spoke about realized benefits from involvement with med management. The patient was responsive in the session and verbalized, " I have been having episodes but they are further apart then they were". The OPT therapist talked with the patient reviewing frequency , intensity, and during of his MH episodes. The OPT therapist overviewed coping skills to assist with managing mood and giving encouragement to continue to implement to assist with leveling his mood. The patient worked in session with the OPT therapist on the in my control not in my control exercise. The patient identified progress with getting a car and being more active in using his coping skills including getting outside, yardwork, mowing, and gardening. The OPT therapist will continue treatment work with the patient in his next scheduled session.     Plan: Return again in 3 weeks.   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 Adrian Blackwater.   Patient/Guardian was advised Release of Information  must be obtained prior to any record release in order to collaborate their care with an outside provider. Patient/Guardian was advised if they have not already done so to contact the registration department to sign all necessary forms in order for [place] to release information regarding their care.     Consent: Patient/Guardian gives verbal consent for treatment and assignment of benefits for services provided during this visit. Patient/Guardian expressed understanding and agreed to proceed.    I discussed the assessment and treatment plan with the patient. The patient was provided an opportunity to ask questions and all were answered. The patient agreed with the plan and demonstrated an understanding of the instructions.   The patient was advised to call back or seek an in-person evaluation if the symptoms worsen or if the condition fails to improve as anticipated.   I provided 30 minutes of non-face-to-face time during this encounter.   Kevin Burn, LCSW   12/17/2022

## 2023-01-03 ENCOUNTER — Other Ambulatory Visit: Payer: Self-pay | Admitting: *Deleted

## 2023-01-03 DIAGNOSIS — E1065 Type 1 diabetes mellitus with hyperglycemia: Secondary | ICD-10-CM

## 2023-01-03 MED ORDER — DEXCOM G6 SENSOR MISC
3 refills | Status: DC
Start: 2023-01-03 — End: 2023-04-03

## 2023-01-03 NOTE — Telephone Encounter (Signed)
Patient left a telephone message. He is asking for a written out prescription for the Dexcom G6 sensors. This will be done sent to [facility] fo sign and then the patient will be contacted.

## 2023-01-04 ENCOUNTER — Ambulatory Visit (HOSPITAL_COMMUNITY): Payer: 59 | Admitting: Clinical

## 2023-01-10 ENCOUNTER — Encounter (HOSPITAL_COMMUNITY): Payer: Self-pay | Admitting: Psychiatry

## 2023-01-10 ENCOUNTER — Telehealth (INDEPENDENT_AMBULATORY_CARE_PROVIDER_SITE_OTHER): Payer: 59 | Admitting: Psychiatry

## 2023-01-10 DIAGNOSIS — F1021 Alcohol dependence, in remission: Secondary | ICD-10-CM

## 2023-01-10 DIAGNOSIS — F1011 Alcohol abuse, in remission: Secondary | ICD-10-CM | POA: Diagnosis not present

## 2023-01-10 DIAGNOSIS — R45851 Suicidal ideations: Secondary | ICD-10-CM | POA: Diagnosis not present

## 2023-01-10 DIAGNOSIS — E038 Other specified hypothyroidism: Secondary | ICD-10-CM

## 2023-01-10 DIAGNOSIS — F411 Generalized anxiety disorder: Secondary | ICD-10-CM | POA: Diagnosis not present

## 2023-01-10 DIAGNOSIS — F17200 Nicotine dependence, unspecified, uncomplicated: Secondary | ICD-10-CM

## 2023-01-10 DIAGNOSIS — F17220 Nicotine dependence, chewing tobacco, uncomplicated: Secondary | ICD-10-CM

## 2023-01-10 DIAGNOSIS — F332 Major depressive disorder, recurrent severe without psychotic features: Secondary | ICD-10-CM

## 2023-01-10 DIAGNOSIS — F19982 Other psychoactive substance use, unspecified with psychoactive substance-induced sleep disorder: Secondary | ICD-10-CM

## 2023-01-10 DIAGNOSIS — F410 Panic disorder [episodic paroxysmal anxiety] without agoraphobia: Secondary | ICD-10-CM

## 2023-01-10 DIAGNOSIS — Z79899 Other long term (current) drug therapy: Secondary | ICD-10-CM

## 2023-01-10 DIAGNOSIS — E1042 Type 1 diabetes mellitus with diabetic polyneuropathy: Secondary | ICD-10-CM

## 2023-01-10 MED ORDER — DESVENLAFAXINE SUCCINATE ER 100 MG PO TB24
100.0000 mg | ORAL_TABLET | Freq: Every day | ORAL | 2 refills | Status: DC
Start: 2023-01-10 — End: 2023-02-05

## 2023-01-10 MED ORDER — ARIPIPRAZOLE 5 MG PO TABS
5.0000 mg | ORAL_TABLET | Freq: Every day | ORAL | 2 refills | Status: DC
Start: 2023-01-10 — End: 2023-02-05

## 2023-01-10 MED ORDER — TRAZODONE HCL 50 MG PO TABS: 50.0000 mg | ORAL_TABLET | Freq: Every day | ORAL | 2 refills | Status: DC
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
## 2023-01-10 NOTE — Patient Instructions (Signed)
We added trazodone 25 mg (half a tablet) to your regimen today.  If you are tolerating this well you can increase to 50 mg which would be a full tablet nightly.  Never mix this with alcohol as it can be too sedating and put her at risk for not protecting your airway.  Do best to keep cutting back on alcohol generally and that should help with your sleep and mood respectively.  Keep up the good work with cutting back on caffeine and tobacco products.

## 2023-01-16 ENCOUNTER — Other Ambulatory Visit: Payer: Self-pay | Admitting: Nurse Practitioner

## 2023-02-04 ENCOUNTER — Ambulatory Visit (INDEPENDENT_AMBULATORY_CARE_PROVIDER_SITE_OTHER): Payer: 59 | Admitting: Clinical

## 2023-02-04 DIAGNOSIS — F419 Anxiety disorder, unspecified: Secondary | ICD-10-CM | POA: Diagnosis not present

## 2023-02-04 DIAGNOSIS — F1090 Alcohol use, unspecified, uncomplicated: Secondary | ICD-10-CM

## 2023-02-04 DIAGNOSIS — F1021 Alcohol dependence, in remission: Secondary | ICD-10-CM

## 2023-02-04 DIAGNOSIS — F331 Major depressive disorder, recurrent, moderate: Secondary | ICD-10-CM

## 2023-02-04 NOTE — Progress Notes (Signed)
Virtual Visit via Video Note   I connected with Kevin Cooper. on 02/04/23 at 9:00 AM EDT by a video enabled telemedicine application and verified that I am speaking with the correct person using two identifiers.   Location: Patient: Home Provider: Office   I discussed the limitations of evaluation and management by telemedicine and the availability of in person appointments. The patient expressed understanding and agreed to proceed.   THERAPIST PROGRESS NOTE   Session Time: 9:00 AM-9:30 AM   Participation Level: Active   Behavioral Response: CasualAlertDepressed/Irratable   Type of Therapy: Individual Therapy   Treatment Goals addressed: Coping for MH diagnosis   Interventions: CBT, Solution Focused, Strength-based, Supportive and Social Skills Training   Summary: Kevin Cooper. is a 42 y.o. 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 The OPT therapist worked with the patient for his ongoing treatment. The OPT therapist utilized Motivational Interviewing to assist in creating therapeutic repore. The patient in the session was engaged and work in collaboration giving feedback about his triggers and symptoms over the past few weeks.The patient spoke about being more active with better weather getting out of the home and mowing yards.The OPT therapist utilized Cognitive Behavioral Therapy through cognitive restructuring as well as worked with the patient on coping strategies to assist in management of mood, improve coping, and improve positive thinking while empowering the patient. The OPT therapist worked with the patient on his basic health needs in the areas of sleep, eating, hygiene, and physical health.The OPT therapist worked on exercise in my control vs out of my control. The patient spoke about intent to continue to utilize his support system including a best friend and his partner, as well as his coping including gardening and working in the yard.  The  patient spoke about intent to talk with Dr. Adrian Blackwater about making a slight adjustment with his mood stabilizer noting difficulty with mental fatigue and mood feeling he may need a increase as initial med benefits seemed to decrease.   Suicidal/Homicidal: Nowithout intent/plan   Therapist Response: The OPT therapist worked with the patient for the patients  scheduled session. The patient was engaged in his session and gave feedback in relation to triggers, symptoms, and behavior responses over the past few weeks. The OPT therapist worked with the patient utilizing an in session Cognitive Behavioral Therapy exercise. The patient spoke about realized benefits from involvement with med management. The patient was responsive in the session and verbalized, " I have noticed some low mood and not extremes no S/I just difficulty with mental fatigue and low mood I am thinking maybe a increase in my mood stabilizer medicine would help I am going to talk with Dr. Adrian Blackwater tomorrow at our appointment". The OPT therapist talked with the patient reviewing frequency , intensity, and during of his MH episodes. The OPT therapist overviewed coping skills to assist with managing mood and giving encouragement to continue to implement to assist with leveling his mood. The patient worked in session with the OPT therapist on the in my control not in my control exercise. The patient identified progress with getting out of the home more and being more active in using his coping skills including getting outside, yardwork, mowing, and gardening. The OPT therapist will continue treatment work with the patient in his next scheduled session.     Plan: Return again in 3 weeks.   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 Adrian Blackwater.   Patient/Guardian was  advised Release of Information must be obtained prior to any record release in order to collaborate their care with an outside provider. Patient/Guardian was advised if they have not already done so to contact the registration department to sign all necessary forms in order for [place] to release information regarding their care.    Consent: Patient/Guardian gives verbal consent for treatment and assignment of benefits for services provided during this visit. Patient/Guardian expressed understanding and agreed to proceed.    I discussed the assessment and treatment plan with the patient. The patient was provided an opportunity to ask questions and all were answered. The patient agreed with the plan and demonstrated an understanding of the instructions.   The patient was advised to call back or seek an in-person evaluation if the symptoms worsen or if the condition fails to improve as anticipated.   I provided 30 minutes of non-face-to-face time during this encounter.   [name], LCSW   02/04/2023

## 2023-02-05 ENCOUNTER — Telehealth (HOSPITAL_COMMUNITY): Payer: 59 | Admitting: Psychiatry

## 2023-02-05 ENCOUNTER — Telehealth (INDEPENDENT_AMBULATORY_CARE_PROVIDER_SITE_OTHER): Payer: 59 | Admitting: Psychiatry

## 2023-02-05 ENCOUNTER — Encounter (HOSPITAL_COMMUNITY): Payer: Self-pay | Admitting: Psychiatry

## 2023-02-05 DIAGNOSIS — F411 Generalized anxiety disorder: Secondary | ICD-10-CM | POA: Diagnosis not present

## 2023-02-05 DIAGNOSIS — F17200 Nicotine dependence, unspecified, uncomplicated: Secondary | ICD-10-CM

## 2023-02-05 DIAGNOSIS — R45851 Suicidal ideations: Secondary | ICD-10-CM | POA: Diagnosis not present

## 2023-02-05 DIAGNOSIS — F17290 Nicotine dependence, other tobacco product, uncomplicated: Secondary | ICD-10-CM

## 2023-02-05 DIAGNOSIS — F332 Major depressive disorder, recurrent severe without psychotic features: Secondary | ICD-10-CM

## 2023-02-05 DIAGNOSIS — F410 Panic disorder [episodic paroxysmal anxiety] without agoraphobia: Secondary | ICD-10-CM

## 2023-02-05 DIAGNOSIS — F19982 Other psychoactive substance use, unspecified with psychoactive substance-induced sleep disorder: Secondary | ICD-10-CM

## 2023-02-05 DIAGNOSIS — Z79899 Other long term (current) drug therapy: Secondary | ICD-10-CM

## 2023-02-05 DIAGNOSIS — F1020 Alcohol dependence, uncomplicated: Secondary | ICD-10-CM | POA: Diagnosis not present

## 2023-02-05 MED ORDER — DESVENLAFAXINE SUCCINATE ER 100 MG PO TB24
100.0000 mg | ORAL_TABLET | Freq: Every day | ORAL | 2 refills | Status: DC
Start: 2023-02-05 — End: 2023-04-02

## 2023-02-05 MED ORDER — TRAZODONE HCL 100 MG PO TABS
100.0000 mg | ORAL_TABLET | Freq: Every day | ORAL | 2 refills | Status: DC
Start: 2023-02-05 — End: 2023-04-02

## 2023-02-05 MED ORDER — ARIPIPRAZOLE 5 MG PO TABS
7.5000 mg | ORAL_TABLET | Freq: Every day | ORAL | 2 refills | Status: DC
Start: 2023-02-05 — End: 2023-04-02

## 2023-02-05 MED ORDER — HYDROXYZINE HCL 50 MG PO TABS
50.0000 mg | ORAL_TABLET | Freq: Three times a day (TID) | ORAL | 2 refills | Status: DC | PRN
Start: 2023-02-05 — End: 2023-04-02

## 2023-02-05 NOTE — Patient Instructions (Signed)
We continued the hydroxyzine at 50 mg in the morning and 100 mg at night.  We also titrated the trazodone to 100 mg nightly though in both of these medications would never mix with alcohol and I would expect with decreased alcohol use as we are planning that we could likely return to lower doses.  Similarly we will titrate the Abilify to 7.5 mg daily but this may not be needed as your life stressors decrease and alcohol use decreases as well.
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
## 2023-03-04 ENCOUNTER — Ambulatory Visit (INDEPENDENT_AMBULATORY_CARE_PROVIDER_SITE_OTHER): Payer: 59 | Admitting: Clinical

## 2023-03-04 DIAGNOSIS — F1020 Alcohol dependence, uncomplicated: Secondary | ICD-10-CM | POA: Diagnosis not present

## 2023-03-04 DIAGNOSIS — F331 Major depressive disorder, recurrent, moderate: Secondary | ICD-10-CM | POA: Diagnosis not present

## 2023-03-04 DIAGNOSIS — F419 Anxiety disorder, unspecified: Secondary | ICD-10-CM

## 2023-03-04 DIAGNOSIS — F1021 Alcohol dependence, in remission: Secondary | ICD-10-CM

## 2023-03-04 NOTE — Progress Notes (Signed)
Virtual Visit via Video Note   I connected with Kevin Krontz. on 03/04/23 at 9:00 AM EDT by a video enabled telemedicine application and verified that I am speaking with the correct person using two identifiers.   Location: Patient: Home Provider: Office   I discussed the limitations of evaluation and management by telemedicine and the availability of in person appointments. The patient expressed understanding and agreed to proceed.   THERAPIST PROGRESS NOTE   Session Time: 9:00 AM-9:30 AM   Participation Level: Active   Behavioral Response: CasualAlertDepressed/Irratable   Type of Therapy: Individual Therapy   Treatment Goals addressed: Coping for MH diagnosis   Interventions: CBT, Solution Focused, Strength-based, Supportive and Social Skills Training   Summary: Kevin Reineck. is a 42 y.o. 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 The OPT therapist worked with the patient for his ongoing treatment. The OPT therapist utilized Motivational Interviewing to assist in creating therapeutic repore. The patient in the session was engaged and work in collaboration giving feedback about his triggers and symptoms over the past few weeks.The patient spoke about recent external stressor and change of going to live with his Mother-in-Law with his partner to help her as she is having health difficulty. The patient indicated that they have been living at the Mother-in- Laws for the past 2 weeks, but should be transitioning back to there home in about 1 more week.Additionally the patient identified while there that his partners emotional support animal ran away about a week ago and they have not been able to relocate the animal. The OPT therapist utilized Cognitive Behavioral Therapy through cognitive restructuring as well as worked with the patient on coping strategies to assist in management of mood, improve coping, and improve positive thinking while empowering the patient.  The OPT therapist worked with the patient on his basic health needs in the areas of sleep, eating, hygiene, and physical health.The OPT therapist worked on exercise in my control vs out of my control. The patient spoke about intent to continue to utilize his support system including a best friend and his partner, as well as his coping. The patient noted his acknowledgement of being in a transitional state and that things should level back out once he and his partner return home so that he can get back to his baseline and with this will be able to more accurately assess how well the recent changes in his med therapy are working to manage his MH symptoms.   Suicidal/Homicidal: Nowithout intent/plan   Therapist Response: The OPT therapist worked with the patient for the patients  scheduled session. The patient was engaged in his session and gave feedback in relation to triggers, symptoms, and behavior responses over the past few weeks. The patient spoke about current acknowledged external stressors including living with his Mother-in-Law for the past 2 weeks and his partner losing her emotional support animal who ran away from the Mother-In-Laws home around a week ago. The OPT therapist worked with the patient utilizing an in session Cognitive Behavioral Therapy exercise. The patient spoke about realized benefits from involvement with med management, and felt he will notice more benefit from his medication once he transitions back to his normal home environment. The patient was responsive in the session and verbalized, " I think right now just not being at home and having to go back and forth to take care of the animals and living here for the past 2 weeks with different rules has  been a big stressor, but we should in about 1 more week be going back and living back at our home". The OPT therapist talked with the patient reviewing frequency , intensity, and during of his MH episodes. The OPT therapist overviewed  coping skills to assist with managing mood and giving encouragement to continue to implement to assist with leveling his mood. The patient worked in session with the OPT therapist on the in my control not in my control exercise. The patient identified  using his coping skills and for the most part watching movies to help him cope with not being at his baseline and at home currently. The OPT therapist will continue treatment work with the patient in his next scheduled session.     Plan: Return again in 3 weeks.   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 Adrian Blackwater.   Patient/Guardian was advised Release of Information must be obtained prior to any record release in order to collaborate their care with an outside provider. Patient/Guardian was advised if they have not already done so to contact the registration department to sign all necessary forms in order for [place] to release information regarding their care.    Consent: Patient/Guardian gives verbal consent for treatment and assignment of benefits for services provided during this visit. Patient/Guardian expressed understanding and agreed to proceed.    I discussed the assessment and treatment plan with the patient. The patient was provided an opportunity to ask questions and all were answered. The patient agreed with the plan and demonstrated an understanding of the instructions.   The patient was advised to call back or seek an in-person evaluation if the symptoms worsen or if the condition fails to improve as anticipated.   I provided 30 minutes of non-face-to-face time during this encounter.   Kevin Burn, LCSW   03/04/2023

## 2023-03-05 ENCOUNTER — Telehealth (INDEPENDENT_AMBULATORY_CARE_PROVIDER_SITE_OTHER): Payer: 59 | Admitting: Psychiatry

## 2023-03-05 ENCOUNTER — Encounter (HOSPITAL_COMMUNITY): Payer: Self-pay | Admitting: Psychiatry

## 2023-03-05 DIAGNOSIS — F19982 Other psychoactive substance use, unspecified with psychoactive substance-induced sleep disorder: Secondary | ICD-10-CM | POA: Diagnosis not present

## 2023-03-05 DIAGNOSIS — F410 Panic disorder [episodic paroxysmal anxiety] without agoraphobia: Secondary | ICD-10-CM

## 2023-03-05 DIAGNOSIS — R45851 Suicidal ideations: Secondary | ICD-10-CM | POA: Diagnosis not present

## 2023-03-05 DIAGNOSIS — F411 Generalized anxiety disorder: Secondary | ICD-10-CM

## 2023-03-05 DIAGNOSIS — F332 Major depressive disorder, recurrent severe without psychotic features: Secondary | ICD-10-CM | POA: Diagnosis not present

## 2023-03-05 DIAGNOSIS — Z79899 Other long term (current) drug therapy: Secondary | ICD-10-CM

## 2023-03-05 DIAGNOSIS — F17200 Nicotine dependence, unspecified, uncomplicated: Secondary | ICD-10-CM
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
## 2023-03-05 NOTE — Patient Instructions (Signed)
We did not make any medication changes today.  Keep up the good work with cutting back on alcohol and keep trying to reduce the amount of caffeine you are consuming daily and that should make it easier to sleep and improve the anxiety as well.

## 2023-03-14 ENCOUNTER — Ambulatory Visit: Payer: 59 | Admitting: Nurse Practitioner

## 2023-03-14 DIAGNOSIS — E1065 Type 1 diabetes mellitus with hyperglycemia: Secondary | ICD-10-CM

## 2023-03-14 DIAGNOSIS — E039 Hypothyroidism, unspecified: Secondary | ICD-10-CM

## 2023-03-14 DIAGNOSIS — E782 Mixed hyperlipidemia: Secondary | ICD-10-CM

## 2023-03-14 DIAGNOSIS — E559 Vitamin D deficiency, unspecified: Secondary | ICD-10-CM

## 2023-03-15 ENCOUNTER — Telehealth: Payer: Self-pay | Admitting: Nurse Practitioner

## 2023-03-15 NOTE — Telephone Encounter (Signed)
Patient no showed their appt.  Front office staff reached out to get patient rescheduled.

## 2023-03-25 ENCOUNTER — Ambulatory Visit: Payer: 59 | Admitting: Nurse Practitioner

## 2023-03-25 DIAGNOSIS — E1065 Type 1 diabetes mellitus with hyperglycemia: Secondary | ICD-10-CM

## 2023-03-25 DIAGNOSIS — E782 Mixed hyperlipidemia: Secondary | ICD-10-CM

## 2023-03-25 DIAGNOSIS — E039 Hypothyroidism, unspecified: Secondary | ICD-10-CM

## 2023-03-25 DIAGNOSIS — E559 Vitamin D deficiency, unspecified: Secondary | ICD-10-CM

## 2023-04-01 ENCOUNTER — Ambulatory Visit (INDEPENDENT_AMBULATORY_CARE_PROVIDER_SITE_OTHER): Payer: 59 | Admitting: Clinical

## 2023-04-01 ENCOUNTER — Telehealth: Payer: Self-pay | Admitting: Nurse Practitioner

## 2023-04-01 DIAGNOSIS — F1021 Alcohol dependence, in remission: Secondary | ICD-10-CM

## 2023-04-01 DIAGNOSIS — F419 Anxiety disorder, unspecified: Secondary | ICD-10-CM | POA: Diagnosis not present

## 2023-04-01 DIAGNOSIS — F331 Major depressive disorder, recurrent, moderate: Secondary | ICD-10-CM | POA: Diagnosis not present

## 2023-04-01 MED ORDER — DEXCOM G6 TRANSMITTER MISC: 2 refills | Status: DC

## 2023-04-01 NOTE — Telephone Encounter (Signed)
Rx sent 

## 2023-04-01 NOTE — Telephone Encounter (Signed)
Pt is requesting a new RX for Transmitter to be sent to [facility]

## 2023-04-01 NOTE — Progress Notes (Signed)
Virtual Visit via Video Note   I connected with Kevin Cooper. on 04/01/23 at 9:00 AM EDT by a video enabled telemedicine application and verified that I am speaking with the correct person using two identifiers.   Location: Patient: Home Provider: Office   I discussed the limitations of evaluation and management by telemedicine and the availability of in person appointments. The patient expressed understanding and agreed to proceed.   THERAPIST PROGRESS NOTE   Session Time: 9:00 AM-9:30 AM   Participation Level: Active   Behavioral Response: CasualAlertDepressed/Irratable   Type of Therapy: Individual Therapy   Treatment Goals addressed: Coping for MH diagnosis   Interventions: CBT, Solution Focused, Strength-based, Supportive and Social Skills Training   Summary: Kevin Cooper. is a 42 y.o. 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 The OPT therapist worked with the patient for his ongoing treatment. The OPT therapist utilized Motivational Interviewing to assist in creating therapeutic repore. The patient in the session was engaged and work in collaboration giving feedback about his triggers and symptoms over the past few weeks.The patient spoke about recent external stressor and change of still being at  Mother-in-Laws home with his partner to help her as she is having health difficulty. The patient indicated that they have been living at the [employer]  but should be transitioning back to there home in about 1 more week. The OPT therapist utilized Cognitive Behavioral Therapy through cognitive restructuring as well as worked with the patient on coping strategies to assist in management of mood, improve coping, and improve positive thinking while empowering the patient. The OPT therapist worked with the patient on his basic health needs in the areas of sleep, eating, hygiene, and physical health.The OPT therapist worked on exercise in my control vs out of  my control. The patient spoke about his work to remain consistent in working on areas he has control in with regard to his mental health.   Suicidal/Homicidal: Nowithout intent/plan   Therapist Response: The OPT therapist worked with the patient for the patients  scheduled session. The patient was engaged in his session and gave feedback in relation to triggers, symptoms, and behavior responses over the past few weeks. The patient spoke about current acknowledged external stressors including continuing to be at his Mother-in-Law for the past several weeks. The OPT therapist worked with the patient utilizing an in session Cognitive Behavioral Therapy exercise. The patient spoke about ongoing work in self check ins and actively working on areas within his control in regard to his mental health. The OPT therapist overviewed coping skills to assist with managing mood and giving encouragement to continue to implement to assist with leveling his mood. The patient noted he is again thinking he will be able to transition back home in about a week as his Mother in [place] health has been improving. The OPT therapist will continue treatment work with the patient in his next scheduled session.     Plan: Return again in 3 weeks.   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 Adrian Blackwater.   Patient/Guardian was advised Release of Information must be obtained prior to any  record release in order to collaborate their care with an outside provider. Patient/Guardian was advised if they have not already done so to contact the registration department to sign all necessary forms in order for [place] to release information regarding their care.    Consent: Patient/Guardian gives verbal consent for treatment and assignment of benefits for services  provided during this visit. Patient/Guardian expressed understanding and agreed to proceed.    I discussed the assessment and treatment plan with the patient. The patient was provided an opportunity to ask questions and all were answered. The patient agreed with the plan and demonstrated an understanding of the instructions.   The patient was advised to call back or seek an in-person evaluation if the symptoms worsen or if the condition fails to improve as anticipated.   I provided 30 minutes of non-face-to-face time during this encounter.   [name], LCSW   04/01/2023

## 2023-04-02 ENCOUNTER — Telehealth (HOSPITAL_COMMUNITY): Payer: 59 | Admitting: Psychiatry

## 2023-04-02 ENCOUNTER — Encounter (HOSPITAL_COMMUNITY): Payer: Self-pay | Admitting: Psychiatry

## 2023-04-02 ENCOUNTER — Other Ambulatory Visit: Payer: Self-pay | Admitting: Nurse Practitioner

## 2023-04-02 DIAGNOSIS — F17200 Nicotine dependence, unspecified, uncomplicated: Secondary | ICD-10-CM

## 2023-04-02 DIAGNOSIS — F19982 Other psychoactive substance use, unspecified with psychoactive substance-induced sleep disorder: Secondary | ICD-10-CM | POA: Diagnosis not present

## 2023-04-02 DIAGNOSIS — F411 Generalized anxiety disorder: Secondary | ICD-10-CM | POA: Diagnosis not present

## 2023-04-02 DIAGNOSIS — F332 Major depressive disorder, recurrent severe without psychotic features: Secondary | ICD-10-CM | POA: Diagnosis not present

## 2023-04-02 DIAGNOSIS — E1065 Type 1 diabetes mellitus with hyperglycemia: Secondary | ICD-10-CM

## 2023-04-02 DIAGNOSIS — F410 Panic disorder [episodic paroxysmal anxiety] without agoraphobia: Secondary | ICD-10-CM

## 2023-04-02 DIAGNOSIS — R45851 Suicidal ideations: Secondary | ICD-10-CM

## 2023-04-02 DIAGNOSIS — Z87898 Personal history of other specified conditions: Secondary | ICD-10-CM

## 2023-04-02 MED ORDER — TRAZODONE HCL 100 MG PO TABS
100.0000 mg | ORAL_TABLET | Freq: Every day | ORAL | 2 refills | Status: AC
Start: 2023-04-02 — End: ?

## 2023-04-02 MED ORDER — DESVENLAFAXINE SUCCINATE ER 100 MG PO TB24
100.0000 mg | ORAL_TABLET | Freq: Every day | ORAL | 2 refills | Status: AC
Start: 2023-04-02 — End: ?

## 2023-04-02 MED ORDER — ARIPIPRAZOLE 5 MG PO TABS
7.5000 mg | ORAL_TABLET | Freq: Every day | ORAL | 2 refills | Status: DC
Start: 2023-04-02 — End: 2023-09-24

## 2023-04-02 MED ORDER — HYDROXYZINE HCL 50 MG PO TABS
50.0000 mg | ORAL_TABLET | Freq: Three times a day (TID) | ORAL | 2 refills | Status: AC | PRN
Start: 2023-04-02 — End: ?
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
## 2023-04-02 NOTE — Patient Instructions (Signed)
We did not make any medication changes today.  Keep up the good work with cutting back on caffeine as best you can and cutting back on alcohol.

## 2023-04-03 MED ORDER — DEXCOM G6 SENSOR MISC: 3 refills | Status: DC

## 2023-04-03 MED ORDER — DEXCOM G6 RECEIVER DEVI: 0 refills | Status: DC

## 2023-04-11 NOTE — Telephone Encounter (Addendum)
Patient's wife called and said that [employer] sent him a email and said they need more info regarding his Transmitter. Do you have a way to check on this? I have not seen a PA for this.  She now says please send to [facility] in [place]

## 2023-04-14 ENCOUNTER — Other Ambulatory Visit: Payer: Self-pay | Admitting: Nurse Practitioner

## 2023-04-19 ENCOUNTER — Other Ambulatory Visit: Payer: Self-pay | Admitting: Nurse Practitioner

## 2023-04-29 ENCOUNTER — Encounter (HOSPITAL_COMMUNITY): Payer: Self-pay

## 2023-04-29 ENCOUNTER — Ambulatory Visit (HOSPITAL_COMMUNITY): Payer: 59 | Admitting: Clinical

## 2023-04-29 ENCOUNTER — Telehealth: Payer: Self-pay

## 2023-04-29 MED ORDER — DEXCOM G7 SENSOR MISC: 1.0000 | 3 refills | Status: DC

## 2023-04-29 MED ORDER — DEXCOM G7 RECEIVER DEVI: 1.0000 | Freq: Once | 0 refills | Status: AC

## 2023-04-29 NOTE — Telephone Encounter (Signed)
Pt called and said he got his G7 omnipods in. Can you call in the Dexcom G7 sensors and supplies for it [employer]

## 2023-04-29 NOTE — Telephone Encounter (Signed)
I sent in for the G7 sensor and receiver to [employer].  He needs to download the G7 app on his phone (using same login and password as the G6 app) and use it that way to work best with his Omnipod 5.

## 2023-05-02 ENCOUNTER — Encounter (HOSPITAL_COMMUNITY): Payer: Self-pay

## 2023-05-02 ENCOUNTER — Telehealth (HOSPITAL_COMMUNITY): Payer: 59 | Admitting: Psychiatry

## 2023-05-17 ENCOUNTER — Ambulatory Visit: Payer: 59 | Admitting: Podiatry

## 2023-05-23 ENCOUNTER — Ambulatory Visit (INDEPENDENT_AMBULATORY_CARE_PROVIDER_SITE_OTHER): Payer: 59 | Admitting: Nurse Practitioner

## 2023-05-23 ENCOUNTER — Encounter: Payer: Self-pay | Admitting: Nurse Practitioner

## 2023-05-23 VITALS — BP 110/69 | HR 101 | Ht 64.25 in | Wt 144.8 lb

## 2023-05-23 DIAGNOSIS — E1065 Type 1 diabetes mellitus with hyperglycemia: Secondary | ICD-10-CM

## 2023-05-23 DIAGNOSIS — E782 Mixed hyperlipidemia: Secondary | ICD-10-CM | POA: Diagnosis not present

## 2023-05-23 DIAGNOSIS — E039 Hypothyroidism, unspecified: Secondary | ICD-10-CM | POA: Diagnosis not present

## 2023-05-23 DIAGNOSIS — Z794 Long term (current) use of insulin: Secondary | ICD-10-CM

## 2023-05-23 DIAGNOSIS — E559 Vitamin D deficiency, unspecified: Secondary | ICD-10-CM | POA: Diagnosis not present

## 2023-05-23 LAB — POCT GLYCOSYLATED HEMOGLOBIN (HGB A1C): Hemoglobin A1C: 8.6 % — AB (ref 4.0–5.6)

## 2023-05-23 MED ORDER — LEVOTHYROXINE SODIUM 100 MCG PO TABS: 100.0000 ug | ORAL_TABLET | Freq: Every day | ORAL | 1 refills | Status: DC

## 2023-05-23 NOTE — Progress Notes (Signed)
Endocrinology follow-up note       05/23/2023, 4:05 PM   Subjective:    Patient ID: Kevin Cooper., male    DOB: Apr 23, 1981.  Kevin Duggal. is being seen in follow-up for management of currently uncontrolled symptomatic type 1 diabetes, hypothyroidism. PMD:   Golden Pop, FNP.   Past Medical History:  Diagnosis Date   Chronic right hip pain    Depression    Diabetes mellitus    diagnosed age 42   Insomnia 06/04/2022   Seizures (HCC)    last one january 2016 due to low bs   Thyroid disease    Vitamin D deficiency 06/04/2022   Past Surgical History:  Procedure Laterality Date   EYE SURGERY Right age 32   Social History   Socioeconomic History   Marital status: Legally Separated    Spouse name: Not on file   Number of children: Not on file   Years of education: Not on file   Highest education level: Not on file  Occupational History   Not on file  Tobacco Use   Smoking status: Former    Current packs/day: 0.00    Average packs/day: 2.0 packs/day for 4.0 years (8.0 ttl pk-yrs)    Types: Cigarettes    Start date: 05/29/1999    Quit date: 05/29/2003    Years since quitting: 19.9   Smokeless tobacco: Current    Types: Chew   Tobacco comments:    2 cans of dip per week.  Previously tried patches, gum, lozenges with lack of effect  Vaping Use   Vaping status: Never Used  Substance and Sexual Activity   Alcohol use: Yes    Comment: Drinking 32 oz once weekly as of 03/05/2023.  Previously drinking at least 1 shot of liquor nightly, sometimes 2-3.  Other times will have heavier episodes of drinking   Drug use: No   Sexual activity: Yes    Birth control/protection: Condom  Other Topics Concern   Not on file  Social History Narrative   Not on file   Social Drivers of Health   Financial Resource Strain: Not on file  Food Insecurity: Not on file  Transportation Needs: Not on file  Physical  Activity: Not on file  Stress: Not on file  Social Connections: Not on file   Outpatient Encounter Medications as of 05/23/2023  Medication Sig   ARIPiprazole ER (ABILIFY MAINTENA) 400 MG PRSY prefilled syringe Inject 400 mg into the muscle every 30 (thirty) days.   atorvastatin (LIPITOR) 20 MG tablet Take 1 tablet (20 mg total) by mouth daily.   baclofen (LIORESAL) 10 MG tablet Take 10 mg by mouth 3 (three) times daily.   Continuous Glucose Sensor (DEXCOM G7 SENSOR) MISC Inject 1 Application into the skin as directed. Change sensor every 10 days as directed.   Continuous Glucose Transmitter (DEXCOM G6 TRANSMITTER) MISC Change transmitter every 90 days as directed.   desvenlafaxine (PRISTIQ) 100 MG 24 hr tablet Take 1 tablet (100 mg total) by mouth daily.   hydrOXYzine (ATARAX) 50 MG tablet Take 1 tablet (50 mg total) by mouth 3 (three) times daily as needed (Panic attacks/insomnia).   Insulin Disposable  Pump (OMNIPOD 5 DEXG7G6 PODS GEN 5) MISC APPLY 1 POD AS DIRECTED AND,  REPLACE POD EVERY 48 TO 72 HOURS   insulin lispro (HUMALOG) 100 UNIT/ML injection INJECT SUBCUTANEOUSLY VIA PUMP  FOR TOTAL DAILY DOSE AROUND 50  UNITS DAILY   lidocaine (LIDODERM) 5 % Place onto the skin daily.   morphine (MSIR) 15 MG tablet Take 15 mg by mouth every 8 (eight) hours as needed for severe pain (pain score 7-10).   omeprazole (PRILOSEC) 40 MG capsule Take 40 mg by mouth daily.   pregabalin (LYRICA) 100 MG capsule Take 100 mg by mouth 3 (three) times daily.   sildenafil (VIAGRA) 50 MG tablet Take 100 mg by mouth daily as needed for erectile dysfunction.   traZODone (DESYREL) 100 MG tablet Take 1 tablet (100 mg total) by mouth at bedtime. Do not mix with alcohol ever.   ZENPEP 25000-79000 units CPEP TAKE 2 CAPSULES BY MOUTH BEFORE  BREAKFAST, LUNCH AND SUPPER.  TAKE 1 CAPSULE PRIOR TO SNACKS  FOR A TOTAL DAILY DOSE OF AROUND 9 CAPSULES   [DISCONTINUED] levothyroxine (SYNTHROID) 100 MCG tablet Take 1 tablet (100  mcg total) by mouth daily before breakfast.   ARIPiprazole (ABILIFY) 5 MG tablet Take 1.5 tablets (7.5 mg total) by mouth daily. (Patient not taking: Reported on 05/23/2023)   levothyroxine (SYNTHROID) 100 MCG tablet Take 1 tablet (100 mcg total) by mouth daily before breakfast.   [DISCONTINUED] oxyCODONE-acetaminophen (PERCOCET) 7.5-325 MG tablet Take 1 tablet by mouth every 8 (eight) hours as needed for severe pain.   No facility-administered encounter medications on file as of 05/23/2023.    ALLERGIES: Allergies  Allergen Reactions   Tomato Hives, Itching and Rash   Penicillins Other (See Comments) and Rash    UNKNOWN REACTION    VACCINATION STATUS:  There is no immunization history on file for this patient.  Diabetes He presents for his follow-up diabetic visit. He has type 1 diabetes mellitus. Onset time: He was diagnosed at approximate age of 8 years. His disease course has been fluctuating. There are no hypoglycemic associated symptoms. Pertinent negatives for hypoglycemia include no confusion, dizziness, nervousness/anxiousness or pallor. Associated symptoms include fatigue. Pertinent negatives for diabetes include no chest pain, no polydipsia, no polyphagia, no polyuria, no weakness and no weight loss. There are no hypoglycemic complications. Symptoms are stable. Diabetic complications include impotence and peripheral neuropathy. Risk factors for coronary artery disease include diabetes mellitus, tobacco exposure, sedentary lifestyle, family history, male sex and dyslipidemia. Current diabetic treatment includes insulin pump. He is compliant with treatment some of the time (does not bolus prior to meals). His weight is increasing steadily. He is following a generally unhealthy diet. Meal planning includes carbohydrate counting and ADA exchanges. He has not had a previous visit with a dietitian. He rarely participates in exercise. His home blood glucose trend is fluctuating dramatically.  His breakfast blood glucose range is generally 180-200 mg/dl. His lunch blood glucose range is generally 180-200 mg/dl. His dinner blood glucose range is generally 180-200 mg/dl. His bedtime blood glucose range is generally 180-200 mg/dl. His overall blood glucose range is 180-200 mg/dl. (He presents today, accompanied by his wife, with his Omnipod showing slowly improving glycemic profile.  His POCT A1c today is 8.6%, increasing from last visit of 8%.  He and his wife have been more consistent with bolusing (sometimes bolusing with carb count back to back to help bring down when high) which subsequently causes a low.  Analysis of his CGM shows  TIR 50%, TAR 48%, TBR 2% with a GMI of 7.9%.  ) An ACE inhibitor/angiotensin II receptor blocker is not being taken. He does not see a podiatrist.Eye exam is current.  Thyroid Problem Presents for follow-up visit. Symptoms include diarrhea and fatigue. Patient reports no anxiety, cold intolerance, constipation, heat intolerance, palpitations, weight gain or weight loss. (Joint aches) The symptoms have been stable. His past medical history is significant for diabetes.  Erectile Dysfunction This is a chronic problem. The current episode started more than 1 year ago. The problem has been gradually worsening since onset. The nature of his difficulty is achieving erection and maintaining erection. He reports no anxiety or decreased libido. Irritative symptoms do not include urgency. Pertinent negatives include no chills, dysuria or hematuria. Past treatments include nothing. Risk factors include diabetes mellitus, penile trauma and tobacco use (was stepped on by a horse as a teenager in the groin causing trauma).  Hyperlipidemia This is a chronic problem. The current episode started more than 1 year ago. The problem is uncontrolled. Recent lipid tests were reviewed and are high. Exacerbating diseases include diabetes and hypothyroidism. There are no known factors aggravating  his hyperlipidemia. Pertinent negatives include no chest pain or myalgias. Current antihyperlipidemic treatment includes statins. There are no compliance problems.  Risk factors for coronary artery disease include diabetes mellitus, dyslipidemia, male sex and a sedentary lifestyle.    Review of systems  Constitutional: + increasing body weight (good development for him),  current Body mass index is 24.66 kg/m. , no fatigue, no subjective hyperthermia, no subjective hypothermia Eyes: no blurry vision, no xerophthalmia ENT: no sore throat, no nodules palpated in throat, no dysphagia/odynophagia, no hoarseness Cardiovascular: no chest pain, no shortness of breath, no palpitations, no leg swelling Respiratory: no cough, no shortness of breath Gastrointestinal: no nausea/vomiting/diarrhea, + intermittent constipation since pain meds were changed Musculoskeletal: + generalized muscle/joint aches, walks with cane Skin: no rashes, no hyperemia Neurological: no tremors, no numbness, no tingling, no dizziness Psychiatric: no depression, no anxiety  Objective:    BP 110/69 (BP Location: Left Arm, Patient Position: Sitting, Cuff Size: Large)   Pulse (!) 101   Ht 5' 4.25" (1.632 m)   Wt 144 lb 12.8 oz (65.7 kg)   BMI 24.66 kg/m   Wt Readings from Last 3 Encounters:  05/23/23 144 lb 12.8 oz (65.7 kg)  12/12/22 138 lb 6.4 oz (62.8 kg)  08/31/22 125 lb 6.4 oz (56.9 kg)    BP Readings from Last 3 Encounters:  05/23/23 110/69  12/12/22 101/62  08/31/22 110/72     Physical Exam- Limited  Constitutional:  Body mass index is 24.66 kg/m. , not in acute distress, normal state of mind Eyes:  EOMI, no exophthalmos Musculoskeletal: no gross deformities, strength intact in all four extremities, no gross restriction of joint movements, walks with cane Skin:  no rashes, no hyperemia, extensive tattoos Neurological: no tremor with outstretched hands   Diabetic Foot Exam - Simple   No data filed      CMP ( most recent) CMP     Component Value Date/Time   NA 137 12/10/2022 1334   K 4.4 12/10/2022 1334   CL 100 12/10/2022 1334   CO2 21 12/10/2022 1334   GLUCOSE 114 (H) 12/10/2022 1334   GLUCOSE 177 (H) 05/25/2020 0849   BUN 13 12/10/2022 1334   CREATININE 0.90 12/10/2022 1334   CREATININE 0.87 07/15/2018 1111   CALCIUM 9.2 12/10/2022 1334   PROT 7.1 12/10/2022 1334  ALBUMIN 4.7 12/10/2022 1334   AST 40 12/10/2022 1334   ALT 24 12/10/2022 1334   ALKPHOS 168 (H) 12/10/2022 1334   BILITOT 0.3 12/10/2022 1334   GFRNONAA >60 05/25/2020 0849   GFRNONAA 109 07/15/2018 1111   GFRAA >60 12/23/2019 1050   GFRAA 127 07/15/2018 1111    Diabetic Labs (most recent): Lab Results  Component Value Date   HGBA1C 8.6 (A) 05/23/2023   HGBA1C 8.5 (A) 12/12/2022   HGBA1C 10.4 (A) 08/31/2022   MICROALBUR 30 10/02/2021   MICROALBUR 7.3 (H) 12/23/2019   MICROALBUR 0.3 07/15/2018     Lab Results  Component Value Date   TSH 0.937 12/10/2022   TSH 0.047 (L) 08/27/2022   TSH 0.115 (L) 05/31/2022   TSH 220.000 (H) 02/27/2022   TSH 298.000 (H) 02/02/2021   TSH 263.000 (H) 07/19/2020   TSH 181.194 (H) 12/23/2019   TSH 14.02 (H) 07/15/2018   TSH 134.12 (H) 08/11/2015   TSH 32.191 (H) 10/30/2011   FREET4 1.17 12/10/2022   FREET4 1.85 (H) 08/27/2022   FREET4 1.82 (H) 05/31/2022   FREET4 0.20 (L) 02/27/2022   FREET4 0.21 (L) 02/02/2021   FREET4 0.23 (L) 07/19/2020   FREET4 0.37 (L) 12/23/2019   FREET4 1.3 07/15/2018   FREET4 0.45 (L) 11/01/2011    Lipid Panel     Component Value Date/Time   CHOL 135 12/10/2022 1334   TRIG 47 12/10/2022 1334   HDL 88 12/10/2022 1334   CHOLHDL 1.5 12/10/2022 1334   CHOLHDL 2.9 05/25/2020 0849   VLDL 12 05/25/2020 0849   LDLCALC 36 12/10/2022 1334   LDLCALC 63 07/15/2018 1111    Assessment & Plan:   1) Uncontrolled type 1 diabetes mellitus with hyperglycemia (HCC) - Kevin Canales. has currently uncontrolled symptomatic type 1 DM since 42  years of age.  He presents today, accompanied by his wife, with his Omnipod showing slowly improving glycemic profile.  His POCT A1c today is 8.6%, increasing from last visit of 8%.  He and his wife have been more consistent with bolusing (sometimes bolusing with carb count back to back to help bring down when high) which subsequently causes a low.  Analysis of his CGM shows TIR 50%, TAR 48%, TBR 2% with a GMI of 7.9%.    -his diabetes is complicated by peripheral neuropathy, tobacco use/abuse and he remains at a high risk for more acute and chronic complications which include CAD, CVA, CKD, retinopathy, and neuropathy. These are all discussed in detail with him.  - Nutritional counseling repeated at each appointment due to patients tendency to fall back in to old habits.  - The patient admits there is a room for improvement in their diet and drink choices. -  Suggestion is made for the patient to avoid simple carbohydrates from their diet including Cakes, Sweet Desserts / Pastries, Ice Cream, Soda (diet and regular), Sweet Tea, Candies, Chips, Cookies, Sweet Pastries, Store Bought Juices, Alcohol in Excess of 1-2 drinks a day, Artificial Sweeteners, Coffee Creamer, and "Sugar-free" Products. This will help patient to have stable blood glucose profile and potentially avoid unintended weight gain.   - I encouraged the patient to switch to unprocessed or minimally processed complex starch and increased protein intake (animal or plant source), fruits, and vegetables.   - Patient is advised to stick to a routine mealtimes to eat 3 meals a day and avoid unnecessary snacks (to snack only to correct hypoglycemia).  - he has been scheduled with Norm Salt,  RDN, CDE for individualized diabetes education previously.  - I have approached him with the following individualized plan to manage diabetes and patient agrees:   -I did slightly adjust his insulin to carb ratio to 1:10. He is, once again,  encouraged to consistently bolus before meals and to stay in automatic mode as much as possible.    -He is advised to continue monitoring blood glucose at least 4 times a day (using his CGM or backup method), before meals and at bedtime.  He is also instructed to call the clinic if his blood glucose is less than 70 or greater than 200 for 3 tests in a row.  - he is warned not to take insulin without proper monitoring per orders.  -He is not a candidate for non-insulin medications for diabetes management.  - Patient specific target  A1c;  LDL, HDL, Triglycerides, and  Waist Circumference were discussed in detail.  2) Blood Pressure /Hypertension: His blood pressure is controlled to target.  He is not on any antihypertensives at this time. He will be considered for low dose ACE or ARB if BP is elevated on 3 separate visits.  3) Lipids/Hyperlipidemia:  His most recent lipid panel from 12/10/22 shows controlled LDL at 36.  He is advised to continue Lipitor 20 mg po daily at bedtime.  Side effects and precautions discussed with him.   4)  Weight/Diet:  His Body mass index is 24.66 kg/m.  He is not a candidate for weight loss.  CDE Consult is in process. Exercise, and detailed carbohydrates information provided  -  detailed on discharge instructions.  5) Hypothyroidism There are no recent TFTs to review.  He is advised to continue Levothyroxine 100 mcg po daily before breakfast.   Will recheck prior to next visit and adjust dose accordingly.  - We discussed about the correct intake of his thyroid hormone, on empty stomach at fasting, with water, separated by at least 30 minutes from breakfast and other medications,  and separated by more than 4 hours from calcium, iron, multivitamins, acid reflux medications (PPIs). -Patient is made aware of the fact that thyroid hormone replacement is needed for life, dose to be adjusted by periodic monitoring of thyroid function tests.  6) Vitamin D  deficiency: His recent vitamin D level from 12/10/22 was improved at 30.   I encouraged him to continue OTC Vitamin D 5000 units daily as maintenance dose.  7) Chronic Care/Health Maintenance: -he is not on ACEI/ARB and is on Statin medications and is encouraged to initiate and continue to follow up with Ophthalmology, Dentist,  Podiatrist at least yearly or according to recommendations, and advised to quit tobacco use/abuse. I have recommended yearly flu vaccine and pneumonia vaccine at least every 5 years; moderate intensity exercise for up to 150 minutes weekly; and  sleep for at least 7 hours a day.  - he is advised to maintain close follow up with Golden Pop, FNP for primary care needs, as well as his other providers for optimal and coordinated care.   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  He notes the Creon seems to be helping substantially, he is advised to continue.  He did lower to 1 tab as he has been experiencing a bit of constipation with his recent pain medication change.      I spent  39  minutes in the care of the patient today including review  of labs from CMP, Lipids, Thyroid Function, Hematology (current and previous including abstractions from other facilities); face-to-face time discussing  his blood glucose readings/logs, discussing hypoglycemia and hyperglycemia episodes and symptoms, medications doses, his options of short and long term treatment based on the latest standards of care / guidelines;  discussion about incorporating lifestyle medicine;  and documenting the encounter. Risk reduction counseling performed per USPSTF guidelines to reduce obesity and cardiovascular risk factors.     Please refer to Patient Instructions for Blood Glucose Monitoring and Insulin/Medications Dosing Guide"  in media tab for additional information. Please  also refer to " Patient Self Inventory" in the Media  tab  for reviewed elements of pertinent patient history.  Kevin Canales. participated in the discussions, expressed understanding, and voiced agreement with the above plans.  All questions were answered to his satisfaction. he is encouraged to contact clinic should he have any questions or concerns prior to his return visit.     Follow up plan: - Return in about 4 months (around 09/21/2023) for Diabetes F/U with A1c in office, Previsit labs, Bring meter and logs, Thyroid follow up.  Ronny Bacon, [facility] [address] [place], [place] [address] Phone: [phone] Fax: [phone]   05/23/2023, 4:05 PM

## 2023-05-30 DIAGNOSIS — R159 Full incontinence of feces: Secondary | ICD-10-CM | POA: Diagnosis not present

## 2023-05-30 DIAGNOSIS — F17210 Nicotine dependence, cigarettes, uncomplicated: Secondary | ICD-10-CM | POA: Diagnosis not present

## 2023-05-30 DIAGNOSIS — Z299 Encounter for prophylactic measures, unspecified: Secondary | ICD-10-CM | POA: Diagnosis not present

## 2023-05-30 DIAGNOSIS — F339 Major depressive disorder, recurrent, unspecified: Secondary | ICD-10-CM | POA: Diagnosis not present

## 2023-05-30 DIAGNOSIS — E1169 Type 2 diabetes mellitus with other specified complication: Secondary | ICD-10-CM | POA: Diagnosis not present

## 2023-06-03 DIAGNOSIS — M25552 Pain in left hip: Secondary | ICD-10-CM | POA: Diagnosis not present

## 2023-06-03 DIAGNOSIS — M25551 Pain in right hip: Secondary | ICD-10-CM | POA: Diagnosis not present

## 2023-06-03 DIAGNOSIS — G894 Chronic pain syndrome: Secondary | ICD-10-CM | POA: Diagnosis not present

## 2023-06-03 DIAGNOSIS — M25511 Pain in right shoulder: Secondary | ICD-10-CM | POA: Diagnosis not present

## 2023-06-03 DIAGNOSIS — M5451 Vertebrogenic low back pain: Secondary | ICD-10-CM | POA: Diagnosis not present

## 2023-06-03 DIAGNOSIS — Z79891 Long term (current) use of opiate analgesic: Secondary | ICD-10-CM | POA: Diagnosis not present

## 2023-06-03 DIAGNOSIS — M25549 Pain in joints of unspecified hand: Secondary | ICD-10-CM | POA: Diagnosis not present

## 2023-06-04 ENCOUNTER — Telehealth: Payer: Self-pay

## 2023-06-04 ENCOUNTER — Other Ambulatory Visit (HOSPITAL_COMMUNITY): Payer: Self-pay

## 2023-06-04 NOTE — Telephone Encounter (Signed)
 Pharmacy Patient Advocate Encounter   Received notification from CoverMyMeds that prior authorization for Dexcom G7 sensor is required/requested.   Insurance verification completed.   The patient is insured through [employer] .   Per test claim: PA required; PA submitted to above mentioned insurance via CoverMyMeds Key/confirmation #/EOC [id number] Status is pending

## 2023-06-07 NOTE — Telephone Encounter (Signed)
 Pharmacy Patient Advocate Encounter  Received notification from [facility] that Prior Authorization for Dexcom G7 sensor has been APPROVED through 05/27/2024   PA #/Case ID/Reference #: [id number]

## 2023-06-10 NOTE — Telephone Encounter (Signed)
 Patient was called . Unable to leave a message. Will try to call patient back to let him know of Dexcom Sensor approval.

## 2023-06-18 ENCOUNTER — Other Ambulatory Visit: Payer: Self-pay | Admitting: Nurse Practitioner

## 2023-06-18 DIAGNOSIS — E039 Hypothyroidism, unspecified: Secondary | ICD-10-CM

## 2023-06-20 DIAGNOSIS — G4730 Sleep apnea, unspecified: Secondary | ICD-10-CM | POA: Diagnosis not present

## 2023-06-20 DIAGNOSIS — Z299 Encounter for prophylactic measures, unspecified: Secondary | ICD-10-CM | POA: Diagnosis not present

## 2023-06-20 DIAGNOSIS — R278 Other lack of coordination: Secondary | ICD-10-CM | POA: Diagnosis not present

## 2023-06-25 ENCOUNTER — Telehealth: Payer: Self-pay | Admitting: Nurse Practitioner

## 2023-06-25 NOTE — Telephone Encounter (Signed)
It was sent to [facility] pharmacy and it was approved, looks like Tammy tried calling to let them know it was approved.  They need to reach out to [facility].

## 2023-06-25 NOTE — Telephone Encounter (Signed)
Pt's wife called said she still does not have an update on when pt is supposed to get his sensors. She said she is not even sure where they would be coming from? She thought [facility] but is not sure. Can you look into this? I am going to give him a sample

## 2023-07-02 DIAGNOSIS — F3130 Bipolar disorder, current episode depressed, mild or moderate severity, unspecified: Secondary | ICD-10-CM | POA: Diagnosis not present

## 2023-07-02 DIAGNOSIS — Z131 Encounter for screening for diabetes mellitus: Secondary | ICD-10-CM | POA: Diagnosis not present

## 2023-07-02 DIAGNOSIS — F429 Obsessive-compulsive disorder, unspecified: Secondary | ICD-10-CM | POA: Diagnosis not present

## 2023-07-02 DIAGNOSIS — E785 Hyperlipidemia, unspecified: Secondary | ICD-10-CM | POA: Diagnosis not present

## 2023-07-02 DIAGNOSIS — F411 Generalized anxiety disorder: Secondary | ICD-10-CM | POA: Diagnosis not present

## 2023-07-02 DIAGNOSIS — Z79899 Other long term (current) drug therapy: Secondary | ICD-10-CM | POA: Diagnosis not present

## 2023-07-02 DIAGNOSIS — F1320 Sedative, hypnotic or anxiolytic dependence, uncomplicated: Secondary | ICD-10-CM | POA: Diagnosis not present

## 2023-07-02 DIAGNOSIS — E039 Hypothyroidism, unspecified: Secondary | ICD-10-CM | POA: Diagnosis not present

## 2023-07-02 DIAGNOSIS — Z5181 Encounter for therapeutic drug level monitoring: Secondary | ICD-10-CM | POA: Diagnosis not present

## 2023-07-03 DIAGNOSIS — F3130 Bipolar disorder, current episode depressed, mild or moderate severity, unspecified: Secondary | ICD-10-CM | POA: Diagnosis not present

## 2023-07-22 DIAGNOSIS — H25813 Combined forms of age-related cataract, bilateral: Secondary | ICD-10-CM | POA: Diagnosis not present

## 2023-07-26 DIAGNOSIS — Q453 Other congenital malformations of pancreas and pancreatic duct: Secondary | ICD-10-CM | POA: Diagnosis not present

## 2023-07-26 DIAGNOSIS — Z299 Encounter for prophylactic measures, unspecified: Secondary | ICD-10-CM | POA: Diagnosis not present

## 2023-07-26 DIAGNOSIS — T402X5A Adverse effect of other opioids, initial encounter: Secondary | ICD-10-CM | POA: Diagnosis not present

## 2023-07-26 DIAGNOSIS — K5903 Drug induced constipation: Secondary | ICD-10-CM | POA: Diagnosis not present

## 2023-07-30 DIAGNOSIS — F411 Generalized anxiety disorder: Secondary | ICD-10-CM | POA: Diagnosis not present

## 2023-07-30 DIAGNOSIS — F3130 Bipolar disorder, current episode depressed, mild or moderate severity, unspecified: Secondary | ICD-10-CM | POA: Diagnosis not present

## 2023-07-30 DIAGNOSIS — F429 Obsessive-compulsive disorder, unspecified: Secondary | ICD-10-CM | POA: Diagnosis not present

## 2023-08-16 ENCOUNTER — Other Ambulatory Visit: Payer: Self-pay | Admitting: *Deleted

## 2023-08-16 DIAGNOSIS — Z794 Long term (current) use of insulin: Secondary | ICD-10-CM

## 2023-08-16 DIAGNOSIS — E1065 Type 1 diabetes mellitus with hyperglycemia: Secondary | ICD-10-CM

## 2023-08-16 MED ORDER — INSULIN LISPRO 100 UNIT/ML IJ SOLN: INTRAMUSCULAR | 2 refills | Status: DC

## 2023-08-30 DIAGNOSIS — Z5181 Encounter for therapeutic drug level monitoring: Secondary | ICD-10-CM | POA: Diagnosis not present

## 2023-08-30 DIAGNOSIS — Z79899 Other long term (current) drug therapy: Secondary | ICD-10-CM | POA: Diagnosis not present

## 2023-08-30 DIAGNOSIS — F1320 Sedative, hypnotic or anxiolytic dependence, uncomplicated: Secondary | ICD-10-CM | POA: Diagnosis not present

## 2023-08-30 DIAGNOSIS — F429 Obsessive-compulsive disorder, unspecified: Secondary | ICD-10-CM | POA: Diagnosis not present

## 2023-08-30 DIAGNOSIS — F411 Generalized anxiety disorder: Secondary | ICD-10-CM | POA: Diagnosis not present

## 2023-08-30 DIAGNOSIS — F3130 Bipolar disorder, current episode depressed, mild or moderate severity, unspecified: Secondary | ICD-10-CM | POA: Diagnosis not present

## 2023-09-10 DIAGNOSIS — F411 Generalized anxiety disorder: Secondary | ICD-10-CM | POA: Diagnosis not present

## 2023-09-10 DIAGNOSIS — F3130 Bipolar disorder, current episode depressed, mild or moderate severity, unspecified: Secondary | ICD-10-CM | POA: Diagnosis not present

## 2023-09-18 ENCOUNTER — Other Ambulatory Visit: Payer: Self-pay | Admitting: Nurse Practitioner

## 2023-09-23 DIAGNOSIS — E039 Hypothyroidism, unspecified: Secondary | ICD-10-CM | POA: Diagnosis not present

## 2023-09-23 DIAGNOSIS — E1065 Type 1 diabetes mellitus with hyperglycemia: Secondary | ICD-10-CM | POA: Diagnosis not present

## 2023-09-24 ENCOUNTER — Ambulatory Visit (INDEPENDENT_AMBULATORY_CARE_PROVIDER_SITE_OTHER): Payer: 59 | Admitting: Nurse Practitioner

## 2023-09-24 ENCOUNTER — Encounter: Payer: Self-pay | Admitting: Nurse Practitioner

## 2023-09-24 VITALS — BP 100/76 | HR 102 | Ht 64.25 in | Wt 146.8 lb

## 2023-09-24 DIAGNOSIS — E039 Hypothyroidism, unspecified: Secondary | ICD-10-CM

## 2023-09-24 DIAGNOSIS — E559 Vitamin D deficiency, unspecified: Secondary | ICD-10-CM

## 2023-09-24 DIAGNOSIS — E782 Mixed hyperlipidemia: Secondary | ICD-10-CM | POA: Diagnosis not present

## 2023-09-24 DIAGNOSIS — E1065 Type 1 diabetes mellitus with hyperglycemia: Secondary | ICD-10-CM | POA: Diagnosis not present

## 2023-09-24 DIAGNOSIS — Z794 Long term (current) use of insulin: Secondary | ICD-10-CM | POA: Diagnosis not present

## 2023-09-24 LAB — COMPREHENSIVE METABOLIC PANEL WITH GFR
ALT: 51 IU/L — ABNORMAL HIGH (ref 0–44)
AST: 50 IU/L — ABNORMAL HIGH (ref 0–40)
Albumin: 4.6 g/dL (ref 4.1–5.1)
Alkaline Phosphatase: 182 IU/L — ABNORMAL HIGH (ref 44–121)
BUN/Creatinine Ratio: 14 (ref 9–20)
BUN: 16 mg/dL (ref 6–24)
Bilirubin Total: 0.4 mg/dL (ref 0.0–1.2)
CO2: 26 mmol/L (ref 20–29)
Calcium: 9.6 mg/dL (ref 8.7–10.2)
Chloride: 97 mmol/L (ref 96–106)
Creatinine, Ser: 1.14 mg/dL (ref 0.76–1.27)
Globulin, Total: 2.3 g/dL (ref 1.5–4.5)
Glucose: 176 mg/dL — ABNORMAL HIGH (ref 70–99)
Potassium: 4.3 mmol/L (ref 3.5–5.2)
Sodium: 139 mmol/L (ref 134–144)
Total Protein: 6.9 g/dL (ref 6.0–8.5)
eGFR: 82 mL/min/{1.73_m2} (ref >59)

## 2023-09-24 LAB — POCT GLYCOSYLATED HEMOGLOBIN (HGB A1C): Hemoglobin A1C: 8.2 % — AB (ref 4.0–5.6)

## 2023-09-24 LAB — TSH: TSH: 25.2 u[IU]/mL — ABNORMAL HIGH (ref 0.450–4.500)

## 2023-09-24 LAB — T4, FREE: Free T4: 1.06 ng/dL (ref 0.82–1.77)

## 2023-09-24 NOTE — Progress Notes (Signed)
 Endocrinology follow-up note       09/24/2023, 9:48 AM   Subjective:    Patient ID: Kevin Jeppsen., male    DOB: [birth year].  Kevin Beshara Jr. is being seen in follow-up for management of currently uncontrolled symptomatic type 1 diabetes, hypothyroidism. PMD:   Ander Bame, FNP.   Past Medical History:  Diagnosis Date   Chronic right hip pain    Depression    Diabetes mellitus    diagnosed age 43   Insomnia 06/04/2022   Seizures (HCC)    last one january 2016 due to low bs   Thyroid  disease    Vitamin D  deficiency 06/04/2022   Past Surgical History:  Procedure Laterality Date   EYE SURGERY Right age 43   Social History   Socioeconomic History   Marital status: Legally Separated    Spouse name: Not on file   Number of children: Not on file   Years of education: Not on file   Highest education level: Not on file  Occupational History   Not on file  Tobacco Use   Smoking status: Former    Current packs/day: 0.00    Average packs/day: 2.0 packs/day for 4.0 years (8.0 ttl pk-yrs)    Types: Cigarettes    Start date: 05/29/1999    Quit date: 05/29/2003    Years since quitting: 20.3   Smokeless tobacco: Current    Types: Chew   Tobacco comments:    2 cans of dip per week.  Previously tried patches, gum, lozenges with lack of effect  Vaping Use   Vaping status: Never Used  Substance and Sexual Activity   Alcohol  use: Yes    Comment: Drinking 32 oz once weekly as of 03/05/2023.  Previously drinking at least 1 shot of liquor nightly, sometimes 2-3.  Other times will have heavier episodes of drinking   Drug use: No   Sexual activity: Yes    Birth control/protection: Condom  Other Topics Concern   Not on file  Social History Narrative   Not on file   Social Drivers of Health   Financial Resource Strain: Not on file  Food Insecurity: Not on file  Transportation Needs: Not on file  Physical  Activity: Not on file  Stress: Not on file  Social Connections: Not on file   Outpatient Encounter Medications as of 09/24/2023  Medication Sig   ARIPiprazole  ER (ABILIFY  MAINTENA) 400 MG PRSY prefilled syringe Inject 400 mg into the muscle every 30 (thirty) days.   atorvastatin  (LIPITOR) 20 MG tablet Take 1 tablet (20 mg total) by mouth daily.   baclofen (LIORESAL) 10 MG tablet Take 10 mg by mouth 3 (three) times daily.   Continuous Glucose Sensor (DEXCOM G7 SENSOR) MISC Inject 1 Application into the skin as directed. Change sensor every 10 days as directed.   Continuous Glucose Transmitter (DEXCOM G6 TRANSMITTER) MISC Change transmitter every 90 days as directed.   desvenlafaxine  (PRISTIQ ) 100 MG 24 hr tablet Take 1 tablet (100 mg total) by mouth daily.   hydrOXYzine  (ATARAX ) 50 MG tablet Take 1 tablet (50 mg total) by mouth 3 (three) times daily as needed (Panic attacks/insomnia).   Insulin  Disposable  Pump (OMNIPOD 5 DEXG7G6 PODS GEN 5) MISC APPLY 1 POD AS DIRECTED AND,  REPLACE POD EVERY 48 TO 72 HOURS   insulin  lispro (HUMALOG ) 100 UNIT/ML injection INJECT SUBCUTANEOUSLY VIA PUMP  FOR TOTAL DAILY DOSE AROUND 50  UNITS DAILY   levothyroxine  (SYNTHROID ) 100 MCG tablet TAKE 1 TABLET BY MOUTH DAILY BEFORE BREAKFAST   lidocaine (LIDODERM) 5 % Place onto the skin daily.   omeprazole (PRILOSEC) 40 MG capsule Take 40 mg by mouth daily.   Pancrelipase , Lip-Prot-Amyl, (CREON ) 24000-76000 units CPEP TAKE 2 CAPSULES BEFORE BREAKFAST, LUNCH AND SUPPER. TAKE 1 CAPSULE PRIOR TO SNACKS. AROUND 9 CAPSULES DAILY   pregabalin (LYRICA) 100 MG capsule Take 100 mg by mouth 3 (three) times daily.   sildenafil (VIAGRA) 50 MG tablet Take 100 mg by mouth daily as needed for erectile dysfunction.   traZODone  (DESYREL ) 100 MG tablet Take 1 tablet (100 mg total) by mouth at bedtime. Do not mix with alcohol  ever.   ZENPEP  25000-79000 units CPEP TAKE 2 CAPSULES BY MOUTH BEFORE  BREAKFAST, LUNCH AND SUPPER.  TAKE 1  CAPSULE PRIOR TO SNACKS  FOR A TOTAL DAILY DOSE OF AROUND 9 CAPSULES   [DISCONTINUED] ARIPiprazole  (ABILIFY ) 5 MG tablet Take 1.5 tablets (7.5 mg total) by mouth daily. (Patient not taking: Reported on 05/23/2023)   [DISCONTINUED] morphine (MSIR) 15 MG tablet Take 15 mg by mouth every 8 (eight) hours as needed for severe pain (pain score 7-10).   No facility-administered encounter medications on file as of 09/24/2023.    ALLERGIES: Allergies  Allergen Reactions   Tomato Hives, Itching and Rash   Penicillins Other (See Comments) and Rash    UNKNOWN REACTION    VACCINATION STATUS:  There is no immunization history on file for this patient.  Diabetes He presents for his follow-up diabetic visit. He has type 1 diabetes mellitus. Onset time: He was diagnosed at approximate age of 8 years. His disease course has been fluctuating. There are no hypoglycemic associated symptoms. Pertinent negatives for hypoglycemia include no confusion, dizziness, nervousness/anxiousness or pallor. Associated symptoms include fatigue. Pertinent negatives for diabetes include no chest pain, no polydipsia, no polyphagia, no polyuria, no weakness and no weight loss. There are no hypoglycemic complications. Symptoms are stable. Diabetic complications include impotence and peripheral neuropathy. Risk factors for coronary artery disease include diabetes mellitus, tobacco exposure, sedentary lifestyle, family history, male sex and dyslipidemia. Current diabetic treatment includes insulin  pump. He is compliant with treatment some of the time (does not bolus prior to meals). His weight is increasing steadily. He is following a generally unhealthy diet. Meal planning includes carbohydrate counting and ADA exchanges. He has not had a previous visit with a dietitian. He rarely participates in exercise. His home blood glucose trend is decreasing steadily. His breakfast blood glucose range is generally 180-200 mg/dl. His lunch blood  glucose range is generally 180-200 mg/dl. His dinner blood glucose range is generally 180-200 mg/dl. His bedtime blood glucose range is generally 180-200 mg/dl. His overall blood glucose range is 180-200 mg/dl. (He presents today, accompanied by his wife, with his Omnipod showing slowly improving glycemic profile.  His POCT A1c today is 8.2%, improving from last visit of 8.6%. Analysis of his CGM shows TIR 57%, TAR 42%, TBR 1% with a GMI of 7.7%.  He has been having some drops right after spikes, meaning he may benefit from a setting change on his pump.) An ACE inhibitor/angiotensin II receptor blocker is not being taken. He does not see a  podiatrist.Eye exam is current.  Thyroid  Problem Presents for follow-up visit. Symptoms include diarrhea and fatigue. Patient reports no anxiety, cold intolerance, constipation, heat intolerance, palpitations, weight gain or weight loss. (Joint aches) The symptoms have been stable. His past medical history is significant for diabetes.  Erectile Dysfunction This is a chronic problem. The current episode started more than 1 year ago. The problem has been gradually worsening since onset. The nature of his difficulty is achieving erection and maintaining erection. He reports no anxiety or decreased libido. Irritative symptoms do not include urgency. Pertinent negatives include no chills, dysuria or hematuria. Past treatments include nothing. Risk factors include diabetes mellitus, penile trauma and tobacco use (was stepped on by a horse as a teenager in the groin causing trauma).  Hyperlipidemia This is a chronic problem. The current episode started more than 1 year ago. The problem is uncontrolled. Recent lipid tests were reviewed and are high. Exacerbating diseases include diabetes and hypothyroidism. There are no known factors aggravating his hyperlipidemia. Pertinent negatives include no chest pain or myalgias. Current antihyperlipidemic treatment includes statins. There are  no compliance problems.  Risk factors for coronary artery disease include diabetes mellitus, dyslipidemia, male sex and a sedentary lifestyle.    Review of systems  Constitutional: + increasing body weight (good development for him),  current Body mass index is 25 kg/m. , no fatigue, no subjective hyperthermia, no subjective hypothermia Eyes: no blurry vision, no xerophthalmia ENT: no sore throat, no nodules palpated in throat, no dysphagia/odynophagia, no hoarseness Cardiovascular: no chest pain, no shortness of breath, no palpitations, no leg swelling Respiratory: no cough, no shortness of breath Gastrointestinal: no nausea/vomiting, intermittent diarrhea (if forgets his Creon ) Musculoskeletal: + generalized muscle/joint aches- he discontinued his MSIR on his own, walks with cane Skin: no rashes, no hyperemia Neurological: no tremors, no numbness, no tingling, no dizziness Psychiatric: no depression, no anxiety  Objective:    BP 100/76 (BP Location: Left Arm, Patient Position: Sitting, Cuff Size: Large)   Pulse (!) 102   Ht 5' 4.25" (1.632 m)   Wt 146 lb 12.8 oz (66.6 kg)   BMI 25.00 kg/m   Wt Readings from Last 3 Encounters:  09/24/23 146 lb 12.8 oz (66.6 kg)  05/23/23 144 lb 12.8 oz (65.7 kg)  12/12/22 138 lb 6.4 oz (62.8 kg)    BP Readings from Last 3 Encounters:  09/24/23 100/76  05/23/23 110/69  12/12/22 101/62     Physical Exam- Limited  Constitutional:  Body mass index is 25 kg/m. , not in acute distress, normal state of mind Eyes:  EOMI, no exophthalmos Musculoskeletal: no gross deformities, strength intact in all four extremities, no gross restriction of joint movements, walks with cane Skin:  no rashes, no hyperemia, extensive tattoos Neurological: no tremor with outstretched hands   Diabetic Foot Exam - Simple   No data filed     CMP ( most recent) CMP     Component Value Date/Time   NA 139 09/23/2023 1400   K 4.3 09/23/2023 1400   CL 97  09/23/2023 1400   CO2 26 09/23/2023 1400   GLUCOSE 176 (H) 09/23/2023 1400   GLUCOSE 177 (H) 05/25/2020 0849   BUN 16 09/23/2023 1400   CREATININE 1.14 09/23/2023 1400   CREATININE 0.87 07/15/2018 1111   CALCIUM  9.6 09/23/2023 1400   PROT 6.9 09/23/2023 1400   ALBUMIN 4.6 09/23/2023 1400   AST 50 (H) 09/23/2023 1400   ALT 51 (H) 09/23/2023 1400   ALKPHOS  182 (H) 09/23/2023 1400   BILITOT 0.4 09/23/2023 1400   GFRNONAA >60 05/25/2020 0849   GFRNONAA 109 07/15/2018 1111   GFRAA >60 12/23/2019 1050   GFRAA 127 07/15/2018 1111    Diabetic Labs (most recent): Lab Results  Component Value Date   HGBA1C 8.2 (A) 09/24/2023   HGBA1C 8.6 (A) 05/23/2023   HGBA1C 8.5 (A) 12/12/2022   MICROALBUR 30 10/02/2021   MICROALBUR 7.3 (H) 12/23/2019   MICROALBUR 0.3 07/15/2018     Lab Results  Component Value Date   TSH 25.200 (H) 09/23/2023   TSH 0.937 12/10/2022   TSH 0.047 (L) 08/27/2022   TSH 0.115 (L) 05/31/2022   TSH 220.000 (H) 02/27/2022   TSH 298.000 (H) 02/02/2021   TSH 263.000 (H) 07/19/2020   TSH 181.194 (H) 12/23/2019   TSH 14.02 (H) 07/15/2018   TSH 134.12 (H) 08/11/2015   FREET4 1.06 09/23/2023   FREET4 1.17 12/10/2022   FREET4 1.85 (H) 08/27/2022   FREET4 1.82 (H) 05/31/2022   FREET4 0.20 (L) 02/27/2022   FREET4 0.21 (L) 02/02/2021   FREET4 0.23 (L) 07/19/2020   FREET4 0.37 (L) 12/23/2019   FREET4 1.3 07/15/2018   FREET4 0.45 (L) 11/01/2011    Lipid Panel     Component Value Date/Time   CHOL 135 12/10/2022 1334   TRIG 47 12/10/2022 1334   HDL 88 12/10/2022 1334   CHOLHDL 1.5 12/10/2022 1334   CHOLHDL 2.9 05/25/2020 0849   VLDL 12 05/25/2020 0849   LDLCALC 36 12/10/2022 1334   LDLCALC 63 07/15/2018 1111    Assessment & Plan:   1) Uncontrolled type 1 diabetes mellitus with hyperglycemia (HCC) - Kevin Barn. has currently uncontrolled symptomatic type 1 DM since 43 years of age.  He presents today, accompanied by his wife, with his Omnipod showing  slowly improving glycemic profile.  His POCT A1c today is 8.2%, improving from last visit of 8.6%. Analysis of his CGM shows TIR 57%, TAR 42%, TBR 1% with a GMI of 7.7%.  He has been having some drops right after spikes, meaning he may benefit from a setting change on his pump.   -his diabetes is complicated by peripheral neuropathy, tobacco use/abuse and he remains at a high risk for more acute and chronic complications which include CAD, CVA, CKD, retinopathy, and neuropathy. These are all discussed in detail with him.  - Nutritional counseling repeated at each appointment due to patients tendency to fall back in to old habits.  - The patient admits there is a room for improvement in their diet and drink choices. -  Suggestion is made for the patient to avoid simple carbohydrates from their diet including Cakes, Sweet Desserts / Pastries, Ice Cream, Soda (diet and regular), Sweet Tea, Candies, Chips, Cookies, Sweet Pastries, Store Bought Juices, Alcohol  in Excess of 1-2 drinks a day, Artificial Sweeteners, Coffee Creamer, and "Sugar-free" Products. This will help patient to have stable blood glucose profile and potentially avoid unintended weight gain.   - I encouraged the patient to switch to unprocessed or minimally processed complex starch and increased protein intake (animal or plant source), fruits, and vegetables.   - Patient is advised to stick to a routine mealtimes to eat 3 meals a day and avoid unnecessary snacks (to snack only to correct hypoglycemia).  - he has been scheduled with Penny Crumpton, RDN, CDE for individualized diabetes education previously.  - I have approached him with the following individualized plan to manage diabetes and patient agrees:   -  I did slightly adjust his insulin  to carb ratio to 1:12. He is, once again, encouraged to consistently bolus before meals and to stay in automatic mode as much as possible.    -He is advised to continue monitoring blood glucose  at least 4 times a day (using his CGM or backup method), before meals and at bedtime.  He is also instructed to call the clinic if his blood glucose is less than 70 or greater than 200 for 3 tests in a row.  - he is warned not to take insulin  without proper monitoring per orders.  -He is not a candidate for non-insulin  medications for diabetes management.  - Patient specific target  A1c;  LDL, HDL, Triglycerides, and  Waist Circumference were discussed in detail.  2) Blood Pressure /Hypertension: His blood pressure is controlled to target.  He is not on any antihypertensives at this time. He will be considered for low dose ACE or ARB if BP is elevated on 3 separate visits.  3) Lipids/Hyperlipidemia:  His most recent lipid panel from 12/10/22 shows controlled LDL at 36.  He is advised to continue Lipitor 20 mg po daily at bedtime.  Side effects and precautions discussed with him.   4)  Weight/Diet:  His Body mass index is 25 kg/m.  He is not a candidate for weight loss.  CDE Consult is in process. Exercise, and detailed carbohydrates information provided  -  detailed on discharge instructions.  5) Hypothyroidism His previsit TFTs are consistent with slight under-replacement but he admits he has been taking his Levothyroxine  with his other morning meds, so this is likely due to malabsorption.  He is advised to continue Levothyroxine  100 mcg po daily before breakfast.     - We discussed about the correct intake of his thyroid  hormone, on empty stomach at fasting, with water, separated by at least 30 minutes from breakfast and other medications,  and separated by more than 4 hours from calcium , iron, multivitamins, acid reflux medications (PPIs). -Patient is made aware of the fact that thyroid  hormone replacement is needed for life, dose to be adjusted by periodic monitoring of thyroid  function tests.  6) Vitamin D  deficiency: His recent vitamin D  level from 12/10/22 was improved at 30.   I  encouraged him to continue OTC Vitamin D  5000 units daily as maintenance dose.  7) Chronic Care/Health Maintenance: -he is not on ACEI/ARB and is on Statin medications and is encouraged to initiate and continue to follow up with Ophthalmology, Dentist,  Podiatrist at least yearly or according to recommendations, and advised to quit tobacco use/abuse. I have recommended yearly flu vaccine and pneumonia vaccine at least every 5 years; moderate intensity exercise for up to 150 minutes weekly; and  sleep for at least 7 hours a day.  - he is advised to maintain close follow up with Ander Bame, FNP for primary care needs, as well as his other providers for optimal and coordinated care.   I did encourage him to reach out to his PCP regarding elevated LFTs.     I spent  35  minutes in the care of the patient today including review of labs from CMP, Lipids, Thyroid  Function, Hematology (current and previous including abstractions from other facilities); face-to-face time discussing  his blood glucose readings/logs, discussing hypoglycemia and hyperglycemia episodes and symptoms, medications doses, his options of short and long term treatment based on the latest standards of care / guidelines;  discussion about incorporating lifestyle medicine;  and documenting the encounter. Risk reduction counseling performed per USPSTF guidelines to reduce obesity and cardiovascular risk factors.     Please refer to Patient Instructions for Blood Glucose Monitoring and Insulin /Medications Dosing Guide"  in media tab for additional information. Please  also refer to " Patient Self Inventory" in the Media  tab for reviewed elements of pertinent patient history.  Kevin Barn. participated in the discussions, expressed understanding, and voiced agreement with the above plans.  All questions were answered to his satisfaction. he is encouraged to contact clinic should he have any questions or concerns prior to his  return visit.     Follow up plan: - Return in about 4 months (around 01/24/2024) for Diabetes F/U with A1c in office, No previsit labs, Bring meter and logs.  Hulon Magic, [facility] [address] [place], [place] [address] Phone: [phone] Fax: [phone]   09/24/2023, 9:48 AM

## 2023-09-24 NOTE — Patient Instructions (Signed)

## 2023-10-25 DIAGNOSIS — F1320 Sedative, hypnotic or anxiolytic dependence, uncomplicated: Secondary | ICD-10-CM | POA: Diagnosis not present

## 2023-10-25 DIAGNOSIS — F411 Generalized anxiety disorder: Secondary | ICD-10-CM | POA: Diagnosis not present

## 2023-10-25 DIAGNOSIS — Z299 Encounter for prophylactic measures, unspecified: Secondary | ICD-10-CM | POA: Diagnosis not present

## 2023-10-25 DIAGNOSIS — F3130 Bipolar disorder, current episode depressed, mild or moderate severity, unspecified: Secondary | ICD-10-CM | POA: Diagnosis not present

## 2023-10-25 DIAGNOSIS — J069 Acute upper respiratory infection, unspecified: Secondary | ICD-10-CM | POA: Diagnosis not present

## 2023-10-25 DIAGNOSIS — Z79899 Other long term (current) drug therapy: Secondary | ICD-10-CM | POA: Diagnosis not present

## 2023-10-25 DIAGNOSIS — F17210 Nicotine dependence, cigarettes, uncomplicated: Secondary | ICD-10-CM | POA: Diagnosis not present

## 2023-10-25 DIAGNOSIS — F429 Obsessive-compulsive disorder, unspecified: Secondary | ICD-10-CM | POA: Diagnosis not present

## 2023-10-25 DIAGNOSIS — E1169 Type 2 diabetes mellitus with other specified complication: Secondary | ICD-10-CM | POA: Diagnosis not present

## 2023-10-25 DIAGNOSIS — Z5181 Encounter for therapeutic drug level monitoring: Secondary | ICD-10-CM | POA: Diagnosis not present

## 2023-11-05 DIAGNOSIS — F3130 Bipolar disorder, current episode depressed, mild or moderate severity, unspecified: Secondary | ICD-10-CM | POA: Diagnosis not present

## 2023-11-05 DIAGNOSIS — F411 Generalized anxiety disorder: Secondary | ICD-10-CM | POA: Diagnosis not present

## 2023-11-22 ENCOUNTER — Other Ambulatory Visit: Payer: Self-pay | Admitting: Nurse Practitioner

## 2023-12-20 DIAGNOSIS — Z79899 Other long term (current) drug therapy: Secondary | ICD-10-CM | POA: Diagnosis not present

## 2023-12-20 DIAGNOSIS — F1120 Opioid dependence, uncomplicated: Secondary | ICD-10-CM | POA: Diagnosis not present

## 2023-12-20 DIAGNOSIS — F429 Obsessive-compulsive disorder, unspecified: Secondary | ICD-10-CM | POA: Diagnosis not present

## 2023-12-20 DIAGNOSIS — F3130 Bipolar disorder, current episode depressed, mild or moderate severity, unspecified: Secondary | ICD-10-CM | POA: Diagnosis not present

## 2023-12-20 DIAGNOSIS — F411 Generalized anxiety disorder: Secondary | ICD-10-CM | POA: Diagnosis not present

## 2023-12-20 DIAGNOSIS — Z79891 Long term (current) use of opiate analgesic: Secondary | ICD-10-CM | POA: Diagnosis not present

## 2023-12-20 DIAGNOSIS — F1320 Sedative, hypnotic or anxiolytic dependence, uncomplicated: Secondary | ICD-10-CM | POA: Diagnosis not present

## 2023-12-20 DIAGNOSIS — Z5181 Encounter for therapeutic drug level monitoring: Secondary | ICD-10-CM | POA: Diagnosis not present

## 2024-01-09 DIAGNOSIS — F429 Obsessive-compulsive disorder, unspecified: Secondary | ICD-10-CM | POA: Diagnosis not present

## 2024-01-09 DIAGNOSIS — F411 Generalized anxiety disorder: Secondary | ICD-10-CM | POA: Diagnosis not present

## 2024-01-09 DIAGNOSIS — F3130 Bipolar disorder, current episode depressed, mild or moderate severity, unspecified: Secondary | ICD-10-CM | POA: Diagnosis not present

## 2024-01-17 ENCOUNTER — Other Ambulatory Visit: Payer: Self-pay | Admitting: Nurse Practitioner

## 2024-01-17 DIAGNOSIS — Z794 Long term (current) use of insulin: Secondary | ICD-10-CM

## 2024-01-17 DIAGNOSIS — E1065 Type 1 diabetes mellitus with hyperglycemia: Secondary | ICD-10-CM

## 2024-01-28 ENCOUNTER — Ambulatory Visit (INDEPENDENT_AMBULATORY_CARE_PROVIDER_SITE_OTHER): Admitting: Nurse Practitioner

## 2024-01-28 ENCOUNTER — Encounter: Payer: Self-pay | Admitting: Nurse Practitioner

## 2024-01-28 VITALS — BP 100/64 | HR 76 | Ht 64.25 in | Wt 155.6 lb

## 2024-01-28 DIAGNOSIS — Z794 Long term (current) use of insulin: Secondary | ICD-10-CM

## 2024-01-28 DIAGNOSIS — E559 Vitamin D deficiency, unspecified: Secondary | ICD-10-CM | POA: Diagnosis not present

## 2024-01-28 DIAGNOSIS — E1065 Type 1 diabetes mellitus with hyperglycemia: Secondary | ICD-10-CM | POA: Diagnosis not present

## 2024-01-28 DIAGNOSIS — E782 Mixed hyperlipidemia: Secondary | ICD-10-CM | POA: Diagnosis not present

## 2024-01-28 DIAGNOSIS — E039 Hypothyroidism, unspecified: Secondary | ICD-10-CM

## 2024-01-28 LAB — POCT GLYCOSYLATED HEMOGLOBIN (HGB A1C): Hemoglobin A1C: 7.8 % — AB (ref 4.0–5.6)

## 2024-01-28 LAB — POCT UA - MICROALBUMIN: Microalbumin Ur, POC: 30 mg/L

## 2024-01-28 MED ORDER — DEXCOM G7 SENSOR MISC: 1.0000 | 3 refills | Status: AC

## 2024-01-28 MED ORDER — OMNIPOD 5 DEXG7G6 PODS GEN 5 MISC: 2 refills | Status: AC

## 2024-01-28 MED ORDER — LEVOTHYROXINE SODIUM 100 MCG PO TABS: 100.0000 ug | ORAL_TABLET | Freq: Every day | ORAL | 1 refills | Status: AC

## 2024-01-28 NOTE — Patient Instructions (Signed)

## 2024-01-28 NOTE — Progress Notes (Signed)
 Endocrinology follow-up note       01/28/2024, 9:46 AM   Subjective:    Patient ID: Kevin Peed., male    DOB: 1980/10/14.  Kevin Brooke Jr. is being seen in follow-up for management of currently uncontrolled symptomatic type 1 diabetes, hypothyroidism. PMD:   Leavy Waddell NOVAK, FNP.   Past Medical History:  Diagnosis Date   Chronic right hip pain    Depression    Diabetes mellitus    diagnosed age 43   Insomnia 06/04/2022   Seizures (HCC)    last one january 2016 due to low bs   Thyroid  disease    Vitamin D  deficiency 06/04/2022   Past Surgical History:  Procedure Laterality Date   EYE SURGERY Right age 89   Social History   Socioeconomic History   Marital status: Legally Separated    Spouse name: Not on file   Number of children: Not on file   Years of education: Not on file   Highest education level: Not on file  Occupational History   Not on file  Tobacco Use   Smoking status: Former    Current packs/day: 0.00    Average packs/day: 2.0 packs/day for 4.0 years (8.0 ttl pk-yrs)    Types: Cigarettes    Start date: 05/29/1999    Quit date: 05/29/2003    Years since quitting: 20.6   Smokeless tobacco: Current    Types: Chew   Tobacco comments:    2 cans of dip per week.  Previously tried patches, gum, lozenges with lack of effect  Vaping Use   Vaping status: Never Used  Substance and Sexual Activity   Alcohol  use: Yes    Comment: Drinking 32 oz once weekly as of 03/05/2023.  Previously drinking at least 1 shot of liquor nightly, sometimes 2-3.  Other times will have heavier episodes of drinking   Drug use: No   Sexual activity: Yes    Birth control/protection: Condom  Other Topics Concern   Not on file  Social History Narrative   Not on file   Social Drivers of Health   Financial Resource Strain: Not on file  Food Insecurity: Not on file  Transportation Needs: Not on file  Physical  Activity: Not on file  Stress: Not on file  Social Connections: Not on file   Outpatient Encounter Medications as of 01/28/2024  Medication Sig   ARIPiprazole  ER (ABILIFY  MAINTENA) 400 MG PRSY prefilled syringe Inject 400 mg into the muscle every 30 (thirty) days.   atorvastatin  (LIPITOR) 20 MG tablet Take 1 tablet (20 mg total) by mouth daily.   Continuous Glucose Receiver (DEXCOM G7 RECEIVER) DEVI by Does not apply route.   desvenlafaxine  (PRISTIQ ) 100 MG 24 hr tablet Take 1 tablet (100 mg total) by mouth daily.   eszopiclone (LUNESTA) 1 MG TABS tablet Take 1 mg by mouth at bedtime as needed for sleep. Take immediately before bedtime   hydrOXYzine  (ATARAX ) 50 MG tablet Take 1 tablet (50 mg total) by mouth 3 (three) times daily as needed (Panic attacks/insomnia).   insulin  lispro (HUMALOG ) 100 UNIT/ML injection INJECT SUBCUTANEOUSLY VIA PUMP FOR TOTALDAILY DOSE AROUND 50 UNITS DAILY   omeprazole (PRILOSEC)  40 MG capsule Take 40 mg by mouth daily.   Pancrelipase , Lip-Prot-Amyl, (CREON ) 24000-76000 units CPEP TAKE 2 CAPSULES BEFORE BREAKFAST, LUNCH AND SUPPER. TAKE 1 CAPSULE PRIOR TO SNACKS. AROUND 9 CAPSULES DAILY   pregabalin (LYRICA) 100 MG capsule Take 100 mg by mouth 3 (three) times daily.   sildenafil (VIAGRA) 50 MG tablet Take 100 mg by mouth daily as needed for erectile dysfunction.   traZODone  (DESYREL ) 100 MG tablet Take 1 tablet (100 mg total) by mouth at bedtime. Do not mix with alcohol  ever.   ZENPEP  25000-79000 units CPEP TAKE 2 CAPSULES BY MOUTH BEFORE  BREAKFAST, LUNCH AND SUPPER.  TAKE 1 CAPSULE PRIOR TO SNACKS  FOR A TOTAL DAILY DOSE OF AROUND 9 CAPSULES   [DISCONTINUED] Continuous Glucose Sensor (DEXCOM G7 SENSOR) MISC Inject 1 Application into the skin as directed. Change sensor every 10 days as directed.   [DISCONTINUED] Insulin  Disposable Pump (OMNIPOD 5 DEXG7G6 PODS GEN 5) MISC APPLY 1 POD AS DIRECETED AND REPLACE PODEVERY 48 TO 72 HOURS   [DISCONTINUED] levothyroxine   (SYNTHROID ) 100 MCG tablet TAKE 1 TABLET BY MOUTH DAILY BEFORE BREAKFAST   baclofen (LIORESAL) 10 MG tablet Take 10 mg by mouth 3 (three) times daily. (Patient not taking: Reported on 01/28/2024)   Continuous Glucose Sensor (DEXCOM G7 SENSOR) MISC Inject 1 Application into the skin as directed. Change sensor every 10 days as directed.   Insulin  Disposable Pump (OMNIPOD 5 DEXG7G6 PODS GEN 5) MISC APPLY 1 POD AS DIRECETED AND REPLACE PODEVERY 48 TO 72 HOURS   levothyroxine  (SYNTHROID ) 100 MCG tablet Take 1 tablet (100 mcg total) by mouth daily before breakfast.   lidocaine (LIDODERM) 5 % Place onto the skin daily. (Patient not taking: Reported on 01/28/2024)   [DISCONTINUED] Continuous Glucose Transmitter (DEXCOM G6 TRANSMITTER) MISC Change transmitter every 90 days as directed. (Patient not taking: Reported on 01/28/2024)   [DISCONTINUED] insulin  lispro (HUMALOG ) 100 UNIT/ML injection INJECT SUBCUTANEOUSLY VIA PUMP  FOR TOTAL DAILY DOSE AROUND 50  UNITS DAILY   No facility-administered encounter medications on file as of 01/28/2024.    ALLERGIES: Allergies  Allergen Reactions   Tomato Hives, Itching and Rash   Penicillins Other (See Comments) and Rash    UNKNOWN REACTION    VACCINATION STATUS:  There is no immunization history on file for this patient.  Diabetes He presents for his follow-up diabetic visit. He has type 1 diabetes mellitus. Onset time: He was diagnosed at approximate age of 8 years. His disease course has been fluctuating. There are no hypoglycemic associated symptoms. Pertinent negatives for hypoglycemia include no confusion, dizziness or pallor. Associated symptoms include fatigue. Pertinent negatives for diabetes include no polydipsia, no polyphagia, no polyuria, no weakness and no weight loss. There are no hypoglycemic complications. Symptoms are stable. Diabetic complications include impotence and peripheral neuropathy. Risk factors for coronary artery disease include diabetes  mellitus, tobacco exposure, sedentary lifestyle, family history, male sex and dyslipidemia. Current diabetic treatment includes insulin  pump. He is compliant with treatment most of the time (forgets to premeal bolus at times). His weight is increasing steadily. He is following a generally unhealthy diet. Meal planning includes carbohydrate counting and ADA exchanges. He has not had a previous visit with a dietitian. He rarely participates in exercise. His home blood glucose trend is decreasing steadily. His breakfast blood glucose range is generally 180-200 mg/dl. His lunch blood glucose range is generally 180-200 mg/dl. His dinner blood glucose range is generally 180-200 mg/dl. His bedtime blood glucose range is  generally 180-200 mg/dl. His overall blood glucose range is 180-200 mg/dl. (He presents today, accompanied by his wife, with his Omnipod showing slowly improving glycemic profile.  His POCT A1c today is 7.8%, improving from last visit of 8.2%. Analysis of his CGM shows TIR 49%, TAR 50%, TBR 1%.  He has been better about premeal bolusing but still not doing it as consistently as needed.) An ACE inhibitor/angiotensin II receptor blocker is not being taken. He does not see a podiatrist.Eye exam is current.  Thyroid  Problem Presents for follow-up visit. Symptoms include fatigue. Patient reports no cold intolerance, constipation, diarrhea, heat intolerance, palpitations, weight gain or weight loss. (Joint aches) The symptoms have been stable.    Review of systems  Constitutional: + increasing body weight (good development for him),  current Body mass index is 26.5 kg/m. , no fatigue, no subjective hyperthermia, no subjective hypothermia Eyes: no blurry vision, no xerophthalmia ENT: no sore throat, no nodules palpated in throat, no dysphagia/odynophagia, no hoarseness Cardiovascular: no chest pain, no shortness of breath, no palpitations, no leg swelling Respiratory: no cough, no shortness of  breath Gastrointestinal: no nausea/vomiting, intermittent diarrhea (if forgets his Creon ) Musculoskeletal: + generalized muscle/joint aches, walks with cane Skin: no rashes, no hyperemia, busted blister to right forefinger Neurological: no tremors, no numbness, no tingling, no dizziness Psychiatric: no depression, no anxiety  Objective:    BP 100/64 (BP Location: Right Arm, Patient Position: Sitting, Cuff Size: Large)   Pulse 76   Ht 5' 4.25 (1.632 m)   Wt 155 lb 9.6 oz (70.6 kg)   BMI 26.50 kg/m   Wt Readings from Last 3 Encounters:  01/28/24 155 lb 9.6 oz (70.6 kg)  09/24/23 146 lb 12.8 oz (66.6 kg)  05/23/23 144 lb 12.8 oz (65.7 kg)    BP Readings from Last 3 Encounters:  01/28/24 100/64  09/24/23 100/76  05/23/23 110/69     Physical Exam- Limited  Constitutional:  Body mass index is 26.5 kg/m. , not in acute distress, normal state of mind Eyes:  EOMI, no exophthalmos Musculoskeletal: no gross deformities, strength intact in all four extremities, no gross restriction of joint movements, walks with cane Skin:  no rashes, no hyperemia, extensive tattoos, busted blister to right forefinger- no signs of infection Neurological: no tremor with outstretched hands   Diabetic Foot Exam - Simple   Simple Foot Form Visual Inspection See comments: Yes Sensation Testing Intact to touch and monofilament testing bilaterally: Yes Pulse Check Posterior Tibialis and Dorsalis pulse intact bilaterally: Yes Comments Onychomycosis bilaterally, dry flaky nails that fall off     CMP ( most recent) CMP     Component Value Date/Time   NA 139 09/23/2023 1400   K 4.3 09/23/2023 1400   CL 97 09/23/2023 1400   CO2 26 09/23/2023 1400   GLUCOSE 176 (H) 09/23/2023 1400   GLUCOSE 177 (H) 05/25/2020 0849   BUN 16 09/23/2023 1400   CREATININE 1.14 09/23/2023 1400   CREATININE 0.87 07/15/2018 1111   CALCIUM  9.6 09/23/2023 1400   PROT 6.9 09/23/2023 1400   ALBUMIN 4.6 09/23/2023 1400    AST 50 (H) 09/23/2023 1400   ALT 51 (H) 09/23/2023 1400   ALKPHOS 182 (H) 09/23/2023 1400   BILITOT 0.4 09/23/2023 1400   GFRNONAA >60 05/25/2020 0849   GFRNONAA 109 07/15/2018 1111   GFRAA >60 12/23/2019 1050   GFRAA 127 07/15/2018 1111    Diabetic Labs (most recent): Lab Results  Component Value Date   HGBA1C  7.8 (A) 01/28/2024   HGBA1C 8.2 (A) 09/24/2023   HGBA1C 8.6 (A) 05/23/2023   MICROALBUR 30 mg/L 01/28/2024   MICROALBUR 30 10/02/2021   MICROALBUR 7.3 (H) 12/23/2019     Lab Results  Component Value Date   TSH 25.200 (H) 09/23/2023   TSH 0.937 12/10/2022   TSH 0.047 (L) 08/27/2022   TSH 0.115 (L) 05/31/2022   TSH 220.000 (H) 02/27/2022   TSH 298.000 (H) 02/02/2021   TSH 263.000 (H) 07/19/2020   TSH 181.194 (H) 12/23/2019   TSH 14.02 (H) 07/15/2018   TSH 134.12 (H) 08/11/2015   FREET4 1.06 09/23/2023   FREET4 1.17 12/10/2022   FREET4 1.85 (H) 08/27/2022   FREET4 1.82 (H) 05/31/2022   FREET4 0.20 (L) 02/27/2022   FREET4 0.21 (L) 02/02/2021   FREET4 0.23 (L) 07/19/2020   FREET4 0.37 (L) 12/23/2019   FREET4 1.3 07/15/2018   FREET4 0.45 (L) 11/01/2011    Lipid Panel     Component Value Date/Time   CHOL 135 12/10/2022 1334   TRIG 47 12/10/2022 1334   HDL 88 12/10/2022 1334   CHOLHDL 1.5 12/10/2022 1334   CHOLHDL 2.9 05/25/2020 0849   VLDL 12 05/25/2020 0849   LDLCALC 36 12/10/2022 1334   LDLCALC 63 07/15/2018 1111    Assessment & Plan:   1) Uncontrolled type 1 diabetes mellitus with hyperglycemia (HCC) - Kevin Merlynn Raddle. has currently uncontrolled symptomatic type 1 DM since 43 years of age.  He presents today, accompanied by his wife, with his Omnipod showing slowly improving glycemic profile.  His POCT A1c today is 8.2%, improving from last visit of 8.6%. Analysis of his CGM shows TIR 57%, TAR 42%, TBR 1% with a GMI of 7.7%.  He has been having some drops right after spikes, meaning he may benefit from a setting change on his pump.   -his diabetes  is complicated by peripheral neuropathy, tobacco use/abuse and he remains at a high risk for more acute and chronic complications which include CAD, CVA, CKD, retinopathy, and neuropathy. These are all discussed in detail with him.  - Nutritional counseling repeated at each appointment due to patients tendency to fall back in to old habits.  - The patient admits there is a room for improvement in their diet and drink choices. -  Suggestion is made for the patient to avoid simple carbohydrates from their diet including Cakes, Sweet Desserts / Pastries, Ice Cream, Soda (diet and regular), Sweet Tea, Candies, Chips, Cookies, Sweet Pastries, Store Bought Juices, Alcohol  in Excess of 1-2 drinks a day, Artificial Sweeteners, Coffee Creamer, and Sugar-free Products. This will help patient to have stable blood glucose profile and potentially avoid unintended weight gain.   - I encouraged the patient to switch to unprocessed or minimally processed complex starch and increased protein intake (animal or plant source), fruits, and vegetables.   - Patient is advised to stick to a routine mealtimes to eat 3 meals a day and avoid unnecessary snacks (to snack only to correct hypoglycemia).  - he has been scheduled with Penny Crumpton, RDN, CDE for individualized diabetes education previously.  - I have approached him with the following individualized plan to manage diabetes and patient agrees:   -I did not make any setting adjustments today.  I did go over the custom foods feature to help him be more consistent with premeal bolusing.    -He is advised to continue monitoring blood glucose at least 4 times a day (using his CGM or backup  method), before meals and at bedtime.  He is also instructed to call the clinic if his blood glucose is less than 70 or greater than 200 for 3 tests in a row.  - he is warned not to take insulin  without proper monitoring per orders.  -He is not a candidate for non-insulin   medications for diabetes management.  - Patient specific target  A1c;  LDL, HDL, Triglycerides, and  Waist Circumference were discussed in detail.  2) Blood Pressure /Hypertension: His blood pressure is controlled to target.  He is not on any antihypertensives at this time. He will be considered for low dose ACE or ARB if BP is elevated on 3 separate visits.  3) Lipids/Hyperlipidemia:  His most recent lipid panel from 12/10/22 shows controlled LDL at 36.  He is advised to continue Lipitor 20 mg po daily at bedtime.  Side effects and precautions discussed with him.   Will check lipid panel prior to next visit.  4)  Weight/Diet:  His Body mass index is 26.5 kg/m.  He is not a candidate for weight loss.  CDE Consult is in process. Exercise, and detailed carbohydrates information provided  -  detailed on discharge instructions.  5) Hypothyroidism There are no recent TFTs to review.  He is advised to continue Levothyroxine  100 mcg po daily before breakfast.  Will recheck prior to next visit and adjust dose accordingly.   - We discussed about the correct intake of his thyroid  hormone, on empty stomach at fasting, with water, separated by at least 30 minutes from breakfast and other medications,  and separated by more than 4 hours from calcium , iron, multivitamins, acid reflux medications (PPIs). -Patient is made aware of the fact that thyroid  hormone replacement is needed for life, dose to be adjusted by periodic monitoring of thyroid  function tests.  6) Vitamin D  deficiency: His recent vitamin D  level from 12/10/22 was improved at 30.   I encouraged him to continue OTC Vitamin D  5000 units daily as maintenance dose.  7) Chronic Care/Health Maintenance: -he is not on ACEI/ARB and is on Statin medications and is encouraged to initiate and continue to follow up with Ophthalmology, Dentist,  Podiatrist at least yearly or according to recommendations, and advised to quit tobacco use/abuse. I have  recommended yearly flu vaccine and pneumonia vaccine at least every 5 years; moderate intensity exercise for up to 150 minutes weekly; and  sleep for at least 7 hours a day.  - he is advised to maintain close follow up with Leavy Waddell NOVAK, FNP for primary care needs, as well as his other providers for optimal and coordinated care.   I did encourage him to reach out to his PCP regarding elevated LFTs.    I spent  41  minutes in the care of the patient today including review of labs from CMP, Lipids, Thyroid  Function, Hematology (current and previous including abstractions from other facilities); face-to-face time discussing  his blood glucose readings/logs, discussing hypoglycemia and hyperglycemia episodes and symptoms, medications doses, his options of short and long term treatment based on the latest standards of care / guidelines;  discussion about incorporating lifestyle medicine;  and documenting the encounter. Risk reduction counseling performed per USPSTF guidelines to reduce obesity and cardiovascular risk factors.     Please refer to Patient Instructions for Blood Glucose Monitoring and Insulin /Medications Dosing Guide  in media tab for additional information. Please  also refer to  Patient Self Inventory in the Media  tab for reviewed  elements of pertinent patient history.  Kevin Merlynn Raddle. participated in the discussions, expressed understanding, and voiced agreement with the above plans.  All questions were answered to his satisfaction. he is encouraged to contact clinic should he have any questions or concerns prior to his return visit.     Follow up plan: - Return in about 4 months (around 05/29/2024) for Diabetes F/U with A1c in office, Previsit labs, Thyroid  follow up, Bring meter and logs.  Benton Rio, [facility] [address] [place], [place] [address] Phone: [phone] Fax: [phone]   01/28/2024, 9:46 AM

## 2024-01-29 DIAGNOSIS — Z299 Encounter for prophylactic measures, unspecified: Secondary | ICD-10-CM | POA: Diagnosis not present

## 2024-01-29 DIAGNOSIS — E1169 Type 2 diabetes mellitus with other specified complication: Secondary | ICD-10-CM | POA: Diagnosis not present

## 2024-01-29 DIAGNOSIS — N529 Male erectile dysfunction, unspecified: Secondary | ICD-10-CM | POA: Diagnosis not present

## 2024-02-16 ENCOUNTER — Other Ambulatory Visit: Payer: Self-pay | Admitting: Nurse Practitioner

## 2024-02-21 DIAGNOSIS — F1320 Sedative, hypnotic or anxiolytic dependence, uncomplicated: Secondary | ICD-10-CM | POA: Diagnosis not present

## 2024-02-21 DIAGNOSIS — F411 Generalized anxiety disorder: Secondary | ICD-10-CM | POA: Diagnosis not present

## 2024-02-21 DIAGNOSIS — F3130 Bipolar disorder, current episode depressed, mild or moderate severity, unspecified: Secondary | ICD-10-CM | POA: Diagnosis not present

## 2024-02-21 DIAGNOSIS — Z79899 Other long term (current) drug therapy: Secondary | ICD-10-CM | POA: Diagnosis not present

## 2024-02-21 DIAGNOSIS — Z5181 Encounter for therapeutic drug level monitoring: Secondary | ICD-10-CM | POA: Diagnosis not present

## 2024-02-21 DIAGNOSIS — F429 Obsessive-compulsive disorder, unspecified: Secondary | ICD-10-CM | POA: Diagnosis not present

## 2024-03-26 DIAGNOSIS — Z79899 Other long term (current) drug therapy: Secondary | ICD-10-CM | POA: Diagnosis not present

## 2024-03-26 DIAGNOSIS — Z299 Encounter for prophylactic measures, unspecified: Secondary | ICD-10-CM | POA: Diagnosis not present

## 2024-03-26 DIAGNOSIS — R5383 Other fatigue: Secondary | ICD-10-CM | POA: Diagnosis not present

## 2024-03-26 DIAGNOSIS — Z1331 Encounter for screening for depression: Secondary | ICD-10-CM | POA: Diagnosis not present

## 2024-03-26 DIAGNOSIS — Z1339 Encounter for screening examination for other mental health and behavioral disorders: Secondary | ICD-10-CM | POA: Diagnosis not present

## 2024-03-26 DIAGNOSIS — E7800 Pure hypercholesterolemia, unspecified: Secondary | ICD-10-CM | POA: Diagnosis not present

## 2024-03-26 DIAGNOSIS — Z0000 Encounter for general adult medical examination without abnormal findings: Secondary | ICD-10-CM | POA: Diagnosis not present

## 2024-03-26 DIAGNOSIS — Z7189 Other specified counseling: Secondary | ICD-10-CM | POA: Diagnosis not present

## 2024-03-27 LAB — LAB REPORT - SCANNED
EGFR: 84
TSH: 11.4 — AB (ref 0.41–5.90)

## 2024-04-06 DIAGNOSIS — F411 Generalized anxiety disorder: Secondary | ICD-10-CM | POA: Diagnosis not present

## 2024-04-06 DIAGNOSIS — E1169 Type 2 diabetes mellitus with other specified complication: Secondary | ICD-10-CM | POA: Diagnosis not present

## 2024-04-06 DIAGNOSIS — F331 Major depressive disorder, recurrent, moderate: Secondary | ICD-10-CM | POA: Diagnosis not present

## 2024-04-06 DIAGNOSIS — F429 Obsessive-compulsive disorder, unspecified: Secondary | ICD-10-CM | POA: Diagnosis not present

## 2024-04-06 DIAGNOSIS — Z23 Encounter for immunization: Secondary | ICD-10-CM | POA: Diagnosis not present

## 2024-04-17 DIAGNOSIS — F3130 Bipolar disorder, current episode depressed, mild or moderate severity, unspecified: Secondary | ICD-10-CM | POA: Diagnosis not present

## 2024-04-17 DIAGNOSIS — F1320 Sedative, hypnotic or anxiolytic dependence, uncomplicated: Secondary | ICD-10-CM | POA: Diagnosis not present

## 2024-04-17 DIAGNOSIS — F429 Obsessive-compulsive disorder, unspecified: Secondary | ICD-10-CM | POA: Diagnosis not present

## 2024-04-17 DIAGNOSIS — Z79899 Other long term (current) drug therapy: Secondary | ICD-10-CM | POA: Diagnosis not present

## 2024-04-17 DIAGNOSIS — Z5181 Encounter for therapeutic drug level monitoring: Secondary | ICD-10-CM | POA: Diagnosis not present

## 2024-04-27 DIAGNOSIS — Z5181 Encounter for therapeutic drug level monitoring: Secondary | ICD-10-CM | POA: Diagnosis not present

## 2024-04-27 DIAGNOSIS — F1320 Sedative, hypnotic or anxiolytic dependence, uncomplicated: Secondary | ICD-10-CM | POA: Diagnosis not present

## 2024-04-27 DIAGNOSIS — F331 Major depressive disorder, recurrent, moderate: Secondary | ICD-10-CM | POA: Diagnosis not present

## 2024-04-27 DIAGNOSIS — Z79899 Other long term (current) drug therapy: Secondary | ICD-10-CM | POA: Diagnosis not present

## 2024-04-27 DIAGNOSIS — F429 Obsessive-compulsive disorder, unspecified: Secondary | ICD-10-CM | POA: Diagnosis not present

## 2024-04-27 DIAGNOSIS — F3130 Bipolar disorder, current episode depressed, mild or moderate severity, unspecified: Secondary | ICD-10-CM | POA: Diagnosis not present

## 2024-04-29 DIAGNOSIS — F1320 Sedative, hypnotic or anxiolytic dependence, uncomplicated: Secondary | ICD-10-CM | POA: Diagnosis not present

## 2024-04-29 DIAGNOSIS — F3130 Bipolar disorder, current episode depressed, mild or moderate severity, unspecified: Secondary | ICD-10-CM | POA: Diagnosis not present

## 2024-04-29 DIAGNOSIS — Z79899 Other long term (current) drug therapy: Secondary | ICD-10-CM | POA: Diagnosis not present

## 2024-04-29 DIAGNOSIS — F331 Major depressive disorder, recurrent, moderate: Secondary | ICD-10-CM | POA: Diagnosis not present

## 2024-04-29 DIAGNOSIS — F429 Obsessive-compulsive disorder, unspecified: Secondary | ICD-10-CM | POA: Diagnosis not present

## 2024-04-29 DIAGNOSIS — Z5181 Encounter for therapeutic drug level monitoring: Secondary | ICD-10-CM | POA: Diagnosis not present

## 2024-04-29 DIAGNOSIS — F411 Generalized anxiety disorder: Secondary | ICD-10-CM | POA: Diagnosis not present

## 2024-05-04 DIAGNOSIS — Z5181 Encounter for therapeutic drug level monitoring: Secondary | ICD-10-CM | POA: Diagnosis not present

## 2024-05-04 DIAGNOSIS — F1320 Sedative, hypnotic or anxiolytic dependence, uncomplicated: Secondary | ICD-10-CM | POA: Diagnosis not present

## 2024-05-29 ENCOUNTER — Ambulatory Visit: Admitting: Nurse Practitioner

## 2024-05-29 DIAGNOSIS — E039 Hypothyroidism, unspecified: Secondary | ICD-10-CM

## 2024-05-29 DIAGNOSIS — E782 Mixed hyperlipidemia: Secondary | ICD-10-CM

## 2024-05-29 DIAGNOSIS — E1065 Type 1 diabetes mellitus with hyperglycemia: Secondary | ICD-10-CM

## 2024-05-29 DIAGNOSIS — E559 Vitamin D deficiency, unspecified: Secondary | ICD-10-CM

## 2024-05-29 DIAGNOSIS — Z794 Long term (current) use of insulin: Secondary | ICD-10-CM

## 2024-08-25 ENCOUNTER — Ambulatory Visit: Admitting: Nurse Practitioner
